# Patient Record
Sex: Male | Born: 1950 | Race: White | Hispanic: No | Marital: Married | Smoking: Current every day smoker
Health system: Southern US, Community
[De-identification: ages and names within clinical notes are randomized; demographics above are authoritative.]

## PROBLEM LIST (undated history)

## (undated) DIAGNOSIS — N410 Acute prostatitis: Principal | ICD-10-CM

## (undated) DIAGNOSIS — J189 Pneumonia, unspecified organism: Secondary | ICD-10-CM

## (undated) DIAGNOSIS — T4145XA Adverse effect of unspecified anesthetic, initial encounter: Secondary | ICD-10-CM

## (undated) DIAGNOSIS — T7840XA Allergy, unspecified, initial encounter: Secondary | ICD-10-CM

## (undated) DIAGNOSIS — Z800 Family history of malignant neoplasm of digestive organs: Secondary | ICD-10-CM

## (undated) DIAGNOSIS — C439 Malignant melanoma of skin, unspecified: Secondary | ICD-10-CM

## (undated) DIAGNOSIS — Z86010 Personal history of colonic polyps: Secondary | ICD-10-CM

## (undated) DIAGNOSIS — H9190 Unspecified hearing loss, unspecified ear: Secondary | ICD-10-CM

## (undated) DIAGNOSIS — N200 Calculus of kidney: Secondary | ICD-10-CM

## (undated) DIAGNOSIS — F17200 Nicotine dependence, unspecified, uncomplicated: Secondary | ICD-10-CM

## (undated) DIAGNOSIS — E785 Hyperlipidemia, unspecified: Secondary | ICD-10-CM

## (undated) DIAGNOSIS — T8859XA Other complications of anesthesia, initial encounter: Secondary | ICD-10-CM

## (undated) DIAGNOSIS — N182 Chronic kidney disease, stage 2 (mild): Secondary | ICD-10-CM

## (undated) DIAGNOSIS — Z122 Encounter for screening for malignant neoplasm of respiratory organs: Secondary | ICD-10-CM

## (undated) DIAGNOSIS — I2510 Atherosclerotic heart disease of native coronary artery without angina pectoris: Secondary | ICD-10-CM

## (undated) DIAGNOSIS — Z87442 Personal history of urinary calculi: Secondary | ICD-10-CM

## (undated) DIAGNOSIS — J449 Chronic obstructive pulmonary disease, unspecified: Secondary | ICD-10-CM

## (undated) DIAGNOSIS — IMO0001 Reserved for inherently not codable concepts without codable children: Secondary | ICD-10-CM

## (undated) DIAGNOSIS — K5792 Diverticulitis of intestine, part unspecified, without perforation or abscess without bleeding: Secondary | ICD-10-CM

## (undated) DIAGNOSIS — C4491 Basal cell carcinoma of skin, unspecified: Secondary | ICD-10-CM

## (undated) DIAGNOSIS — G971 Other reaction to spinal and lumbar puncture: Secondary | ICD-10-CM

## (undated) DIAGNOSIS — M1990 Unspecified osteoarthritis, unspecified site: Secondary | ICD-10-CM

## (undated) HISTORY — DX: Acute prostatitis: N41.0

## (undated) HISTORY — DX: Reserved for inherently not codable concepts without codable children: IMO0001

## (undated) HISTORY — DX: Diverticulitis of intestine, part unspecified, without perforation or abscess without bleeding: K57.92

## (undated) HISTORY — DX: Chronic kidney disease, stage 2 (mild): N18.2

## (undated) HISTORY — DX: Allergy, unspecified, initial encounter: T78.40XA

## (undated) HISTORY — DX: Atherosclerotic heart disease of native coronary artery without angina pectoris: I25.10

## (undated) HISTORY — DX: Family history of malignant neoplasm of digestive organs: Z80.0

## (undated) HISTORY — DX: Calculus of kidney: N20.0

## (undated) HISTORY — PX: COLONOSCOPY: SHX174

## (undated) HISTORY — DX: Nicotine dependence, unspecified, uncomplicated: F17.200

## (undated) HISTORY — DX: Chronic obstructive pulmonary disease, unspecified: J44.9

## (undated) HISTORY — DX: Encounter for screening for malignant neoplasm of respiratory organs: Z12.2

## (undated) HISTORY — DX: Personal history of colonic polyps: Z86.010

## (undated) HISTORY — PX: CERVICAL FUSION: SHX112

## (undated) HISTORY — DX: Malignant melanoma of skin, unspecified: C43.9

## (undated) HISTORY — PX: LITHOTRIPSY: SUR834

## (undated) HISTORY — DX: Unspecified hearing loss, unspecified ear: H91.90

## (undated) HISTORY — DX: Hyperlipidemia, unspecified: E78.5

## (undated) HISTORY — DX: Basal cell carcinoma of skin, unspecified: C44.91

## (undated) HISTORY — PX: JOINT REPLACEMENT: SHX530

---

## 1982-08-04 HISTORY — PX: INGUINAL HERNIA REPAIR: SUR1180

## 1985-08-04 HISTORY — PX: CHOLECYSTECTOMY: SHX55

## 1987-08-05 HISTORY — PX: NECK SURGERY: SHX720

## 1995-08-05 HISTORY — PX: KNEE SURGERY: SHX244

## 2003-08-05 HISTORY — PX: OTHER SURGICAL HISTORY: SHX169

## 2010-12-24 DIAGNOSIS — Z860101 Personal history of adenomatous and serrated colon polyps: Secondary | ICD-10-CM

## 2010-12-24 DIAGNOSIS — Z86010 Personal history of colonic polyps: Secondary | ICD-10-CM

## 2010-12-24 HISTORY — DX: Personal history of colonic polyps: Z86.010

## 2010-12-24 HISTORY — DX: Personal history of adenomatous and serrated colon polyps: Z86.0101

## 2013-08-04 HISTORY — PX: KNEE SURGERY: SHX244

## 2015-01-03 DIAGNOSIS — N410 Acute prostatitis: Secondary | ICD-10-CM

## 2015-01-03 HISTORY — DX: Acute prostatitis: N41.0

## 2015-01-31 ENCOUNTER — Ambulatory Visit: Payer: Self-pay | Admitting: Family Medicine

## 2015-01-31 ENCOUNTER — Ambulatory Visit (INDEPENDENT_AMBULATORY_CARE_PROVIDER_SITE_OTHER): Payer: Medicare Other | Admitting: Family Medicine

## 2015-01-31 ENCOUNTER — Encounter: Payer: Self-pay | Admitting: Family Medicine

## 2015-01-31 VITALS — BP 126/85 | HR 82 | Temp 98.0°F | Resp 16 | Ht 73.0 in | Wt 262.0 lb

## 2015-01-31 DIAGNOSIS — N419 Inflammatory disease of prostate, unspecified: Secondary | ICD-10-CM

## 2015-01-31 DIAGNOSIS — S3994XA Unspecified injury of external genitals, initial encounter: Secondary | ICD-10-CM

## 2015-01-31 DIAGNOSIS — R300 Dysuria: Secondary | ICD-10-CM

## 2015-01-31 DIAGNOSIS — N320 Bladder-neck obstruction: Secondary | ICD-10-CM | POA: Diagnosis not present

## 2015-01-31 DIAGNOSIS — S39848A Other specified injuries of external genitals, initial encounter: Secondary | ICD-10-CM | POA: Diagnosis not present

## 2015-01-31 LAB — POCT URINALYSIS DIPSTICK
Bilirubin, UA: NEGATIVE
GLUCOSE UA: NEGATIVE
Ketones, UA: NEGATIVE
Nitrite, UA: NEGATIVE
Protein, UA: NEGATIVE
Spec Grav, UA: 1.025
Urobilinogen, UA: 0.2
pH, UA: 6

## 2015-01-31 LAB — CBC WITH DIFFERENTIAL/PLATELET
Basophils Absolute: 0 10*3/uL (ref 0.0–0.1)
Basophils Relative: 0.3 % (ref 0.0–3.0)
EOS ABS: 0.3 10*3/uL (ref 0.0–0.7)
EOS PCT: 3.4 % (ref 0.0–5.0)
HEMATOCRIT: 44.3 % (ref 39.0–52.0)
Hemoglobin: 14.7 g/dL (ref 13.0–17.0)
LYMPHS ABS: 2.4 10*3/uL (ref 0.7–4.0)
LYMPHS PCT: 24.6 % (ref 12.0–46.0)
MCHC: 33.2 g/dL (ref 30.0–36.0)
MCV: 89.6 fl (ref 78.0–100.0)
MONOS PCT: 7.8 % (ref 3.0–12.0)
Monocytes Absolute: 0.7 10*3/uL (ref 0.1–1.0)
NEUTROS PCT: 63.9 % (ref 43.0–77.0)
Neutro Abs: 6.2 10*3/uL (ref 1.4–7.7)
PLATELETS: 273 10*3/uL (ref 150.0–400.0)
RBC: 4.94 Mil/uL (ref 4.22–5.81)
RDW: 13.6 % (ref 11.5–15.5)
WBC: 9.6 10*3/uL (ref 4.0–10.5)

## 2015-01-31 LAB — COMPREHENSIVE METABOLIC PANEL
ALBUMIN: 4.2 g/dL (ref 3.5–5.2)
ALT: 11 U/L (ref 0–53)
AST: 11 U/L (ref 0–37)
Alkaline Phosphatase: 56 U/L (ref 39–117)
BUN: 15 mg/dL (ref 6–23)
CALCIUM: 9.7 mg/dL (ref 8.4–10.5)
CHLORIDE: 104 meq/L (ref 96–112)
CO2: 31 mEq/L (ref 19–32)
Creatinine, Ser: 1.2 mg/dL (ref 0.40–1.50)
GFR: 64.51 mL/min (ref >60.00)
Glucose, Bld: 105 mg/dL — ABNORMAL HIGH (ref 70–99)
POTASSIUM: 4.6 meq/L (ref 3.5–5.1)
Sodium: 141 mEq/L (ref 135–145)
TOTAL PROTEIN: 6.8 g/dL (ref 6.0–8.3)
Total Bilirubin: 0.4 mg/dL (ref 0.2–1.2)

## 2015-01-31 MED ORDER — SULFAMETHOXAZOLE-TRIMETHOPRIM 800-160 MG PO TABS: 1.0000 | ORAL_TABLET | Freq: Two times a day (BID) | ORAL | Status: DC
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
## 2015-01-31 NOTE — Progress Notes (Signed)
Pre visit review using our clinic review tool, if applicable. No additional management support is needed unless otherwise documented below in the visit note. 

## 2015-02-01 LAB — URINE CULTURE
Colony Count: NO GROWTH
ORGANISM ID, BACTERIA: NO GROWTH

## 2015-02-13 DIAGNOSIS — Z125 Encounter for screening for malignant neoplasm of prostate: Secondary | ICD-10-CM | POA: Diagnosis not present

## 2015-02-13 DIAGNOSIS — N400 Enlarged prostate without lower urinary tract symptoms: Secondary | ICD-10-CM | POA: Diagnosis not present

## 2015-02-13 DIAGNOSIS — Z87442 Personal history of urinary calculi: Secondary | ICD-10-CM | POA: Diagnosis not present

## 2015-02-13 DIAGNOSIS — R102 Pelvic and perineal pain: Secondary | ICD-10-CM | POA: Diagnosis not present

## 2015-02-14 ENCOUNTER — Encounter: Payer: Self-pay | Admitting: Family Medicine

## 2015-02-14 ENCOUNTER — Ambulatory Visit (INDEPENDENT_AMBULATORY_CARE_PROVIDER_SITE_OTHER): Payer: Medicare Other | Admitting: Family Medicine

## 2015-02-14 VITALS — BP 112/78 | HR 91 | Temp 97.5°F | Resp 16 | Ht 73.0 in | Wt 259.0 lb

## 2015-02-14 DIAGNOSIS — N410 Acute prostatitis: Secondary | ICD-10-CM | POA: Diagnosis not present

## 2015-02-14 NOTE — Progress Notes (Signed)
OFFICE NOTE  02/14/2015  CC:  Chief Complaint  Patient presents with  . Follow-up    2 week f/u. Pt is fastng.    HPI: Patient is a 64 y.o. Caucasian male who is here for 2 week f/u for issues with prostatitis with bladder outlet obstruction sx's s/p penile trauma. I started him on abx  (bactrim x 2 wks) and referred him to urology last visit.  Urine clx came back showing no growth. He went to see Dr. Karsten Ro, exam showed everything to be fine. Pt feels much better--says he feels back to normal.  Sounds like PSA was done.  Pt says he began to feel better about 3d after getting on abx.  Says DRE/prostate didn't hurt when Dr. Karsten Ro examined him.  Pertinent PMH:  Past medical, surgical, social, and family history reviewed and no changes are noted since last office visit.  MEDS:  Outpatient Prescriptions Prior to Visit  Medication Sig Dispense Refill  . sulfamethoxazole-trimethoprim (BACTRIM DS,SEPTRA DS) 800-160 MG per tablet Take 1 tablet by mouth 2 (two) times daily. 28 tablet 0   No facility-administered medications prior to visit.    PE: Blood pressure 112/78, pulse 91, temperature 97.5 F (36.4 C), temperature source Oral, resp. rate 16, height 6\' 1"  (1.854 m), weight 259 lb (117.482 kg), SpO2 94 %. Gen: Alert, well appearing.  Patient is oriented to person, place, time, and situation. No further exam today   IMPRESSION AND PLAN:  Acute prostatitis, resolving appropriately on bactrim. Finish abx.  Urology consult was helpful, pt reassured.  An After Visit Summary was printed and given to the patient.   FOLLOW UP: pt needs welcome to medicare visit at his convenience before turning 65.

## 2015-02-14 NOTE — Progress Notes (Signed)
Pre visit review using our clinic review tool, if applicable. No additional management support is needed unless otherwise documented below in the visit note. 

## 2015-02-27 ENCOUNTER — Encounter: Payer: Self-pay | Admitting: Family Medicine

## 2015-02-28 ENCOUNTER — Ambulatory Visit: Payer: Self-pay | Admitting: Family Medicine

## 2015-05-03 ENCOUNTER — Ambulatory Visit (INDEPENDENT_AMBULATORY_CARE_PROVIDER_SITE_OTHER): Payer: Medicare Other | Admitting: Family Medicine

## 2015-05-03 ENCOUNTER — Encounter: Payer: Self-pay | Admitting: Family Medicine

## 2015-05-03 VITALS — BP 131/90 | HR 90 | Temp 97.9°F | Resp 16 | Ht 73.0 in | Wt 265.0 lb

## 2015-05-03 DIAGNOSIS — Z85820 Personal history of malignant melanoma of skin: Secondary | ICD-10-CM

## 2015-05-03 DIAGNOSIS — D1720 Benign lipomatous neoplasm of skin and subcutaneous tissue of unspecified limb: Secondary | ICD-10-CM | POA: Diagnosis not present

## 2015-05-03 DIAGNOSIS — Z0000 Encounter for general adult medical examination without abnormal findings: Secondary | ICD-10-CM

## 2015-05-03 DIAGNOSIS — F17200 Nicotine dependence, unspecified, uncomplicated: Secondary | ICD-10-CM

## 2015-05-03 DIAGNOSIS — Z136 Encounter for screening for cardiovascular disorders: Secondary | ICD-10-CM

## 2015-05-03 DIAGNOSIS — F17210 Nicotine dependence, cigarettes, uncomplicated: Secondary | ICD-10-CM

## 2015-05-03 DIAGNOSIS — Z131 Encounter for screening for diabetes mellitus: Secondary | ICD-10-CM

## 2015-05-03 NOTE — Progress Notes (Signed)
The patient is here for "Welcome to Medicare" examination and management of other chronic and acute problems.   The risk factors are reflected in the social history.  The roster of all physicians providing medical care to patient - is listed in the Snapshot section of the chart.  Activities of daily living:  The patient is 100% inedpendent in all ADLs: dressing, toileting, feeding as well as independent mobility  Home safety : The patient has smoke detectors in the home. They wear seatbelts.  Firearms are present in the home, kept in a safe fashion. There is no violence in the home.   There is no risks for hepatitis, STDs or HIV. There is no   history of blood transfusion. They have no travel history to infectious disease endemic areas of the world.  The patient has not seen their dentist in the last six month but this is planned. They have seen their eye doctor in the last year. He will be establishing with a local optometrist soon. He has hearing difficulty and has not had audiologic testing in the last year.  Last audiology testing was 2 yrs ago.  Wears hearing aids.  They do not  have excessive sun exposure.  Discussed the need for sun protection: hats, long sleeves and use of sunscreen if there is significant sun exposure.   Diet: the importance of a healthy diet is discussed. They do have a fairly healthy diet but admits he could do better.  The patient does not have a regular exercise program.  The benefits of regular aerobic exercise were discussed.  Depression screen: there are no signs or vegative symptoms of depression- irritability, change in appetite, anhedonia, sadness/tearfullness.  Cognitive assessment: the patient manages all their financial and personal affairs and is actively engaged.  They could relate day,date,year and events; recalled 3/3 objects at 3 minutes; performed clock-face test normally.  The following portions of the patient's history were reviewed and updated as  appropriate: allergies, current medications, past family history, past medical history,  past surgical history, past social history  and problem list.  Vision, hearing, body mass index were assessed and reviewed.  BMI is 35.  During the course of the visit the patient was educated and counseled about appropriate screening and preventive services including : fall prevention , diabetes screening, nutrition counseling, colorectal cancer screening, and recommended immunizations.  A written screening schedule was given to patient: 1) Pt declined ALL vaccines today, citing past hx of an illness following a vaccine so he will never get another vaccine again. 2) Spent 5 min today for tob cessation counseling: pt to pick a quit date and go "cold [place]". Declined trial of chantix--hx of bad dreams on this med.  Declined trial of wellbutrin but said he would call back if "cold [place]" didn't work.  He says he doesn't feel nicotine withdrawal sx's so nicotine replacement would not be helpful for him. 3) Lung cancer screening: pt with 50 pack-yr history, smoking currently. Discussed pros/cons of low dose CT chest screening for lung cancer and he wants to proceed with this screening test: ordered for [facility] radiology. 4) Cardiovascular screening blood work: [place] today. 5) Diabetes screening: CMET today. 6) Pt will be getting glaucoma screening via his upcoming eye exam that he'll be arranging. 7) Pt has had prostate cancer screening this year and is also UTD on colon cancer screening.  Additional info today: pt asked me to look at a focal area of swelling on right  ankle that he has noted gradually enlarging over the last year.  Feels slight pressure in the area but no pain.  No injury to the ankle/foot.  I could palpate a focal soft tissue mass about 3-4 cm in diameter located on right ankle just medial to the lateral malleolus, consistent with a lipoma.  Reassured pt today, recommended watchful waiting  approach for this benign finding.  Patient also requested referral to dermatologist for general skin survey due to his personal history of melanoma, so I referred him to Dr. Denna Haggard today.

## 2015-05-03 NOTE — Progress Notes (Signed)
Pre visit review using our clinic review tool, if applicable. No additional management support is needed unless otherwise documented below in the visit note. 

## 2015-05-07 ENCOUNTER — Ambulatory Visit (HOSPITAL_COMMUNITY)
Admission: RE | Admit: 2015-05-07 | Discharge: 2015-05-07 | Disposition: A | Discharge status: Home / Self Care | Payer: Medicare Other | Source: Ambulatory Visit | Attending: Family Medicine | Admitting: Family Medicine

## 2015-05-07 DIAGNOSIS — J449 Chronic obstructive pulmonary disease, unspecified: Secondary | ICD-10-CM

## 2015-05-07 DIAGNOSIS — F17200 Nicotine dependence, unspecified, uncomplicated: Secondary | ICD-10-CM

## 2015-05-07 DIAGNOSIS — J439 Emphysema, unspecified: Secondary | ICD-10-CM | POA: Insufficient documentation

## 2015-05-07 DIAGNOSIS — Z122 Encounter for screening for malignant neoplasm of respiratory organs: Secondary | ICD-10-CM | POA: Insufficient documentation

## 2015-05-07 DIAGNOSIS — I700 Atherosclerosis of aorta: Secondary | ICD-10-CM | POA: Diagnosis not present

## 2015-05-07 DIAGNOSIS — Z87891 Personal history of nicotine dependence: Secondary | ICD-10-CM | POA: Diagnosis not present

## 2015-05-07 DIAGNOSIS — F17210 Nicotine dependence, cigarettes, uncomplicated: Secondary | ICD-10-CM | POA: Insufficient documentation

## 2015-05-07 HISTORY — DX: Chronic obstructive pulmonary disease, unspecified: J44.9

## 2015-05-08 ENCOUNTER — Encounter: Payer: Self-pay | Admitting: Family Medicine

## 2015-06-06 ENCOUNTER — Other Ambulatory Visit: Payer: Medicare Other

## 2015-06-26 ENCOUNTER — Ambulatory Visit (INDEPENDENT_AMBULATORY_CARE_PROVIDER_SITE_OTHER): Payer: Medicare Other | Admitting: Family Medicine

## 2015-06-26 ENCOUNTER — Encounter: Payer: Self-pay | Admitting: Family Medicine

## 2015-06-26 ENCOUNTER — Ambulatory Visit (HOSPITAL_BASED_OUTPATIENT_CLINIC_OR_DEPARTMENT_OTHER)
Admission: RE | Admit: 2015-06-26 | Discharge: 2015-06-26 | Disposition: A | Discharge status: Home / Self Care | Payer: Medicare Other | Source: Ambulatory Visit | Attending: Family Medicine | Admitting: Family Medicine

## 2015-06-26 VITALS — BP 142/91 | HR 73 | Temp 98.1°F | Resp 16 | Ht 73.0 in | Wt 271.0 lb

## 2015-06-26 DIAGNOSIS — R918 Other nonspecific abnormal finding of lung field: Secondary | ICD-10-CM | POA: Insufficient documentation

## 2015-06-26 DIAGNOSIS — R05 Cough: Secondary | ICD-10-CM

## 2015-06-26 DIAGNOSIS — J449 Chronic obstructive pulmonary disease, unspecified: Secondary | ICD-10-CM

## 2015-06-26 DIAGNOSIS — J441 Chronic obstructive pulmonary disease with (acute) exacerbation: Secondary | ICD-10-CM

## 2015-06-26 DIAGNOSIS — R059 Cough, unspecified: Secondary | ICD-10-CM

## 2015-06-26 DIAGNOSIS — R509 Fever, unspecified: Secondary | ICD-10-CM | POA: Insufficient documentation

## 2015-06-26 DIAGNOSIS — J189 Pneumonia, unspecified organism: Secondary | ICD-10-CM

## 2015-06-26 HISTORY — DX: Pneumonia, unspecified organism: J18.9

## 2015-06-26 MED ORDER — AZITHROMYCIN 250 MG PO TABS: ORAL_TABLET | ORAL | Status: DC

## 2015-06-26 MED ORDER — PREDNISONE 20 MG PO TABS: ORAL_TABLET | ORAL | Status: DC

## 2015-06-26 MED ORDER — HYDROCODONE-HOMATROPINE 5-1.5 MG/5ML PO SYRP: ORAL_SOLUTION | ORAL | Status: DC

## 2015-06-26 MED ORDER — IPRATROPIUM BROMIDE 0.02 % IN SOLN
0.5000 mg | Freq: Once | RESPIRATORY_TRACT | Status: AC
  Administered 2015-06-26: 0.5 mg via RESPIRATORY_TRACT

## 2015-06-26 MED ORDER — ALBUTEROL SULFATE (2.5 MG/3ML) 0.083% IN NEBU
2.5000 mg | INHALATION_SOLUTION | Freq: Once | RESPIRATORY_TRACT | Status: AC
  Administered 2015-06-26: 2.5 mg via RESPIRATORY_TRACT

## 2015-06-26 MED ORDER — ALBUTEROL SULFATE HFA 108 (90 BASE) MCG/ACT IN AERS: 2.0000 | INHALATION_SPRAY | Freq: Four times a day (QID) | RESPIRATORY_TRACT | Status: DC | PRN

## 2015-06-26 NOTE — Progress Notes (Signed)
OFFICE VISIT  06/26/2015   CC:  Chief Complaint  Patient presents with  . Cough    x 2 months   HPI:    Patient is a 64 y.o. Caucasian male who presents for cough. Two months or so lots of cough going on about 2 mo, productive of some greenish,thickish mucous. Feels some SOB when lying supine, feels like choking to death.  Question of wheezing/chest tighness feeling. Has cut back on smoking, esp in light of the current sx's.  No hemoptysis.  Hurting in R scapular region: can't rest/get comfortable at night. Alka seltzer cold plus: no help.  Past Medical History  Diagnosis Date  . Diverticulitis   . Family history of colon cancer in mother   . Kidney stones   . Acute prostatitis 01/2015  . History of adenomatous polyp of colon 12/24/10  . Melanoma ([facility])     R posterolateral neck and R nasal ala  . Hearing impairment     hearing aids  . Tobacco dependence     CT chest for lung ca screning 04/2015 showed benign findings; 1 yr repeat recommended  . COPD (chronic obstructive pulmonary disease) ([facility]) 05/07/15    Long time smoker + COPD changes noted on lung ca screening CT    Past Surgical History  Procedure Laterality Date  . Cholecystectomy  1987  . Knee surgery Left 2015    cartilage repair  . Knee surgery Right 1997    "          "  . Neck surgery  1989  . Thumb surgery Bilateral 2005    and 2006  . Inguinal hernia repair  1984  . Colonoscopy  12/24/10; 2014    Multiple TCSs: pt on 5 yr recall, next due 2019.    No outpatient prescriptions prior to visit.   No facility-administered medications prior to visit.    Allergies  Allergen Reactions  . Codeine Hives  . Valium [Diazepam] Anaphylaxis   ROS As per HPI  PE: Blood pressure 142/91, pulse 73, temperature 98.1 F (36.7 C), temperature source Oral, resp. rate 16, height 6\' 1"  (1.854 m), weight 271 lb (122.925 kg), SpO2 96 %. Gen: Alert, well appearing.  Patient is oriented to person, place, time, and  situation. ENT: Ears: EACs clear, normal epithelium.  TMs with good light reflex and landmarks bilaterally.  Eyes: no injection, icteris, swelling, or exudate.  EOMI, PERRLA. Nose: no drainage or turbinate edema/swelling.  No injection or focal lesion.  Mouth: lips without lesion/swelling.  Oral mucosa pink and moist.  Dentition intact and without obvious caries or gingival swelling.  Oropharynx without erythema, exudate, or swelling.  Neck - No masses or thyromegaly or limitation in range of motion CV: RRR, no m/r/g.   LUNGS: soft exp wheezing, mildly prolonged exp phase, nonlabored resps.  No crackles or asymmetric BS.  LABS:  none  IMPRESSION AND PLAN:  Acute bronchitis vs bronchopneumonia; pt has underling COPD. Albut/atrov neb in office today: subj and obj improvement noted. Prednisone 40mg  qd x 5d, then 20mg  qd x 5d, then 10mg  qdx 6d. Z-pack. Hycodan syrup: 1-2 tsp tid prn, #22ml. Recommended mucinex DM or robitussin DM for daytime cough use. Check CXR-ordered.  An After Visit Summary was printed and given to the patient.   FOLLOW UP: Return for 10-14d f/u bronchitis.

## 2015-06-26 NOTE — Progress Notes (Signed)
Pre visit review using our clinic review tool, if applicable. No additional management support is needed unless otherwise documented below in the visit note. 

## 2015-06-27 ENCOUNTER — Telehealth: Payer: Self-pay | Admitting: Family Medicine

## 2015-06-27 ENCOUNTER — Encounter (HOSPITAL_BASED_OUTPATIENT_CLINIC_OR_DEPARTMENT_OTHER): Payer: Self-pay

## 2015-06-27 NOTE — Telephone Encounter (Signed)
Pt would like to know x-ray results from yesterday. Please call at [phone].

## 2015-06-27 NOTE — Telephone Encounter (Signed)
Pt advised and voiced understanding.   

## 2015-07-10 ENCOUNTER — Ambulatory Visit (INDEPENDENT_AMBULATORY_CARE_PROVIDER_SITE_OTHER): Payer: Medicare Other | Admitting: Family Medicine

## 2015-07-10 ENCOUNTER — Encounter: Payer: Self-pay | Admitting: Family Medicine

## 2015-07-10 VITALS — BP 122/80 | HR 77 | Temp 97.5°F | Resp 16 | Ht 73.0 in | Wt 261.0 lb

## 2015-07-10 DIAGNOSIS — M791 Myalgia, unspecified site: Secondary | ICD-10-CM

## 2015-07-10 DIAGNOSIS — J180 Bronchopneumonia, unspecified organism: Secondary | ICD-10-CM | POA: Diagnosis not present

## 2015-07-10 DIAGNOSIS — J441 Chronic obstructive pulmonary disease with (acute) exacerbation: Secondary | ICD-10-CM | POA: Diagnosis not present

## 2015-07-10 MED ORDER — METHYLPREDNISOLONE ACETATE 40 MG/ML IJ SUSP
40.0000 mg | Freq: Once | INTRAMUSCULAR | Status: AC
  Administered 2015-07-10: 40 mg via INTRAMUSCULAR

## 2015-07-10 MED ORDER — METHYLPREDNISOLONE ACETATE 40 MG/ML IJ SUSP: 40.0000 mg | Freq: Once | INTRAMUSCULAR | Status: DC

## 2015-07-10 NOTE — Addendum Note (Signed)
Addended by: Lanae Crumbly on: 07/10/2015 12:07 PM   Modules accepted: Orders

## 2015-07-10 NOTE — Progress Notes (Signed)
Pre visit review using our clinic review tool, if applicable. No additional management support is needed unless otherwise documented below in the visit note. 

## 2015-07-10 NOTE — Progress Notes (Signed)
OFFICE VISIT  07/10/2015   CC:  Chief Complaint  Patient presents with  . Follow-up    Bronchitis     HPI:    Patient is a 64 y.o. Caucasian male who presents for 2 week f/u acute exacerbation of COPD with bibasilar LL infiltrates vs atelectasis noted on CXR.  He took Z pack and is on the tail end of a prednisone taper, has albuterol and hycodan as well.  Feels 80% improved!  However, describes ongoing severe pain in R infrascapular region, worse with movement of torso and with deep breaths.  No skin rash.  Says it feels focally swollen in the area at times.  Past Medical History  Diagnosis Date  . Diverticulitis   . Family history of colon cancer in mother   . Kidney stones   . Acute prostatitis 01/2015  . History of adenomatous polyp of colon 12/24/10  . Melanoma ([facility])     R posterolateral neck and R nasal ala  . Hearing impairment     hearing aids  . Tobacco dependence     CT chest for lung ca screning 04/2015 showed benign findings; 1 yr repeat recommended  . COPD (chronic obstructive pulmonary disease) ([facility]) 05/07/15    Long time smoker + COPD changes noted on lung ca screening CT  . CAP (community acquired pneumonia) 06/26/2015    Past Surgical History  Procedure Laterality Date  . Cholecystectomy  1987  . Knee surgery Left 2015    cartilage repair  . Knee surgery Right 1997    "          "  . Neck surgery  1989  . Thumb surgery Bilateral 2005    and 2006  . Inguinal hernia repair  1984  . Colonoscopy  12/24/10; 2014    Multiple TCSs: pt on 5 yr recall, next due 2019.    Outpatient Prescriptions Prior to Visit  Medication Sig Dispense Refill  . albuterol (PROAIR HFA) 108 (90 BASE) MCG/ACT inhaler Inhale 2 puffs into the lungs every 6 (six) hours as needed for wheezing or shortness of breath. 1 Inhaler 1  . HYDROcodone-homatropine (HYCODAN) 5-1.5 MG/5ML syrup 1-2 tsp po tid prn cough 240 mL 0  . predniSONE (DELTASONE) 20 MG tablet 2 tabs po qd x 5d, then 1 tab  po qd x 5d, then 1/2 tab po qd x 6d 18 tablet 0  . azithromycin (ZITHROMAX) 250 MG tablet 2 tabs po qd x 1d, then 1 tab po qd x 4d 6 tablet 0   No facility-administered medications prior to visit.    Allergies  Allergen Reactions  . Codeine Hives  . Valium [Diazepam] Anaphylaxis    ROS As per HPI  PE: Blood pressure 122/80, pulse 77, temperature 97.5 F (36.4 C), temperature source Oral, resp. rate 16, height 6\' 1"  (1.854 m), weight 261 lb (118.389 kg), SpO2 95 %. Gen: Alert, well appearing.  Patient is oriented to person, place, time, and situation. CV: RRR, distant S1 and S2, no m/r/g LUNGS: mildly diminished BS in all areas, exp phase not prolonged, no wheezing or crackles.  No post exhalation coughing, no asymmetric breath sounds, nonlabored resps. BACK: R infrascapular region with 3 focal soft tissue tender points  LABS:  none  IMPRESSION AND PLAN:  1) CoPD exac, bronchopneumonia: much improved.  Finish systemic steroids. Encouraged smoking cessation. Albut q6h prn. F/u to discuss chronic COPD w/u mgmt in 2 mo.  2) Myofascial trigger points, R infrascapular region x  3. Injected all 3 of these today. A trigger point injection was performed at the sites (x 3) of maximal tenderness using 1/2 ml of 1% plain Lidocaine and 1/2 ml of 40mg /ml depo medrol at each site. This was well tolerated, no immediate complications.  An After Visit Summary was printed and given to the patient.  FOLLOW UP: Return in about 2 months (around 09/10/2015) for CoPD f/u (30 min).

## 2015-07-18 ENCOUNTER — Ambulatory Visit (INDEPENDENT_AMBULATORY_CARE_PROVIDER_SITE_OTHER): Payer: Medicare Other | Admitting: Family Medicine

## 2015-07-18 ENCOUNTER — Encounter: Payer: Self-pay | Admitting: Family Medicine

## 2015-07-18 ENCOUNTER — Other Ambulatory Visit: Payer: Self-pay | Admitting: Family Medicine

## 2015-07-18 ENCOUNTER — Ambulatory Visit (HOSPITAL_COMMUNITY)
Admission: RE | Admit: 2015-07-18 | Discharge: 2015-07-18 | Disposition: A | Discharge status: Home / Self Care | Payer: Medicare Other | Source: Ambulatory Visit | Attending: Family Medicine | Admitting: Family Medicine

## 2015-07-18 VITALS — BP 136/92 | HR 78 | Temp 97.7°F | Resp 16 | Ht 73.0 in | Wt 267.5 lb

## 2015-07-18 DIAGNOSIS — M549 Dorsalgia, unspecified: Secondary | ICD-10-CM

## 2015-07-18 DIAGNOSIS — J9811 Atelectasis: Secondary | ICD-10-CM | POA: Insufficient documentation

## 2015-07-18 DIAGNOSIS — R3129 Other microscopic hematuria: Secondary | ICD-10-CM

## 2015-07-18 DIAGNOSIS — N182 Chronic kidney disease, stage 2 (mild): Secondary | ICD-10-CM | POA: Diagnosis not present

## 2015-07-18 DIAGNOSIS — R0781 Pleurodynia: Secondary | ICD-10-CM | POA: Diagnosis not present

## 2015-07-18 DIAGNOSIS — R079 Chest pain, unspecified: Secondary | ICD-10-CM | POA: Insufficient documentation

## 2015-07-18 DIAGNOSIS — M546 Pain in thoracic spine: Secondary | ICD-10-CM | POA: Diagnosis not present

## 2015-07-18 LAB — CBC WITH DIFFERENTIAL/PLATELET
BASOS PCT: 0.4 % (ref 0.0–3.0)
Basophils Absolute: 0 10*3/uL (ref 0.0–0.1)
EOS ABS: 0.2 10*3/uL (ref 0.0–0.7)
Eosinophils Relative: 2.3 % (ref 0.0–5.0)
HEMATOCRIT: 47 % (ref 39.0–52.0)
Hemoglobin: 15.4 g/dL (ref 13.0–17.0)
Lymphocytes Relative: 24.1 % (ref 12.0–46.0)
Lymphs Abs: 2 10*3/uL (ref 0.7–4.0)
MCHC: 32.9 g/dL (ref 30.0–36.0)
MCV: 90.2 fl (ref 78.0–100.0)
MONO ABS: 0.6 10*3/uL (ref 0.1–1.0)
Monocytes Relative: 7.1 % (ref 3.0–12.0)
NEUTROS ABS: 5.4 10*3/uL (ref 1.4–7.7)
Neutrophils Relative %: 66.1 % (ref 43.0–77.0)
PLATELETS: 273 10*3/uL (ref 150.0–400.0)
RBC: 5.21 Mil/uL (ref 4.22–5.81)
RDW: 14.1 % (ref 11.5–15.5)
WBC: 8.2 10*3/uL (ref 4.0–10.5)

## 2015-07-18 LAB — POCT URINALYSIS DIPSTICK
BILIRUBIN UA: NEGATIVE
GLUCOSE UA: NEGATIVE
Ketones, UA: NEGATIVE
Leukocytes, UA: NEGATIVE
Nitrite, UA: NEGATIVE
Protein, UA: NEGATIVE
SPEC GRAV UA: 1.025
Urobilinogen, UA: 0.2
pH, UA: 7

## 2015-07-18 LAB — URINALYSIS, ROUTINE W REFLEX MICROSCOPIC
BILIRUBIN URINE: NEGATIVE
Ketones, ur: NEGATIVE
LEUKOCYTES UA: NEGATIVE
Nitrite: NEGATIVE
SPECIFIC GRAVITY, URINE: 1.015 (ref 1.000–1.030)
TOTAL PROTEIN, URINE-UPE24: NEGATIVE
Urine Glucose: NEGATIVE
Urobilinogen, UA: 0.2 (ref 0.0–1.0)
pH: 6.5 (ref 5.0–8.0)

## 2015-07-18 LAB — BASIC METABOLIC PANEL
BUN: 16 mg/dL (ref 6–23)
CALCIUM: 9.6 mg/dL (ref 8.4–10.5)
CO2: 30 mEq/L (ref 19–32)
Chloride: 105 mEq/L (ref 96–112)
Creatinine, Ser: 1.27 mg/dL (ref 0.40–1.50)
GFR: 60.34 mL/min (ref >60.00)
Glucose, Bld: 69 mg/dL — ABNORMAL LOW (ref 70–99)
POTASSIUM: 4.6 meq/L (ref 3.5–5.1)
Sodium: 142 mEq/L (ref 135–145)

## 2015-07-18 LAB — SEDIMENTATION RATE: SED RATE: 7 mm/h (ref 0–22)

## 2015-07-18 MED ORDER — OXYCODONE-ACETAMINOPHEN 5-325 MG PO TABS: 1.0000 | ORAL_TABLET | Freq: Four times a day (QID) | ORAL | Status: DC | PRN

## 2015-07-18 NOTE — Progress Notes (Signed)
Pre visit review using our clinic review tool, if applicable. No additional management support is needed unless otherwise documented below in the visit note. 

## 2015-07-18 NOTE — Progress Notes (Addendum)
OFFICE VISIT  07/18/2015   CC:  Chief Complaint  Patient presents with  . Back Pain   HPI:    Patient is a 64 y.o. Caucasian male who presents for ongoing pain in R mid back region. On 07/10/15 I injected 3 trigger points in R infrascapular region.  He has been hurting several weeks in this region, just seems to keep getting worse.  Started at the time he was having a lot of coughing with acute exac of COPD/pneumonia.  No fever.  Coughing much improved, breathing is fine.  No CP.   Hurts a lot to move/lie on that side.  Sometimes when he walks he feels a bit of radiation of the pain around R flank into R groin.  No gross hematuria noted.    Past Medical History  Diagnosis Date  . Diverticulitis   . Family history of colon cancer in mother   . Kidney stones   . Acute prostatitis 01/2015  . History of adenomatous polyp of colon 12/24/10  . Melanoma ([facility])     R posterolateral neck and R nasal ala  . Hearing impairment     hearing aids  . Tobacco dependence     CT chest for lung ca screning 04/2015 showed benign findings; 1 yr repeat recommended  . COPD (chronic obstructive pulmonary disease) ([facility]) 05/07/15    Long time smoker + COPD changes noted on lung ca screening CT  . CAP (community acquired pneumonia) 06/26/2015    Past Surgical History  Procedure Laterality Date  . Cholecystectomy  1987  . Knee surgery Left 2015    cartilage repair  . Knee surgery Right 1997    "          "  . Neck surgery  1989  . Thumb surgery Bilateral 2005    and 2006  . Inguinal hernia repair  1984  . Colonoscopy  12/24/10; 2014    Multiple TCSs: pt on 5 yr recall, next due 2019.    Outpatient Prescriptions Prior to Visit  Medication Sig Dispense Refill  . albuterol (PROAIR HFA) 108 (90 BASE) MCG/ACT inhaler Inhale 2 puffs into the lungs every 6 (six) hours as needed for wheezing or shortness of breath. 1 Inhaler 1  . predniSONE (DELTASONE) 20 MG tablet 2 tabs po qd x 5d, then 1 tab po qd x 5d,  then 1/2 tab po qd x 6d 18 tablet 0  . HYDROcodone-homatropine (HYCODAN) 5-1.5 MG/5ML syrup 1-2 tsp po tid prn cough (Patient not taking: Reported on 07/18/2015) 240 mL 0   No facility-administered medications prior to visit.    Allergies  Allergen Reactions  . Codeine Hives  . Valium [Diazepam] Anaphylaxis    ROS As per HPI  PE: Blood pressure 136/92, pulse 78, temperature 97.7 F (36.5 C), temperature source Oral, resp. rate 16, height 6\' 1"  (1.854 m), weight 267 lb 8 oz (121.337 kg), SpO2 96 %. Gen: Alert, well appearing.  Patient is oriented to person, place, time, and situation. CV: RRR, no m/r/g.   LUNGS: CTA bilat, nonlabored resps, good aeration in all lung fields. R mid back with TTP in inferomedial border of scapula region.  No rash.  LABS:   CC UA today showed trace intact blood, otherwise normal.    Chemistry      Component Value Date/Time   NA 141 01/31/2015 1401   K 4.6 01/31/2015 1401   CL 104 01/31/2015 1401   CO2 31 01/31/2015 1401   BUN  15 01/31/2015 1401   CREATININE 1.20 01/31/2015 1401      Component Value Date/Time   CALCIUM 9.7 01/31/2015 1401   ALKPHOS 56 01/31/2015 1401   AST 11 01/31/2015 1401   ALT 11 01/31/2015 1401   BILITOT 0.4 01/31/2015 1401      IMPRESSION AND PLAN:  1) Persistent right mid back pain; no improvement with tender point injections 8d/a. Will check x-ray for rib fracture. Urine today here with trace intact blood: will send this for UA with reflex microscopy and urine culture for completeness.  If rib fx ruled out then will check noncontrast CT abd/pelv to r/o obstructing kidney stone. Check CBC, BMET, ESR today. Percocet 5/325, 1-2 q6h prn, #30. Therapeutic expectations and side effect profile of medication discussed today.  Patient's questions answered.  An After Visit Summary was printed and given to the patient.  FOLLOW UP: Return for to be determined based on results of w/u.  ADDENDUM: 07/18/15: Pt's repeat  UA with reflex micro again showed trace intact blood but microscopy showed 0-2 RBCs/HPF. CBC normal, BMET showed normal lytes and no change in baseline CRI (GFR low 60s).  His rib film was NEG for fx.  I have ordered a CT abd/pelv w/out contrast to eval for kidney stone on R that may be the cause of his pain.--PM

## 2015-07-19 LAB — URINE CULTURE
Colony Count: NO GROWTH
ORGANISM ID, BACTERIA: NO GROWTH

## 2015-07-20 ENCOUNTER — Other Ambulatory Visit: Payer: Self-pay | Admitting: Family Medicine

## 2015-07-20 ENCOUNTER — Ambulatory Visit (HOSPITAL_COMMUNITY)
Admission: RE | Admit: 2015-07-20 | Discharge: 2015-07-20 | Disposition: A | Discharge status: Home / Self Care | Payer: Medicare Other | Source: Ambulatory Visit | Attending: Family Medicine | Admitting: Family Medicine

## 2015-07-20 DIAGNOSIS — R3129 Other microscopic hematuria: Secondary | ICD-10-CM

## 2015-07-20 DIAGNOSIS — R109 Unspecified abdominal pain: Secondary | ICD-10-CM | POA: Diagnosis not present

## 2015-07-20 DIAGNOSIS — N200 Calculus of kidney: Secondary | ICD-10-CM | POA: Insufficient documentation

## 2015-07-20 DIAGNOSIS — M549 Dorsalgia, unspecified: Secondary | ICD-10-CM

## 2015-07-25 ENCOUNTER — Ambulatory Visit: Payer: Medicare Other | Admitting: Family Medicine

## 2015-07-31 ENCOUNTER — Other Ambulatory Visit: Payer: Self-pay | Admitting: *Deleted

## 2015-07-31 MED ORDER — OXYCODONE-ACETAMINOPHEN 5-325 MG PO TABS: 1.0000 | ORAL_TABLET | Freq: Four times a day (QID) | ORAL | Status: DC | PRN

## 2015-07-31 NOTE — Telephone Encounter (Signed)
He was supposed to have seen Dr. Barbaraann Barthel, sports med MD---I think this is what he means by "PT". He needs to call Dr. Ericka Pontiff office and schedule appt and if he has any trouble making appt he should call our office. I'll print percocet rx as per pt's request.-thx

## 2015-07-31 NOTE — Telephone Encounter (Signed)
Pt LMOM on 07/31/15 at 12:36pm requesting refill but did not say which medication he wanted refilled. I spoke to pt and he stated that he would like the pain medication refilled. He stated that he cancelled the apt with PT because he was feeling better but has since re-injured his back and now is going to call and reschedule apt with PT. He stated that he is only taking 6 a day 2 in the morning, 2 around 3pm and 2 at bedtime.   RF request for oxycodone/apap LOV: 07/18/15 Next ov: 09/10/15 Last written: 07/18/15 #30 w/ 0RF  Please advise. Thanks.

## 2015-08-01 NOTE — Telephone Encounter (Signed)
Pt advised and voiced understanding.  He stated that he is going to call Dr. Valeria Batman office to reschedule. Rx put up front for p/u.

## 2015-08-05 DIAGNOSIS — C4491 Basal cell carcinoma of skin, unspecified: Secondary | ICD-10-CM

## 2015-08-05 HISTORY — DX: Basal cell carcinoma of skin, unspecified: C44.91

## 2015-08-09 DIAGNOSIS — N200 Calculus of kidney: Secondary | ICD-10-CM | POA: Diagnosis not present

## 2015-08-09 DIAGNOSIS — N400 Enlarged prostate without lower urinary tract symptoms: Secondary | ICD-10-CM | POA: Diagnosis not present

## 2015-08-09 DIAGNOSIS — Z0000 Encounter for general adult medical examination without abnormal findings: Secondary | ICD-10-CM | POA: Diagnosis not present

## 2015-08-10 ENCOUNTER — Ambulatory Visit (INDEPENDENT_AMBULATORY_CARE_PROVIDER_SITE_OTHER): Payer: Medicare Other | Admitting: Family Medicine

## 2015-08-10 ENCOUNTER — Encounter: Payer: Self-pay | Admitting: Family Medicine

## 2015-08-10 VITALS — BP 137/101 | HR 81 | Ht 73.0 in | Wt 260.0 lb

## 2015-08-10 DIAGNOSIS — M549 Dorsalgia, unspecified: Secondary | ICD-10-CM | POA: Diagnosis not present

## 2015-08-10 NOTE — Patient Instructions (Signed)
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 These are the medicines you can take for this: Tylenol for baseline pain relief (1-2 extra strength tabs 3x/day) Aleve 2 tabs twice a day with food OR ibuprofen 600mg  three times a day as needed for pain and inflammation. Percocet you were prescribed if very severe (don't take tylenol with this). Consider a muscle relaxant. Stay as active as possible. Do home exercises every day - pick 3-5 of ones on the handout to do and follow the directions. Physical therapy has been shown to be helpful as well - call us if you want to do this. If not improving, will consider further imaging (MRI). Follow up with me in 5-6 weeks for reevaluation.

## 2015-08-15 DIAGNOSIS — M549 Dorsalgia, unspecified: Secondary | ICD-10-CM | POA: Insufficient documentation

## 2015-08-15 NOTE — Assessment & Plan Note (Signed)
independently reviewed radiographs - has fairly significant thoracic degenerative disc disease.  Pain consistent with latissimus dorsi strain and thoracic spine arthritis.  Shown home exercises and stretches to do daily.  Tylenol, nsaids with percocet as needed.  He will call us if he wants to do physical therapy.  F/u in 5-6 weeks for reevaluation.

## 2015-08-15 NOTE — Progress Notes (Signed)
PCP: Tammi Sou, MD  Subjective:   HPI: Patient is a 65 y.o. male here for back pain.  Patient reports he was picking up an entertainment center back in October 2016 which he believes started pain in right side of mid back.  However, he also had a respiratory illness with a lot of coughing around this time. Pain level 4/10, up to 10/10 and sharp. Comes and goes but difficulty getting comfortable. Worse with deep breath, trunk rotation. Tried percocet, trigger point injections. Had radiographs of ribs and a renal CT without obvious cause for pain in this area. Worse getting up from lying down as well. No radiation of pain. No numbness or tingling. No bowel/bladder dysfunction.  Past Medical History  Diagnosis Date  . Diverticulitis   . Family history of colon cancer in mother   . Kidney stones     bilat nonobstrucing renal calculi 2016, + left benign renal cysts  . Acute prostatitis 01/2015  . History of adenomatous polyp of colon 12/24/10  . Melanoma ([place])     R posterolateral neck and R nasal ala  . Hearing impairment     hearing aids  . Tobacco dependence     CT chest for lung ca screning 04/2015 showed benign findings; 1 yr repeat recommended  . COPD (chronic obstructive pulmonary disease) ([place]) 05/07/15    Long time smoker + COPD changes noted on lung ca screening CT  . CAP (community acquired pneumonia) 06/26/2015  . Chronic renal insufficiency, stage II (mild)     stage II/II: CrCl 60s.    Current Outpatient Prescriptions on File Prior to Visit  Medication Sig Dispense Refill  . albuterol (PROAIR HFA) 108 (90 BASE) MCG/ACT inhaler Inhale 2 puffs into the lungs every 6 (six) hours as needed for wheezing or shortness of breath. 1 Inhaler 1  . oxyCODONE-acetaminophen (PERCOCET/ROXICET) 5-325 MG tablet Take 1-2 tablets by mouth every 6 (six) hours as needed for severe pain. 60 tablet 0  . predniSONE (DELTASONE) 20 MG tablet 2 tabs po qd x 5d, then 1 tab po qd x 5d, then  1/2 tab po qd x 6d 18 tablet 0   No current facility-administered medications on file prior to visit.    Past Surgical History  Procedure Laterality Date  . Cholecystectomy  1987  . Knee surgery Left 2015    cartilage repair  . Knee surgery Right 1997    "          "  . Neck surgery  1989  . Thumb surgery Bilateral 2005    and 2006  . Inguinal hernia repair  1984  . Colonoscopy  12/24/10; 2014    Multiple TCSs: pt on 5 yr recall, next due 2019.    Allergies  Allergen Reactions  . Codeine Hives  . Valium [Diazepam] Anaphylaxis    Social History   Social History  . Marital Status: Married    Spouse Name: N/A  . Number of Children: N/A  . Years of Education: N/A   Occupational History  . Not on file.   Social History Main Topics  . Smoking status: Current Every Day Smoker -- 0.25 packs/day for 35 years    Types: Cigarettes  . Smokeless tobacco: Never Used  . Alcohol Use: No  . Drug Use: No  . Sexual Activity: Not on file   Other Topics Concern  . Not on file   Social History Narrative   Married, 3 daughters.   Educ: HS +  2 yr technical school.   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   Relocated to [facility] 2016.   Tob: 50 pack-yr hx; cutting back as of 01/2015.   No alc or drugs.    Family History  Problem Relation Age of Onset  . Colon cancer Mother   . Lung cancer Father   . Brain cancer Brother     BP 137/101 mmHg  Pulse 81  Ht 6\' 1"  (1.854 m)  Wt 260 lb (117.935 kg)  BMI 34.31 kg/m2  Review of Systems: See HPI above.    Objective:  Physical Exam:  Gen: NAD  Back: No gross deformity, swelling, bruising. TTP right thoracic paraspinal region just inferior and medial to scapula.  No midline tenderness. FROM with mild pain on bilateral trunk rotation only.  No pain flexion/extension. NVI distally.    Assessment & Plan:  1. Mid back pain - independently reviewed radiographs - has fairly significant thoracic degenerative disc  disease.  Pain consistent with latissimus dorsi strain and thoracic spine arthritis.  Shown home exercises and stretches to do daily.  Tylenol, nsaids with percocet as needed.  He will call us if he wants to do physical therapy.  F/u in 5-6 weeks for reevaluation.

## 2015-09-10 ENCOUNTER — Encounter: Payer: Self-pay | Admitting: Family Medicine

## 2015-09-10 ENCOUNTER — Ambulatory Visit (INDEPENDENT_AMBULATORY_CARE_PROVIDER_SITE_OTHER): Payer: Medicare Other | Admitting: Family Medicine

## 2015-09-10 VITALS — BP 137/87 | HR 89 | Temp 97.9°F | Resp 16 | Ht 73.0 in | Wt 266.5 lb

## 2015-09-10 DIAGNOSIS — J438 Other emphysema: Secondary | ICD-10-CM | POA: Diagnosis not present

## 2015-09-10 DIAGNOSIS — J069 Acute upper respiratory infection, unspecified: Secondary | ICD-10-CM

## 2015-09-10 MED ORDER — TIOTROPIUM BROMIDE MONOHYDRATE 18 MCG IN CAPS: 18.0000 ug | ORAL_CAPSULE | Freq: Every day | RESPIRATORY_TRACT | Status: DC

## 2015-09-10 MED ORDER — PREDNISONE 20 MG PO TABS: ORAL_TABLET | ORAL | Status: DC

## 2015-09-10 NOTE — Progress Notes (Signed)
OFFICE VISIT  09/10/2015   CC:  Chief Complaint  Patient presents with  . Follow-up    COPD. Pt is fasting.    HPI:    Patient is a 65 y.o. Caucasian male who presents for 33mo f/u COPD--to discuss chronic management. Has cut down to 1 pack cigs per week. Breathing feels good.  Uses rescue albut avg of 2-3 times per week and it helps well.  He has mild chest tightness/SOB/wheeze when doing anything strenuous but doesn't always reach for his albuterol.  However, has nasal/sinus congestion/pressure and sneezing, +sinus HAs---going on a week now and not improving.  Taking zyrtec, mucinex, flonase, and a home remedy mixture he drinks.  Says back feels fine now!  Eval by specialist did show thoracic spine DJD.  Past Medical History  Diagnosis Date  . Diverticulitis   . Family history of colon cancer in mother   . Kidney stones     bilat nonobstrucing renal calculi 2016, + left benign renal cysts  . Acute prostatitis 01/2015  . History of adenomatous polyp of colon 12/24/10  . Melanoma ([place])     R posterolateral neck and R nasal ala  . Hearing impairment     hearing aids  . Tobacco dependence     CT chest for lung ca screning 04/2015 showed benign findings; 1 yr repeat recommended  . COPD (chronic obstructive pulmonary disease) ([place]) 05/07/15    Long time smoker + COPD changes noted on lung ca screening CT  . CAP (community acquired pneumonia) 06/26/2015  . Chronic renal insufficiency, stage II (mild)     stage II/II: CrCl 60s.    Past Surgical History  Procedure Laterality Date  . Cholecystectomy  1987  . Knee surgery Left 2015    cartilage repair  . Knee surgery Right 1997    "          "  . Neck surgery  1989  . Thumb surgery Bilateral 2005    and 2006  . Inguinal hernia repair  1984  . Colonoscopy  12/24/10; 2014    Multiple TCSs: pt on 5 yr recall, next due 2019.    Outpatient Prescriptions Prior to Visit  Medication Sig Dispense Refill  . albuterol (PROAIR HFA) 108  (90 BASE) MCG/ACT inhaler Inhale 2 puffs into the lungs every 6 (six) hours as needed for wheezing or shortness of breath. 1 Inhaler 1  . oxyCODONE-acetaminophen (PERCOCET/ROXICET) 5-325 MG tablet Take 1-2 tablets by mouth every 6 (six) hours as needed for severe pain. (Patient not taking: Reported on 09/10/2015) 60 tablet 0  . predniSONE (DELTASONE) 20 MG tablet 2 tabs po qd x 5d, then 1 tab po qd x 5d, then 1/2 tab po qd x 6d (Patient not taking: Reported on 09/10/2015) 18 tablet 0   No facility-administered medications prior to visit.    Allergies  Allergen Reactions  . Codeine Hives  . Valium [Diazepam] Anaphylaxis    ROS As per HPI  PE: Blood pressure 137/87, pulse 89, temperature 97.9 F (36.6 C), temperature source Oral, resp. rate 16, height 6\' 1"  (1.854 m), weight 266 lb 8 oz (120.884 kg), SpO2 95 %. VS: noted--normal. Gen: alert, NAD, NONTOXIC APPEARING. HEENT: eyes without injection, drainage, or swelling.  Ears: EACs clear, TMs with normal light reflex and landmarks.  Nose: Clear rhinorrhea, with some dried, crusty exudate adherent to mildly injected mucosa.  No purulent d/c.  No paranasal sinus TTP.  No facial swelling.  Throat and mouth  without focal lesion.  No pharyngial swelling, erythema, or exudate.   Neck: supple, no LAD.   LUNGS: CTA bilat, nonlabored resps.   CV: RRR, no m/r/g.   LABS:  None today    Chemistry      Component Value Date/Time   NA 142 07/18/2015 1138   K 4.6 07/18/2015 1138   CL 105 07/18/2015 1138   CO2 30 07/18/2015 1138   BUN 16 07/18/2015 1138   CREATININE 1.27 07/18/2015 1138      Component Value Date/Time   CALCIUM 9.6 07/18/2015 1138   ALKPHOS 56 01/31/2015 1401   AST 11 01/31/2015 1401   ALT 11 01/31/2015 1401   BILITOT 0.4 01/31/2015 1401      IMPRESSION AND PLAN:  1) Viral URI vs allergic rhino-sinusitis: hold off on antibiotic at this time but we'll see if prednisone 40mg  qd x 5d helps.  Continue flonase + saline nasal  spray.  2) COPD: start spiriva qd.  Continue albuterol HFA 1-2 p q4h prn. Encouraged complete smoking cessation.  An After Visit Summary was printed and given to the patient.  FOLLOW UP: Return in about 2 months (around 11/08/2015) for f/u COPD.

## 2015-09-10 NOTE — Progress Notes (Signed)
Pre visit review using our clinic review tool, if applicable. No additional management support is needed unless otherwise documented below in the visit note. 

## 2015-11-08 ENCOUNTER — Encounter: Payer: Self-pay | Admitting: Family Medicine

## 2015-11-08 ENCOUNTER — Ambulatory Visit (INDEPENDENT_AMBULATORY_CARE_PROVIDER_SITE_OTHER): Payer: Medicare Other | Admitting: Family Medicine

## 2015-11-08 VITALS — BP 149/95 | HR 78 | Temp 98.0°F | Resp 16 | Ht 73.0 in | Wt 267.8 lb

## 2015-11-08 DIAGNOSIS — J449 Chronic obstructive pulmonary disease, unspecified: Secondary | ICD-10-CM | POA: Insufficient documentation

## 2015-11-08 DIAGNOSIS — J438 Other emphysema: Secondary | ICD-10-CM | POA: Diagnosis not present

## 2015-11-08 DIAGNOSIS — L989 Disorder of the skin and subcutaneous tissue, unspecified: Secondary | ICD-10-CM | POA: Diagnosis not present

## 2015-11-08 NOTE — Progress Notes (Signed)
Pre visit review using our clinic review tool, if applicable. No additional management support is needed unless otherwise documented below in the visit note. 

## 2015-11-08 NOTE — Progress Notes (Signed)
OFFICE VISIT  11/08/2015   CC:  Chief Complaint  Patient presents with  . Follow-up    COPD   HPI:    Patient is a 65 y.o. Caucasian male who presents for 2 mo f/u COPD. Last visit I gave a steroid burst for mild exacerbation of COPD he was having with a URI, plus I started him on spiriva handihaler.  He is cutting back on cigs still: down to 2-3 cigs per week now. Taking spiriva daily and finds his breathing is better and coughing is a lot less.  No need since last visit for any albuterol.    Noted spot on R side of scalp about 4-5 mo ago.  No pain unless he pokes at it or hits it brushing hair.  Nothing has ever oozed out of it that he knows of.   Past Medical History  Diagnosis Date  . Diverticulitis   . Family history of colon cancer in mother   . Kidney stones     bilat nonobstrucing renal calculi 2016, + left benign renal cysts  . Acute prostatitis 01/2015  . History of adenomatous polyp of colon 12/24/10  . Melanoma ([place])     R posterolateral neck and R nasal ala  . Hearing impairment     hearing aids  . Tobacco dependence     CT chest for lung ca screning 04/2015 showed benign findings; 1 yr repeat recommended  . COPD (chronic obstructive pulmonary disease) ([place]) 05/07/15    Long time smoker + COPD changes noted on lung ca screening CT  . CAP (community acquired pneumonia) 06/26/2015  . Chronic renal insufficiency, stage II (mild)     stage II/II: CrCl 60s.    Past Surgical History  Procedure Laterality Date  . Cholecystectomy  1987  . Knee surgery Left 2015    cartilage repair  . Knee surgery Right 1997    "          "  . Neck surgery  1989  . Thumb surgery Bilateral 2005    and 2006  . Inguinal hernia repair  1984  . Colonoscopy  12/24/10; 2014    Multiple TCSs: pt on 5 yr recall, next due 2019.    Outpatient Prescriptions Prior to Visit  Medication Sig Dispense Refill  . albuterol (PROAIR HFA) 108 (90 BASE) MCG/ACT inhaler Inhale 2 puffs into the lungs  every 6 (six) hours as needed for wheezing or shortness of breath. 1 Inhaler 1  . tiotropium (SPIRIVA HANDIHALER) 18 MCG inhalation capsule Place 1 capsule (18 mcg total) into inhaler and inhale daily. 30 capsule 12  . predniSONE (DELTASONE) 20 MG tablet 2 tabs po qd x 5d (Patient not taking: Reported on 11/08/2015) 10 tablet 0   No facility-administered medications prior to visit.    Allergies  Allergen Reactions  . Codeine Hives  . Valium [Diazepam] Anaphylaxis    ROS As per HPI  PE: Blood pressure 149/95, pulse 78, temperature 98 F (36.7 C), temperature source Oral, resp. rate 16, height 6\' 1"  (1.854 m), weight 267 lb 12 oz (121.451 kg), SpO2 96 %. Gen: Alert, well appearing.  Patient is oriented to person, place, time, and situation. [id number]: no injection, icteris, swelling, or exudate.  EOMI, PERRLA. Mouth: lips without lesion/swelling.  Oral mucosa pink and moist. Oropharynx without erythema, exudate, or swelling.  CV: RRR, no m/r/g.   LUNGS: CTA bilat, nonlabored resps, good aeration in all lung fields. Scalp: R vertex region with about 1-2  cm slightly raised plaque with a crusty texture, essentially flesh colored.   No other similar lesions on scalp.  LABS:  None today  IMPRESSION AND PLAN:  1) COPD: MUCH improved since getting on spiriva handihaler.  Needs to completely quit smoking--he is close.   The current medical regimen is effective;  continue present plan and medications.  2) Scalp lesion: I'm not sure what this is.  Refer to dermatology.  An After Visit Summary was printed and given to the patient.  FOLLOW UP: Return in about 6 months (around 05/09/2016) for routine chronic illness f/u.  Signed:  Crissie Sickles, MD           11/08/2015

## 2016-01-02 DIAGNOSIS — D485 Neoplasm of uncertain behavior of skin: Secondary | ICD-10-CM | POA: Diagnosis not present

## 2016-01-02 DIAGNOSIS — L570 Actinic keratosis: Secondary | ICD-10-CM | POA: Diagnosis not present

## 2016-01-02 DIAGNOSIS — Z85828 Personal history of other malignant neoplasm of skin: Secondary | ICD-10-CM | POA: Diagnosis not present

## 2016-01-02 DIAGNOSIS — D0359 Melanoma in situ of other part of trunk: Secondary | ICD-10-CM | POA: Diagnosis not present

## 2016-01-02 DIAGNOSIS — C44519 Basal cell carcinoma of skin of other part of trunk: Secondary | ICD-10-CM | POA: Diagnosis not present

## 2016-01-02 DIAGNOSIS — D225 Melanocytic nevi of trunk: Secondary | ICD-10-CM | POA: Diagnosis not present

## 2016-01-07 DIAGNOSIS — D485 Neoplasm of uncertain behavior of skin: Secondary | ICD-10-CM | POA: Diagnosis not present

## 2016-01-07 DIAGNOSIS — C44319 Basal cell carcinoma of skin of other parts of face: Secondary | ICD-10-CM | POA: Diagnosis not present

## 2016-01-07 DIAGNOSIS — C44719 Basal cell carcinoma of skin of left lower limb, including hip: Secondary | ICD-10-CM | POA: Diagnosis not present

## 2016-01-07 DIAGNOSIS — D225 Melanocytic nevi of trunk: Secondary | ICD-10-CM | POA: Diagnosis not present

## 2016-01-07 DIAGNOSIS — L570 Actinic keratosis: Secondary | ICD-10-CM | POA: Diagnosis not present

## 2016-01-19 ENCOUNTER — Encounter: Payer: Self-pay | Admitting: Family Medicine

## 2016-01-24 DIAGNOSIS — L905 Scar conditions and fibrosis of skin: Secondary | ICD-10-CM | POA: Diagnosis not present

## 2016-01-24 DIAGNOSIS — C4359 Malignant melanoma of other part of trunk: Secondary | ICD-10-CM | POA: Diagnosis not present

## 2016-02-07 DIAGNOSIS — L988 Other specified disorders of the skin and subcutaneous tissue: Secondary | ICD-10-CM | POA: Diagnosis not present

## 2016-02-07 DIAGNOSIS — C44519 Basal cell carcinoma of skin of other part of trunk: Secondary | ICD-10-CM | POA: Diagnosis not present

## 2016-02-21 DIAGNOSIS — C44619 Basal cell carcinoma of skin of left upper limb, including shoulder: Secondary | ICD-10-CM | POA: Diagnosis not present

## 2016-02-21 DIAGNOSIS — C44719 Basal cell carcinoma of skin of left lower limb, including hip: Secondary | ICD-10-CM | POA: Diagnosis not present

## 2016-02-28 DIAGNOSIS — C44319 Basal cell carcinoma of skin of other parts of face: Secondary | ICD-10-CM | POA: Diagnosis not present

## 2016-03-11 DIAGNOSIS — M25562 Pain in left knee: Secondary | ICD-10-CM | POA: Diagnosis not present

## 2016-03-13 DIAGNOSIS — Z4802 Encounter for removal of sutures: Secondary | ICD-10-CM | POA: Diagnosis not present

## 2016-04-01 DIAGNOSIS — M25562 Pain in left knee: Secondary | ICD-10-CM | POA: Diagnosis not present

## 2016-04-02 ENCOUNTER — Encounter: Payer: Self-pay | Admitting: Family Medicine

## 2016-04-02 ENCOUNTER — Ambulatory Visit (INDEPENDENT_AMBULATORY_CARE_PROVIDER_SITE_OTHER): Payer: Medicare Other | Admitting: Family Medicine

## 2016-04-02 VITALS — BP 120/86 | HR 93 | Temp 97.9°F | Resp 16 | Ht 73.5 in | Wt 267.0 lb

## 2016-04-02 DIAGNOSIS — Z01818 Encounter for other preprocedural examination: Secondary | ICD-10-CM | POA: Diagnosis not present

## 2016-04-02 DIAGNOSIS — N182 Chronic kidney disease, stage 2 (mild): Secondary | ICD-10-CM

## 2016-04-02 NOTE — Progress Notes (Signed)
Office Note 04/02/2016  CC:  Chief Complaint  Patient presents with  . Follow-up    clearance left knee   HPI:  Craig Mccann is a 65 y.o. White male who is here for medical clearance for upcoming left TKA with Dr. Percell Miller on 04/23/16. He states he is not taking his spiriva or albuterol because he feels like he doesn't need them.   He has cut back to less than 1 cig per day average! He can walk up an incline in hot weather and feels no SOB or CP.  Runs after grandchildren w/out problem. He push mows his yard and trims bushes and digs and feels no SOB or CP. He has no PMH of CAD, valvular dz, dysrhythmia, or CHF.  He has mild COPD which he is not limited by at the time being--on NO inhalers.    Past Medical History:  Diagnosis Date  . Acute prostatitis 01/2015  . Basal cell carcinoma 2017   Multiple: back, left shin, left arm  . CAP (community acquired pneumonia) 06/26/2015  . Chronic renal insufficiency, stage II (mild)    stage II/II: CrCl 60s.  [name] COPD (chronic obstructive pulmonary disease) ([place]) 05/07/15   Long time smoker + COPD changes noted on lung ca screening CT  . Diverticulitis   . Family history of colon cancer in mother   . Hearing impairment    hearing aids  . History of adenomatous polyp of colon 12/24/10  . Kidney stones    bilat nonobstrucing renal calculi 2016, + left benign renal cysts  . Melanoma ([place])    R posterolateral neck and R nasal ala.  Most recent was left mid back 01/2016 (Dr. Tarri Glenn).  . Tobacco dependence    CT chest for lung ca screning 04/2015 showed benign findings; 1 yr repeat recommended    Past Surgical History:  Procedure Laterality Date  . CHOLECYSTECTOMY  1987  . COLONOSCOPY  12/24/10; 2014   Multiple TCSs: pt on 5 yr recall, next due 2019.  . INGUINAL HERNIA REPAIR  1984  . KNEE SURGERY Left 2015   cartilage repair  . KNEE SURGERY Right 1997   "          "  . [place]  . Thumb surgery Bilateral 2005   and 2006     Family History  Problem Relation Age of Onset  . Colon cancer Mother   . Lung cancer Father   . Brain cancer Brother     Social History   Social History  . Marital status: Married    Spouse name: N/A  . Number of children: N/A  . Years of education: N/A   Occupational History  . Not on file.   Social History Main Topics  . Smoking status: Current Every Day Smoker    Packs/day: 0.25    Years: 35.00    Types: Cigarettes  . Smokeless tobacco: Never Used  . Alcohol use No  . Drug use: No  . Sexual activity: Not on file   Other Topics Concern  . Not on file   Social History Narrative   Married, 3 daughters.   Educ: HS + 2 yr technical school.   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   Relocated to [facility] 2016.   Tob: 50 pack-yr hx; cutting back as of 01/2015.   No alc or drugs.    MEDS: none currently  Allergies  Allergen Reactions  . Codeine Hives  . Valium [Diazepam]  Anaphylaxis    ROS Review of Systems  Constitutional: Negative for appetite change, chills, fatigue and fever.  HENT: Negative for congestion, dental problem, ear pain and sore throat.   Eyes: Negative for discharge, redness and visual disturbance.  Respiratory: Negative for cough, chest tightness, shortness of breath and wheezing.   Cardiovascular: Negative for chest pain, palpitations and leg swelling.  Gastrointestinal: Negative for abdominal pain, blood in stool, diarrhea, nausea and vomiting.  Genitourinary: Negative for difficulty urinating, dysuria, flank pain, frequency, hematuria and urgency.  Musculoskeletal: Positive for arthralgias (left knee). Negative for back pain, joint swelling, myalgias and neck stiffness.  Skin: Negative for pallor and rash.  Neurological: Negative for dizziness, speech difficulty, weakness and headaches.  Hematological: Negative for adenopathy. Does not bruise/bleed easily.  Psychiatric/Behavioral: Negative for confusion and sleep disturbance. The  patient is not nervous/anxious.     PE; Blood pressure 120/86, pulse 93, temperature 97.9 F (36.6 C), temperature source Oral, resp. rate 16, height 6' 1.5" (1.867 m), weight 267 lb (121.1 kg), SpO2 95 %. Gen: Alert, well appearing.  Patient is oriented to person, place, time, and situation. AFFECT: pleasant, lucid thought and speech. ENT: Ears: EACs clear, normal epithelium.  TMs with good light reflex and landmarks bilaterally.  Eyes: no injection, icteris, swelling, or exudate.  EOMI, PERRLA. Nose: no drainage or turbinate edema/swelling.  No injection or focal lesion.  Mouth: lips without lesion/swelling.  Oral mucosa pink and moist.  Dentition intact and without obvious caries or gingival swelling.  Oropharynx without erythema, exudate, or swelling.  Neck: supple/nontender.  No LAD, mass, or TM.  Carotid pulses 2+ bilaterally, without bruits. CV: RRR, no m/r/g.   LUNGS: CTA bilat, nonlabored resps, good aeration in all lung fields. ABD: soft, NT, ND, BS normal.  No hepatospenomegaly or mass.  No bruits. EXT: no clubbing, cyanosis, or edema.  Musculoskeletal: no joint swelling, erythema, warmth, or tenderness.  ROM of all joints intact. Skin - no sores or suspicious lesions or rashes or color changes   Pertinent labs:   Lab Results  Component Value Date   WBC 8.2 07/18/2015   HGB 15.4 07/18/2015   HCT 47.0 07/18/2015   MCV 90.2 07/18/2015   PLT 273.0 07/18/2015   Lab Results  Component Value Date   CREATININE 1.27 07/18/2015   BUN 16 07/18/2015   NA 142 07/18/2015   K 4.6 07/18/2015   CL 105 07/18/2015   CO2 30 07/18/2015   Lab Results  Component Value Date   ALT 11 01/31/2015   AST 11 01/31/2015   ALKPHOS 56 01/31/2015   BILITOT 0.4 01/31/2015   12 LEAD EKG today: NSR, rate 83, no signs of ischemia or hypertrophy.  No ectopy. No prior EKG available for comparison.  ASSESSMENT AND PLAN:   Preoperative clearance for upcoming left knee replacement. He is doing  great, no problems that require any cardiovascular or pulmonary testing or referral. He was encouraged to COMPLETELY quit smoking--he is nearly at this point now. Will check CBC and CMET today. EKG today: reassuring. Will send pre-op clearance form to his orthopedist.  An After Visit Summary was printed and given to the patient.  FOLLOW UP:  Return for follow up as needed.  Signed:  Crissie Sickles, MD           04/02/2016

## 2016-04-03 LAB — CBC WITH DIFFERENTIAL/PLATELET
BASOS ABS: 0 10*3/uL (ref 0.0–0.1)
Basophils Relative: 0.3 % (ref 0.0–3.0)
EOS PCT: 1.3 % (ref 0.0–5.0)
Eosinophils Absolute: 0.1 10*3/uL (ref 0.0–0.7)
HCT: 43.7 % (ref 39.0–52.0)
Hemoglobin: 14.8 g/dL (ref 13.0–17.0)
LYMPHS ABS: 1.7 10*3/uL (ref 0.7–4.0)
Lymphocytes Relative: 18.2 % (ref 12.0–46.0)
MCHC: 33.7 g/dL (ref 30.0–36.0)
MCV: 89.5 fl (ref 78.0–100.0)
MONO ABS: 0.4 10*3/uL (ref 0.1–1.0)
MONOS PCT: 4.8 % (ref 3.0–12.0)
NEUTROS ABS: 7 10*3/uL (ref 1.4–7.7)
NEUTROS PCT: 75.4 % (ref 43.0–77.0)
PLATELETS: 238 10*3/uL (ref 150.0–400.0)
RBC: 4.89 Mil/uL (ref 4.22–5.81)
RDW: 13.8 % (ref 11.5–15.5)
WBC: 9.3 10*3/uL (ref 4.0–10.5)

## 2016-04-03 LAB — COMPREHENSIVE METABOLIC PANEL
ALT: 11 U/L (ref 0–53)
AST: 10 U/L (ref 0–37)
Albumin: 4.2 g/dL (ref 3.5–5.2)
Alkaline Phosphatase: 58 U/L (ref 39–117)
BUN: 14 mg/dL (ref 6–23)
CO2: 32 meq/L (ref 19–32)
Calcium: 9.2 mg/dL (ref 8.4–10.5)
Chloride: 104 mEq/L (ref 96–112)
Creatinine, Ser: 1.3 mg/dL (ref 0.40–1.50)
GFR: 58.6 mL/min — AB (ref >60.00)
GLUCOSE: 97 mg/dL (ref 70–99)
POTASSIUM: 4.3 meq/L (ref 3.5–5.1)
SODIUM: 141 meq/L (ref 135–145)
TOTAL PROTEIN: 6.6 g/dL (ref 6.0–8.3)
Total Bilirubin: 0.6 mg/dL (ref 0.2–1.2)

## 2016-04-04 ENCOUNTER — Other Ambulatory Visit: Payer: Self-pay | Admitting: Physician Assistant

## 2016-04-04 DIAGNOSIS — M25562 Pain in left knee: Secondary | ICD-10-CM | POA: Diagnosis not present

## 2016-04-04 NOTE — H&P (Signed)
TOTAL KNEE ADMISSION H&P  Patient is being admitted for left total knee arthroplasty.  Subjective:  Chief Complaint:left knee pain.  HPI: Craig Mccann, 65 y.o. male, has a history of pain and functional disability in the left knee due to arthritis and has failed non-surgical conservative treatments for greater than 12 weeks to includeNSAID's and/or analgesics, corticosteriod injections and activity modification.  Onset of symptoms was abrupt, starting 6 years ago with rapidlly worsening course since that time. The patient noted prior procedures on the knee to include  arthroscopy and menisectomy on the left knee(s).  Patient currently rates pain in the left knee(s) at 10 out of 10 with activity. Patient has night pain, worsening of pain with activity and weight bearing, pain that interferes with activities of daily living, pain with passive range of motion, crepitus and joint swelling.  Patient has evidence of subchondral sclerosis and joint space narrowing by imaging studies. There is no active infection.  Patient Active Problem List   Diagnosis Date Noted  . COPD (chronic obstructive pulmonary disease) ([place]) 11/08/2015  . Mid back pain 08/15/2015   Past Medical History:  Diagnosis Date  . Acute prostatitis 01/2015  . Basal cell carcinoma 2017   Multiple: back, left shin, left arm  . CAP (community acquired pneumonia) 06/26/2015  . Chronic renal insufficiency, stage II (mild)    stage II/II: CrCl 60s.  [name] COPD (chronic obstructive pulmonary disease) ([place]) 05/07/15   Long time smoker + COPD changes noted on lung ca screening CT  . Diverticulitis   . Family history of colon cancer in mother   . Hearing impairment    hearing aids  . History of adenomatous polyp of colon 12/24/10  . Kidney stones    bilat nonobstrucing renal calculi 2016, + left benign renal cysts  . Melanoma ([place])    R posterolateral neck and R nasal ala.  Most recent was left mid back 01/2016 (Dr. Tarri Glenn).  .  Tobacco dependence    CT chest for lung ca screning 04/2015 showed benign findings; 1 yr repeat recommended    Past Surgical History:  Procedure Laterality Date  . CHOLECYSTECTOMY  1987  . COLONOSCOPY  12/24/10; 2014   Multiple TCSs: pt on 5 yr recall, next due 2019.  . INGUINAL HERNIA REPAIR  1984  . KNEE SURGERY Left 2015   cartilage repair  . KNEE SURGERY Right 1997   "          "  . [place]  . Thumb surgery Bilateral 2005   and 2006     (Not in a hospital admission) Allergies  Allergen Reactions  . Codeine Hives  . Valium [Diazepam] Anaphylaxis    Social History  Substance Use Topics  . Smoking status: Current Every Day Smoker    Packs/day: 0.25    Years: 35.00    Types: Cigarettes  . Smokeless tobacco: Never Used  . Alcohol use No    Family History  Problem Relation Age of Onset  . Colon cancer Mother   . Lung cancer Father   . Brain cancer Brother      Review of Systems  Constitutional: Negative.   HENT: Negative.   Eyes: Negative.   Respiratory: Positive for cough and sputum production. Negative for hemoptysis, shortness of breath and wheezing.   Cardiovascular: Negative.   Gastrointestinal: Negative.   Genitourinary: Negative.   Musculoskeletal: Positive for joint pain.  Skin: Negative.   Neurological: Negative.   Endo/Heme/Allergies: Negative.  Psychiatric/Behavioral: Negative.     Objective:  Physical Exam  Constitutional: He is oriented to person, place, and time. He appears well-developed and well-nourished.  HENT:  Head: Normocephalic and atraumatic.  Eyes: EOM are normal. Pupils are equal, round, and reactive to light.  Neck: Normal range of motion. Neck supple.  Cardiovascular: Normal rate and regular rhythm.   Respiratory: Effort normal and breath sounds normal.  GI: Soft. Bowel sounds are normal.  Musculoskeletal:  Examination of his left knee reveals range of motion from 0-120 degrees.  Medial joint line tenderness.   Positive anterior drawer.  Stable to valgus and varus stress.  He is neurovascularly intact distally.    Neurological: He is alert and oriented to person, place, and time.  Skin: Skin is warm and dry.  Psychiatric: He has a normal mood and affect. His behavior is normal. Judgment and thought content normal.    Vital signs in last 24 hours: @VSRANGES @  Labs:   Estimated body mass index is 34.75 kg/m as calculated from the following:   Height as of 04/02/16: 6' 1.5" (1.867 m).   Weight as of 04/02/16: 121.1 kg (267 lb).   Imaging Review Plain radiographs demonstrate severe degenerative joint disease of the left knee(s). The overall alignment ismild varus. The bone quality appears to be fair for age and reported activity level.  Assessment/Plan:  End stage arthritis, left knee   The patient history, physical examination, clinical judgment of the provider and imaging studies are consistent with end stage degenerative joint disease of the left knee(s) and total knee arthroplasty is deemed medically necessary. The treatment options including medical management, injection therapy arthroscopy and arthroplasty were discussed at length. The risks and benefits of total knee arthroplasty were presented and reviewed. The risks due to aseptic loosening, infection, stiffness, patella tracking problems, thromboembolic complications and other imponderables were discussed. The patient acknowledged the explanation, agreed to proceed with the plan and consent was signed. Patient is being admitted for inpatient treatment for surgery, pain control, PT, OT, prophylactic antibiotics, VTE prophylaxis, progressive ambulation and ADL's and discharge planning. The patient is planning to be discharged home with home health services

## 2016-04-10 NOTE — Pre-Procedure Instructions (Signed)
Craig Mccann  04/10/2016     Your procedure is scheduled on : Wednesday April 23, 2016 at 11:45 AM.  Report to [facility] Admitting at 9:45 AM.  Call this number if you have problems the morning of surgery: [phone]     Remember:  Do not eat food or drink liquids after midnight.  Take these medicines the morning of surgery with A SIP OF WATER : NONE   Stop taking any vitamins, herbal medications/supplements, NSAIDs, Ibuprofen, Advil, Motrin, Aleve, etc on Wednesday September 13th   Do not wear jewelry.  Do not wear lotions, powders, or cologne, or deoderant.  Men may shave face and neck.  Do not bring valuables to the hospital.  [facility] is not responsible for any belongings or valuables.  Contacts, dentures or bridgework may not be worn into surgery.  Leave your suitcase in the car.  After surgery it may be brought to your room.  For patients admitted to the hospital, discharge time will be determined by your treatment team.  Patients discharged the day of surgery will not be allowed to drive home.   Name and phone number of your driver:     Special instructions:  Shower using CHG soap the night before and the morning of your surgery  Please read over the following fact sheets that you were given. Pain Booklet, Total Joint Packet and MRSA Information

## 2016-04-11 ENCOUNTER — Encounter (HOSPITAL_COMMUNITY)
Admission: RE | Admit: 2016-04-11 | Discharge: 2016-04-11 | Disposition: A | Discharge status: Home / Self Care | Payer: Medicare Other | Source: Ambulatory Visit | Attending: Orthopedic Surgery | Admitting: Orthopedic Surgery

## 2016-04-11 ENCOUNTER — Encounter (HOSPITAL_COMMUNITY): Payer: Self-pay

## 2016-04-11 DIAGNOSIS — M1712 Unilateral primary osteoarthritis, left knee: Secondary | ICD-10-CM | POA: Diagnosis not present

## 2016-04-11 DIAGNOSIS — Z888 Allergy status to other drugs, medicaments and biological substances status: Secondary | ICD-10-CM | POA: Insufficient documentation

## 2016-04-11 DIAGNOSIS — M549 Dorsalgia, unspecified: Secondary | ICD-10-CM | POA: Insufficient documentation

## 2016-04-11 DIAGNOSIS — Z0183 Encounter for blood typing: Secondary | ICD-10-CM | POA: Diagnosis not present

## 2016-04-11 DIAGNOSIS — Z01812 Encounter for preprocedural laboratory examination: Secondary | ICD-10-CM | POA: Diagnosis not present

## 2016-04-11 DIAGNOSIS — Z85828 Personal history of other malignant neoplasm of skin: Secondary | ICD-10-CM | POA: Diagnosis not present

## 2016-04-11 DIAGNOSIS — J449 Chronic obstructive pulmonary disease, unspecified: Secondary | ICD-10-CM | POA: Insufficient documentation

## 2016-04-11 DIAGNOSIS — N182 Chronic kidney disease, stage 2 (mild): Secondary | ICD-10-CM | POA: Diagnosis not present

## 2016-04-11 DIAGNOSIS — Z885 Allergy status to narcotic agent status: Secondary | ICD-10-CM | POA: Insufficient documentation

## 2016-04-11 DIAGNOSIS — F17210 Nicotine dependence, cigarettes, uncomplicated: Secondary | ICD-10-CM | POA: Insufficient documentation

## 2016-04-11 DIAGNOSIS — Z85820 Personal history of malignant melanoma of skin: Secondary | ICD-10-CM | POA: Insufficient documentation

## 2016-04-11 HISTORY — DX: Other complications of anesthesia, initial encounter: T88.59XA

## 2016-04-11 HISTORY — DX: Adverse effect of unspecified anesthetic, initial encounter: T41.45XA

## 2016-04-11 HISTORY — DX: Other reaction to spinal and lumbar puncture: G97.1

## 2016-04-11 LAB — CBC
HCT: 46.1 % (ref 39.0–52.0)
Hemoglobin: 15.2 g/dL (ref 13.0–17.0)
MCH: 30.4 pg (ref 26.0–34.0)
MCHC: 33 g/dL (ref 30.0–36.0)
MCV: 92.2 fL (ref 78.0–100.0)
PLATELETS: 243 10*3/uL (ref 150–400)
RBC: 5 MIL/uL (ref 4.22–5.81)
RDW: 13.6 % (ref 11.5–15.5)
WBC: 8.1 10*3/uL (ref 4.0–10.5)

## 2016-04-11 LAB — SURGICAL PCR SCREEN
MRSA, PCR: NEGATIVE
STAPHYLOCOCCUS AUREUS: POSITIVE — AB

## 2016-04-11 LAB — BASIC METABOLIC PANEL
ANION GAP: 7 (ref 5–15)
BUN: 19 mg/dL (ref 6–20)
CALCIUM: 10.1 mg/dL (ref 8.9–10.3)
CO2: 28 mmol/L (ref 22–32)
Chloride: 106 mmol/L (ref 101–111)
Creatinine, Ser: 1.29 mg/dL — ABNORMAL HIGH (ref 0.61–1.24)
GFR, EST NON AFRICAN AMERICAN: 56 mL/min — AB (ref >60)
Glucose, Bld: 81 mg/dL (ref 65–99)
POTASSIUM: 5.2 mmol/L — AB (ref 3.5–5.1)
SODIUM: 141 mmol/L (ref 135–145)

## 2016-04-11 LAB — TYPE AND SCREEN
ABO/RH(D): A POS
Antibody Screen: NEGATIVE

## 2016-04-11 LAB — ABO/RH: ABO/RH(D): A POS

## 2016-04-11 NOTE — Progress Notes (Signed)
Nurse called in prescription for Mupirocin into [facility] pharmacy, and then called and informed patient of positive PCR results. Patient verbalized understanding.

## 2016-04-11 NOTE — Progress Notes (Signed)
PCP is Shawnie Dapper  Patient denied having any acute cardiac or pulmonary issues, but did inform Nurse that he is getting over a cold that started about three weeks ago. Patient denied having a cough, fever, or any shortness of breath.  Patient denied having any kidney issues, but did inform Nurse that he had some kidney stones which he passed after having lithotripsy.

## 2016-04-22 MED ORDER — TRANEXAMIC ACID 1000 MG/10ML IV SOLN
1000.0000 mg | INTRAVENOUS | Status: AC
  Administered 2016-04-23: 1000 mg via INTRAVENOUS
  Filled 2016-04-22: qty 10

## 2016-04-22 MED ORDER — CEFAZOLIN SODIUM 10 G IJ SOLR
3.0000 g | INTRAMUSCULAR | Status: AC
  Administered 2016-04-23: 3 g via INTRAVENOUS
  Filled 2016-04-22: qty 3000

## 2016-04-22 NOTE — Anesthesia Preprocedure Evaluation (Addendum)
Anesthesia Evaluation  Patient identified by MRN, date of birth, ID band Patient awake    Reviewed: Allergy & Precautions, NPO status , Patient's Chart, lab work & pertinent test results  History of Anesthesia Complications (+) POST - OP SPINAL HEADACHE and history of anesthetic complications  Airway Mallampati: II  TM Distance: <3 FB Neck ROM: Full    Dental  (+) Teeth Intact, Dental Advisory Given   Pulmonary COPD, Current Smoker,    Pulmonary exam normal breath sounds clear to auscultation       Cardiovascular negative cardio ROS Normal cardiovascular exam Rhythm:Regular Rate:Normal     Neuro/Psych  Headaches, negative psych ROS   GI/Hepatic negative GI ROS, Neg liver ROS,   Endo/Other  Obesity   Renal/GU Renal InsufficiencyRenal disease     Musculoskeletal negative musculoskeletal ROS (+)   Abdominal   Peds  Hematology negative hematology ROS (+) Plt 243k   Anesthesia Other Findings Day of surgery medications reviewed with the patient.  Reproductive/Obstetrics                            Anesthesia Physical Anesthesia Plan  ASA: II  Anesthesia Plan: General and Regional   Post-op Pain Management:  Regional for Post-op pain   Induction: Intravenous  Airway Management Planned: Oral ETT  Additional Equipment:   Intra-op Plan:   Post-operative Plan: Extubation in OR  Informed Consent: I have reviewed the patients History and Physical, chart, labs and discussed the procedure including the risks, benefits and alternatives for the proposed anesthesia with the patient or authorized representative who has indicated his/her understanding and acceptance.   Dental advisory given  Plan Discussed with: CRNA  Anesthesia Plan Comments: (Risks/benefits of general anesthesia discussed with patient including risk of damage to teeth, lips, gum, and tongue, nausea/vomiting, allergic  reactions to medications, and the possibility of heart attack, stroke and death.  All patient questions answered.  Patient wishes to proceed.  Discussed risks and benefits of adductor canal block including failure, bleeding, infection, nerve damage, weakness. Discussed that the block may not prevent all of the pain in the knee. Questions answered. Patient consents to block. )        Anesthesia Quick Evaluation

## 2016-04-23 ENCOUNTER — Encounter (HOSPITAL_COMMUNITY)
Admission: RE | Disposition: A | Discharge status: Home Health | Payer: Self-pay | Source: Ambulatory Visit | Attending: Orthopedic Surgery

## 2016-04-23 ENCOUNTER — Inpatient Hospital Stay (HOSPITAL_COMMUNITY)
Admission: RE | Admit: 2016-04-23 | Discharge: 2016-04-24 | DRG: 470 | Disposition: A | Discharge status: Home Health | Payer: Medicare Other | Source: Ambulatory Visit | Attending: Orthopedic Surgery | Admitting: Orthopedic Surgery

## 2016-04-23 ENCOUNTER — Inpatient Hospital Stay (HOSPITAL_COMMUNITY): Payer: Medicare Other | Admitting: Anesthesiology

## 2016-04-23 ENCOUNTER — Inpatient Hospital Stay (HOSPITAL_COMMUNITY): Payer: Medicare Other

## 2016-04-23 ENCOUNTER — Inpatient Hospital Stay (HOSPITAL_COMMUNITY): Payer: Medicare Other | Admitting: Emergency Medicine

## 2016-04-23 ENCOUNTER — Encounter (HOSPITAL_COMMUNITY): Payer: Self-pay | Admitting: Urology

## 2016-04-23 DIAGNOSIS — H9190 Unspecified hearing loss, unspecified ear: Secondary | ICD-10-CM | POA: Diagnosis present

## 2016-04-23 DIAGNOSIS — J449 Chronic obstructive pulmonary disease, unspecified: Secondary | ICD-10-CM | POA: Diagnosis present

## 2016-04-23 DIAGNOSIS — Z85820 Personal history of malignant melanoma of skin: Secondary | ICD-10-CM | POA: Diagnosis not present

## 2016-04-23 DIAGNOSIS — G8918 Other acute postprocedural pain: Secondary | ICD-10-CM | POA: Diagnosis not present

## 2016-04-23 DIAGNOSIS — M1712 Unilateral primary osteoarthritis, left knee: Principal | ICD-10-CM | POA: Diagnosis present

## 2016-04-23 DIAGNOSIS — F17210 Nicotine dependence, cigarettes, uncomplicated: Secondary | ICD-10-CM | POA: Diagnosis present

## 2016-04-23 DIAGNOSIS — M25562 Pain in left knee: Secondary | ICD-10-CM | POA: Diagnosis present

## 2016-04-23 DIAGNOSIS — N182 Chronic kidney disease, stage 2 (mild): Secondary | ICD-10-CM | POA: Diagnosis present

## 2016-04-23 DIAGNOSIS — R262 Difficulty in walking, not elsewhere classified: Secondary | ICD-10-CM

## 2016-04-23 DIAGNOSIS — Z96652 Presence of left artificial knee joint: Secondary | ICD-10-CM

## 2016-04-23 DIAGNOSIS — M179 Osteoarthritis of knee, unspecified: Secondary | ICD-10-CM | POA: Diagnosis not present

## 2016-04-23 DIAGNOSIS — Z471 Aftercare following joint replacement surgery: Secondary | ICD-10-CM | POA: Diagnosis not present

## 2016-04-23 DIAGNOSIS — M25662 Stiffness of left knee, not elsewhere classified: Secondary | ICD-10-CM

## 2016-04-23 DIAGNOSIS — Z96659 Presence of unspecified artificial knee joint: Secondary | ICD-10-CM

## 2016-04-23 HISTORY — PX: TOTAL KNEE ARTHROPLASTY: SHX125

## 2016-04-23 SURGERY — ARTHROPLASTY, KNEE, TOTAL
Anesthesia: Regional | Site: Knee | Laterality: Left

## 2016-04-23 MED ORDER — MIDAZOLAM HCL 2 MG/2ML IJ SOLN
INTRAMUSCULAR | Status: AC
  Filled 2016-04-23: qty 2

## 2016-04-23 MED ORDER — BUPIVACAINE HCL (PF) 0.5 % IJ SOLN
INTRAMUSCULAR | Status: AC
  Filled 2016-04-23: qty 10

## 2016-04-23 MED ORDER — CHLORHEXIDINE GLUCONATE 4 % EX LIQD: 60.0000 mL | Freq: Once | CUTANEOUS | Status: DC

## 2016-04-23 MED ORDER — DEXAMETHASONE SODIUM PHOSPHATE 10 MG/ML IJ SOLN
INTRAMUSCULAR | Status: DC | PRN
  Administered 2016-04-23: 10 mg via INTRAVENOUS

## 2016-04-23 MED ORDER — PHENOL 1.4 % MT LIQD: 1.0000 | OROMUCOSAL | Status: DC | PRN

## 2016-04-23 MED ORDER — OXYCODONE-ACETAMINOPHEN 5-325 MG PO TABS: 1.0000 | ORAL_TABLET | ORAL | 0 refills | Status: DC | PRN

## 2016-04-23 MED ORDER — SODIUM CHLORIDE 0.9 % IJ SOLN
INTRAMUSCULAR | Status: DC | PRN
  Administered 2016-04-23: 40 mL

## 2016-04-23 MED ORDER — HYDROMORPHONE HCL 1 MG/ML IJ SOLN
INTRAMUSCULAR | Status: AC
  Administered 2016-04-23: 1 mg
  Filled 2016-04-23: qty 1

## 2016-04-23 MED ORDER — PROMETHAZINE HCL 25 MG/ML IJ SOLN
INTRAMUSCULAR | Status: AC
  Filled 2016-04-23: qty 1

## 2016-04-23 MED ORDER — METHOCARBAMOL 500 MG PO TABS
500.0000 mg | ORAL_TABLET | Freq: Four times a day (QID) | ORAL | Status: DC | PRN
  Administered 2016-04-23 – 2016-04-24 (×3): 500 mg via ORAL
  Filled 2016-04-23 (×4): qty 1

## 2016-04-23 MED ORDER — CEFAZOLIN SODIUM-DEXTROSE 2-4 GM/100ML-% IV SOLN
2.0000 g | Freq: Four times a day (QID) | INTRAVENOUS | Status: AC
  Administered 2016-04-23 (×2): 2 g via INTRAVENOUS
  Filled 2016-04-23 (×2): qty 100

## 2016-04-23 MED ORDER — PROPOFOL 10 MG/ML IV BOLUS
INTRAVENOUS | Status: AC
  Filled 2016-04-23: qty 20

## 2016-04-23 MED ORDER — ACETAMINOPHEN 325 MG PO TABS: 650.0000 mg | ORAL_TABLET | Freq: Four times a day (QID) | ORAL | Status: DC | PRN

## 2016-04-23 MED ORDER — ONDANSETRON HCL 4 MG PO TABS: 4.0000 mg | ORAL_TABLET | Freq: Four times a day (QID) | ORAL | 0 refills | Status: DC | PRN

## 2016-04-23 MED ORDER — ONDANSETRON HCL 4 MG/2ML IJ SOLN
INTRAMUSCULAR | Status: AC
  Filled 2016-04-23: qty 2

## 2016-04-23 MED ORDER — TIOTROPIUM BROMIDE MONOHYDRATE 18 MCG IN CAPS: 18.0000 ug | ORAL_CAPSULE | Freq: Every day | RESPIRATORY_TRACT | Status: DC

## 2016-04-23 MED ORDER — LIDOCAINE HCL (CARDIAC) 20 MG/ML IV SOLN
INTRAVENOUS | Status: DC | PRN
  Administered 2016-04-23: 50 mg via INTRAVENOUS

## 2016-04-23 MED ORDER — PROPOFOL 10 MG/ML IV BOLUS
INTRAVENOUS | Status: DC | PRN
  Administered 2016-04-23: 200 mg via INTRAVENOUS

## 2016-04-23 MED ORDER — ACETAMINOPHEN 650 MG RE SUPP: 650.0000 mg | Freq: Four times a day (QID) | RECTAL | Status: DC | PRN

## 2016-04-23 MED ORDER — DIPHENHYDRAMINE HCL 12.5 MG/5ML PO ELIX: 12.5000 mg | ORAL_SOLUTION | ORAL | Status: DC | PRN

## 2016-04-23 MED ORDER — FENTANYL CITRATE (PF) 100 MCG/2ML IJ SOLN
INTRAMUSCULAR | Status: AC
  Filled 2016-04-23: qty 2

## 2016-04-23 MED ORDER — LACTATED RINGERS IV SOLN
INTRAVENOUS | Status: DC
  Administered 2016-04-23: 09:00:00 via INTRAVENOUS

## 2016-04-23 MED ORDER — DEXAMETHASONE SODIUM PHOSPHATE 10 MG/ML IJ SOLN
INTRAMUSCULAR | Status: AC
  Filled 2016-04-23: qty 1

## 2016-04-23 MED ORDER — PHENYLEPHRINE HCL 10 MG/ML IJ SOLN
INTRAMUSCULAR | Status: DC | PRN
  Administered 2016-04-23 (×2): 80 ug via INTRAVENOUS

## 2016-04-23 MED ORDER — ONDANSETRON HCL 4 MG/2ML IJ SOLN: 4.0000 mg | Freq: Four times a day (QID) | INTRAMUSCULAR | Status: DC | PRN

## 2016-04-23 MED ORDER — ASPIRIN 325 MG PO TBEC: 325.0000 mg | DELAYED_RELEASE_TABLET | Freq: Every day | ORAL | 0 refills | Status: DC

## 2016-04-23 MED ORDER — ASPIRIN EC 325 MG PO TBEC
325.0000 mg | DELAYED_RELEASE_TABLET | Freq: Every day | ORAL | Status: DC
  Administered 2016-04-24: 325 mg via ORAL
  Filled 2016-04-23 (×2): qty 1

## 2016-04-23 MED ORDER — CELECOXIB 200 MG PO CAPS: 200.0000 mg | ORAL_CAPSULE | Freq: Two times a day (BID) | ORAL | Status: DC

## 2016-04-23 MED ORDER — POLYETHYLENE GLYCOL 3350 17 G PO PACK: 17.0000 g | PACK | Freq: Every day | ORAL | Status: DC | PRN

## 2016-04-23 MED ORDER — ONDANSETRON HCL 4 MG PO TABS: 4.0000 mg | ORAL_TABLET | Freq: Four times a day (QID) | ORAL | Status: DC | PRN

## 2016-04-23 MED ORDER — CELECOXIB 200 MG PO CAPS: 200.0000 mg | ORAL_CAPSULE | Freq: Two times a day (BID) | ORAL | 0 refills | Status: DC

## 2016-04-23 MED ORDER — BUPIVACAINE-EPINEPHRINE (PF) 0.5% -1:200000 IJ SOLN
INTRAMUSCULAR | Status: DC | PRN
  Administered 2016-04-23: 30 mL via PERINEURAL

## 2016-04-23 MED ORDER — FENTANYL CITRATE (PF) 100 MCG/2ML IJ SOLN
50.0000 ug | Freq: Once | INTRAMUSCULAR | Status: AC
  Administered 2016-04-23: 50 ug via INTRAVENOUS

## 2016-04-23 MED ORDER — LIDOCAINE 2% (20 MG/ML) 5 ML SYRINGE
INTRAMUSCULAR | Status: AC
  Filled 2016-04-23: qty 5

## 2016-04-23 MED ORDER — MAGNESIUM CITRATE PO SOLN: 1.0000 | Freq: Once | ORAL | Status: DC | PRN

## 2016-04-23 MED ORDER — BISACODYL 10 MG RE SUPP: 10.0000 mg | Freq: Every day | RECTAL | Status: DC | PRN

## 2016-04-23 MED ORDER — METOCLOPRAMIDE HCL 5 MG/ML IJ SOLN
5.0000 mg | Freq: Three times a day (TID) | INTRAMUSCULAR | Status: DC | PRN
Start: 2016-04-23 — End: 2016-04-24

## 2016-04-23 MED ORDER — BUPIVACAINE HCL 0.5 % IJ SOLN
INTRAMUSCULAR | Status: DC | PRN
  Administered 2016-04-23: 10 mL

## 2016-04-23 MED ORDER — SUGAMMADEX SODIUM 500 MG/5ML IV SOLN
INTRAVENOUS | Status: DC | PRN
  Administered 2016-04-23: 200 mg via INTRAVENOUS

## 2016-04-23 MED ORDER — ALBUTEROL SULFATE (2.5 MG/3ML) 0.083% IN NEBU: 3.0000 mL | INHALATION_SOLUTION | Freq: Four times a day (QID) | RESPIRATORY_TRACT | Status: DC | PRN

## 2016-04-23 MED ORDER — ROCURONIUM BROMIDE 10 MG/ML (PF) SYRINGE
PREFILLED_SYRINGE | INTRAVENOUS | Status: AC
  Filled 2016-04-23: qty 10

## 2016-04-23 MED ORDER — ONDANSETRON HCL 4 MG/2ML IJ SOLN
INTRAMUSCULAR | Status: DC | PRN
  Administered 2016-04-23: 4 mg via INTRAVENOUS

## 2016-04-23 MED ORDER — MENTHOL 3 MG MT LOZG: 1.0000 | LOZENGE | OROMUCOSAL | Status: DC | PRN

## 2016-04-23 MED ORDER — DEXTROSE 5 % IV SOLN
500.0000 mg | Freq: Four times a day (QID) | INTRAVENOUS | Status: DC | PRN
  Filled 2016-04-23: qty 5

## 2016-04-23 MED ORDER — DOCUSATE SODIUM 100 MG PO CAPS
100.0000 mg | ORAL_CAPSULE | Freq: Two times a day (BID) | ORAL | Status: DC
  Administered 2016-04-23 – 2016-04-24 (×2): 100 mg via ORAL
  Filled 2016-04-23 (×2): qty 1

## 2016-04-23 MED ORDER — ALUM & MAG HYDROXIDE-SIMETH 200-200-20 MG/5ML PO SUSP: 30.0000 mL | ORAL | Status: DC | PRN

## 2016-04-23 MED ORDER — METHOCARBAMOL 500 MG PO TABS
ORAL_TABLET | ORAL | Status: AC
  Filled 2016-04-23: qty 1

## 2016-04-23 MED ORDER — METHOCARBAMOL 500 MG PO TABS: 500.0000 mg | ORAL_TABLET | Freq: Four times a day (QID) | ORAL | 0 refills | Status: DC | PRN

## 2016-04-23 MED ORDER — FENTANYL CITRATE (PF) 100 MCG/2ML IJ SOLN
INTRAMUSCULAR | Status: AC
  Administered 2016-04-23: 50 ug via INTRAVENOUS
  Filled 2016-04-23: qty 2

## 2016-04-23 MED ORDER — SUCCINYLCHOLINE CHLORIDE 20 MG/ML IJ SOLN
INTRAMUSCULAR | Status: DC | PRN
  Administered 2016-04-23: 100 mg via INTRAVENOUS

## 2016-04-23 MED ORDER — POTASSIUM CHLORIDE IN NACL 20-0.9 MEQ/L-% IV SOLN
INTRAVENOUS | Status: DC
  Administered 2016-04-23: 17:00:00 via INTRAVENOUS
  Filled 2016-04-23: qty 1000

## 2016-04-23 MED ORDER — ROCURONIUM BROMIDE 100 MG/10ML IV SOLN
INTRAVENOUS | Status: DC | PRN
  Administered 2016-04-23: 10 mg via INTRAVENOUS
  Administered 2016-04-23: 50 mg via INTRAVENOUS

## 2016-04-23 MED ORDER — FENTANYL CITRATE (PF) 100 MCG/2ML IJ SOLN
INTRAMUSCULAR | Status: DC | PRN
  Administered 2016-04-23: 100 ug via INTRAVENOUS

## 2016-04-23 MED ORDER — OXYCODONE-ACETAMINOPHEN 5-325 MG PO TABS
1.0000 | ORAL_TABLET | ORAL | Status: DC | PRN
  Administered 2016-04-23 – 2016-04-24 (×5): 2 via ORAL
  Filled 2016-04-23 (×6): qty 2

## 2016-04-23 MED ORDER — DOCUSATE SODIUM 100 MG PO CAPS: 100.0000 mg | ORAL_CAPSULE | Freq: Two times a day (BID) | ORAL | 0 refills | Status: DC

## 2016-04-23 MED ORDER — DEXAMETHASONE SODIUM PHOSPHATE 10 MG/ML IJ SOLN
10.0000 mg | Freq: Once | INTRAMUSCULAR | Status: AC
  Administered 2016-04-24: 10 mg via INTRAVENOUS
  Filled 2016-04-23: qty 1

## 2016-04-23 MED ORDER — SODIUM CHLORIDE 0.9 % IR SOLN
Status: DC | PRN
  Administered 2016-04-23: 3000 mL

## 2016-04-23 MED ORDER — SUCCINYLCHOLINE CHLORIDE 200 MG/10ML IV SOSY
PREFILLED_SYRINGE | INTRAVENOUS | Status: AC
  Filled 2016-04-23: qty 10

## 2016-04-23 MED ORDER — METOCLOPRAMIDE HCL 5 MG PO TABS
5.0000 mg | ORAL_TABLET | Freq: Three times a day (TID) | ORAL | Status: DC | PRN
Start: 2016-04-23 — End: 2016-04-24

## 2016-04-23 MED ORDER — BUPIVACAINE LIPOSOME 1.3 % IJ SUSP
20.0000 mL | INTRAMUSCULAR | Status: AC
  Administered 2016-04-23: 20 mL
  Filled 2016-04-23: qty 20

## 2016-04-23 MED ORDER — FENTANYL CITRATE (PF) 100 MCG/2ML IJ SOLN
25.0000 ug | INTRAMUSCULAR | Status: DC | PRN
  Administered 2016-04-23 (×2): 50 ug via INTRAVENOUS

## 2016-04-23 MED ORDER — PROMETHAZINE HCL 25 MG/ML IJ SOLN
6.2500 mg | INTRAMUSCULAR | Status: DC | PRN
  Administered 2016-04-23: 12.5 mg via INTRAVENOUS

## 2016-04-23 MED ORDER — HYDROMORPHONE HCL 1 MG/ML IJ SOLN
0.5000 mg | INTRAMUSCULAR | Status: DC | PRN
  Administered 2016-04-23 – 2016-04-24 (×3): 1 mg via INTRAVENOUS
  Filled 2016-04-23 (×3): qty 1

## 2016-04-23 SURGICAL SUPPLY — 65 items
APL SKNCLS STERI-STRIP NONHPOA (GAUZE/BANDAGES/DRESSINGS) ×1
BANDAGE ACE 4X5 VEL STRL LF (GAUZE/BANDAGES/DRESSINGS) ×3 IMPLANT
BANDAGE ACE 6X5 VEL STRL LF (GAUZE/BANDAGES/DRESSINGS) ×3 IMPLANT
BANDAGE ESMARK 6X9 LF (GAUZE/BANDAGES/DRESSINGS) ×1 IMPLANT
BENZOIN TINCTURE PRP APPL 2/3 (GAUZE/BANDAGES/DRESSINGS) ×3 IMPLANT
BLADE SAG 18X100X1.27 (BLADE) ×6 IMPLANT
BNDG CMPR 9X6 STRL LF SNTH (GAUZE/BANDAGES/DRESSINGS) ×1
BNDG ESMARK 6X9 LF (GAUZE/BANDAGES/DRESSINGS) ×3
BOWL SMART MIX CTS (DISPOSABLE) ×3 IMPLANT
CAPT KNEE TOTAL 3 ×2 IMPLANT
CEMENT BONE SIMPLEX SPEEDSET (Cement) ×6 IMPLANT
CLOSURE WOUND 1/2 X4 (GAUZE/BANDAGES/DRESSINGS) ×1
COVER SURGICAL LIGHT HANDLE (MISCELLANEOUS) ×3 IMPLANT
CUFF TOURNIQUET SINGLE 34IN LL (TOURNIQUET CUFF) ×3 IMPLANT
DRAPE EXTREMITY T 121X128X90 (DRAPE) ×3 IMPLANT
DRAPE IMP U-DRAPE 54X76 (DRAPES) ×3 IMPLANT
DRAPE PROXIMA HALF (DRAPES) ×3 IMPLANT
DRAPE U-SHAPE 47X51 STRL (DRAPES) ×3 IMPLANT
DRSG AQUACEL AG ADV 3.5X10 (GAUZE/BANDAGES/DRESSINGS) ×3 IMPLANT
DURAPREP 26ML APPLICATOR (WOUND CARE) ×6 IMPLANT
ELECT CAUTERY BLADE 6.4 (BLADE) ×3 IMPLANT
ELECT REM PT RETURN 9FT ADLT (ELECTROSURGICAL) ×3
ELECTRODE REM PT RTRN 9FT ADLT (ELECTROSURGICAL) ×1 IMPLANT
EVACUATOR 1/8 PVC DRAIN (DRAIN) IMPLANT
FACESHIELD WRAPAROUND (MASK) ×6 IMPLANT
FACESHIELD WRAPAROUND OR TEAM (MASK) ×2 IMPLANT
GLOVE BIOGEL PI IND STRL 7.0 (GLOVE) ×1 IMPLANT
GLOVE BIOGEL PI INDICATOR 7.0 (GLOVE) ×4
GLOVE ORTHO TXT STRL SZ7.5 (GLOVE) ×3 IMPLANT
GLOVE SURG ORTHO 7.0 STRL STRW (GLOVE) ×3 IMPLANT
GOWN STRL REUS W/ TWL LRG LVL3 (GOWN DISPOSABLE) ×2 IMPLANT
GOWN STRL REUS W/ TWL XL LVL3 (GOWN DISPOSABLE) ×1 IMPLANT
GOWN STRL REUS W/TWL LRG LVL3 (GOWN DISPOSABLE) ×6
GOWN STRL REUS W/TWL XL LVL3 (GOWN DISPOSABLE) ×3
HANDPIECE INTERPULSE COAX TIP (DISPOSABLE) ×3
IMMOBILIZER KNEE 22 UNIV (SOFTGOODS) ×3 IMPLANT
IMMOBILIZER KNEE 24 THIGH 36 (MISCELLANEOUS) IMPLANT
IMMOBILIZER KNEE 24 UNIV (MISCELLANEOUS)
KIT BASIN OR (CUSTOM PROCEDURE TRAY) ×3 IMPLANT
KIT ROOM TURNOVER OR (KITS) ×3 IMPLANT
MANIFOLD NEPTUNE II (INSTRUMENTS) ×3 IMPLANT
NDL 18GX1X1/2 (RX/OR ONLY) (NEEDLE) ×1 IMPLANT
NDL HYPO 25GX1X1/2 BEV (NEEDLE) ×1 IMPLANT
NEEDLE 18GX1X1/2 (RX/OR ONLY) (NEEDLE) ×3 IMPLANT
NEEDLE HYPO 25GX1X1/2 BEV (NEEDLE) ×3 IMPLANT
NS IRRIG 1000ML POUR BTL (IV SOLUTION) ×3 IMPLANT
PACK TOTAL JOINT (CUSTOM PROCEDURE TRAY) ×3 IMPLANT
PACK UNIVERSAL I (CUSTOM PROCEDURE TRAY) ×3 IMPLANT
PAD ARMBOARD 7.5X6 YLW CONV (MISCELLANEOUS) ×6 IMPLANT
SET HNDPC FAN SPRY TIP SCT (DISPOSABLE) ×1 IMPLANT
STRIP CLOSURE SKIN 1/2X4 (GAUZE/BANDAGES/DRESSINGS) ×3 IMPLANT
SUCTION FRAZIER HANDLE 10FR (MISCELLANEOUS) ×2
SUCTION TUBE FRAZIER 10FR DISP (MISCELLANEOUS) ×1 IMPLANT
SUT MNCRL AB 4-0 PS2 18 (SUTURE) ×3 IMPLANT
SUT VIC AB 0 CT1 27 (SUTURE)
SUT VIC AB 0 CT1 27XBRD ANBCTR (SUTURE) IMPLANT
SUT VIC AB 1 CTX 36 (SUTURE) ×3
SUT VIC AB 1 CTX36XBRD ANBCTR (SUTURE) ×1 IMPLANT
SUT VIC AB 2-0 CT1 27 (SUTURE) ×6
SUT VIC AB 2-0 CT1 TAPERPNT 27 (SUTURE) ×2 IMPLANT
SYR 50ML LL SCALE MARK (SYRINGE) ×3 IMPLANT
SYR CONTROL 10ML LL (SYRINGE) ×3 IMPLANT
TOWEL OR 17X24 6PK STRL BLUE (TOWEL DISPOSABLE) ×3 IMPLANT
TOWEL OR 17X26 10 PK STRL BLUE (TOWEL DISPOSABLE) ×3 IMPLANT
WATER STERILE IRR 1000ML POUR (IV SOLUTION) ×3 IMPLANT

## 2016-04-23 NOTE — Progress Notes (Signed)
Orthopedic Tech Progress Note Patient Details:  Craig Mccann 1951/05/15 [id number]      [name] 04/23/2016, 2:00 PM cpo footsie roll trapeze bar patient helper; viewed order from doctor's order list

## 2016-04-23 NOTE — Interval H&P Note (Signed)
History and Physical Interval Note:  04/23/2016 8:31 AM  Craig Mccann  has presented today for surgery, with the diagnosis of DJD LEFT KNEE  The various methods of treatment have been discussed with the patient and family. After consideration of risks, benefits and other options for treatment, the patient has consented to  Procedure(s): TOTAL KNEE ARTHROPLASTY (Left) as a surgical intervention .  The patient's history has been reviewed, patient examined, no change in status, stable for surgery.  I have reviewed the patient's chart and labs.  Questions were answered to the patient's satisfaction.     Craig Mccann

## 2016-04-23 NOTE — Anesthesia Procedure Notes (Signed)
Anesthesia Regional Block:  Adductor canal block  Pre-Anesthetic Checklist: ,, timeout performed, Correct Patient, Correct Site, Correct Laterality, Correct Procedure, Correct Position, site marked, Risks and benefits discussed,  Surgical consent,  Pre-op evaluation,  At surgeon's request and post-op pain management  Laterality: Left  Prep: chloraprep       Needles:  Injection technique: Single-shot  Needle Type: Echogenic Needle     Needle Length: 9cm 9 cm Needle Gauge: 21 and 21 G    Additional Needles:  Procedures: ultrasound guided (picture in chart) Adductor canal block Narrative:  Injection made incrementally with aspirations every 5 mL.  Performed by: Personally  Anesthesiologist: TURK, STEPHEN EDWARD  Additional Notes: No pain on injection. No increased resistance to injection. Injection made in 5cc increments.  Good needle visualization.  Patient tolerated procedure well.      

## 2016-04-23 NOTE — Progress Notes (Signed)
Orthopedic Tech Progress Note Patient Details:  Craig Mccann Jun 17, 1951 [id number]  CPM Left Knee CPM Left Knee: On Left Knee Flexion (Degrees): 90 Left Knee Extension (Degrees): 0 Additional Comments:  (tolerating well)   [name] 04/23/2016, 5:33 PM

## 2016-04-23 NOTE — Discharge Summary (Signed)
Patient ID: Craig Mccann MRN: [id number] DOB/AGE: Feb 07, 1951 65 y.o.  Admit date: 04/23/2016 Discharge date: 04/23/2016  Admission Diagnoses:  Active Problems:   S/P total knee arthroplasty   Discharge Diagnoses:  Same  Past Medical History:  Diagnosis Date  . Acute prostatitis 01/2015  . Basal cell carcinoma 2017   Multiple: back, left shin, left arm  . CAP (community acquired pneumonia) 06/26/2015  . Chronic renal insufficiency, stage II (mild)    stage II/II: CrCl 60s.  . Complication of anesthesia   . COPD (chronic obstructive pulmonary disease) ([place]) 05/07/15   Long time smoker + COPD changes noted on lung ca screening CT  . Diverticulitis   . Family history of colon cancer in mother   . Hearing impairment    hearing aids  . History of adenomatous polyp of colon 12/24/10  . Kidney stones    bilat nonobstrucing renal calculi 2016, + left benign renal cysts  . Melanoma ([place])    R posterolateral neck and R nasal ala.  Most recent was left mid back 01/2016 (Dr. Tarri Glenn).  [name] Spinal headache   . Tobacco dependence    CT chest for lung ca screning 04/2015 showed benign findings; 1 yr repeat recommended    Surgeries: Procedure(s): TOTAL KNEE ARTHROPLASTY on 04/23/2016   Consultants:   Discharged Condition: Improved  Hospital Course: Craig Mccann is an 65 y.o. male who was admitted 04/23/2016 for operative treatment of<principal problem not specified>. Patient has severe unremitting pain that affects sleep, daily activities, and work/hobbies. After pre-op clearance the patient was taken to the operating room on 04/23/2016 and underwent  Procedure(s): TOTAL KNEE ARTHROPLASTY.  Patient mad excellent post operative progress in meeting PT goals for safe transfers and ambulation  Patient was given perioperative antibiotics: Anti-infectives    Start     Dose/Rate Route Frequency Ordered Stop   04/23/16 1630  ceFAZolin (ANCEF) IVPB 2g/100 mL premix     2 g 200 mL/hr over 30  Minutes Intravenous Every 6 hours 04/23/16 1609 04/24/16 0429   04/23/16 0930  ceFAZolin (ANCEF) 3 g in dextrose 5 % 50 mL IVPB     3 g 130 mL/hr over 30 Minutes Intravenous To ShortStay Surgical 04/22/16 1025 04/23/16 1039       Patient was given sequential compression devices, early ambulation, and chemoprophylaxis to prevent DVT.  Patient benefited maximally from hospital stay and there were no complications.    Recent vital signs: Patient Vitals for the past 24 hrs:  BP Temp Temp src Pulse Resp SpO2 Height Weight  04/23/16 2023 114/79 97.8 F (36.6 C) Oral 96 17 91 % - -  04/23/16 1813 127/80 - - 92 - - - -  04/23/16 1720 128/76 98.3 F (36.8 C) Oral 94 18 94 % - -  04/23/16 1600 - 97.2 F (36.2 C) - - - - - -  04/23/16 1545 - - - 84 19 98 % - -  04/23/16 1433 122/80 97 F (36.1 C) - - - - - -  04/23/16 1245 - - - 93 14 98 % - -  04/23/16 1232 - 98 F (36.7 C) - - - - - -  04/23/16 1230 116/76 - - 77 14 96 % - -  04/23/16 1000 - - - 71 20 94 % - -  04/23/16 0920 137/85 - - 75 (!) 22 95 % - -  04/23/16 0913 132/90 - - 78 (!) 21 96 % - -  04/23/16 0910 - - -  73 (!) 21 96 % - -  04/23/16 0908 (!) 141/98 - - 70 (!) 23 96 % - -  04/23/16 0905 - - - 76 15 97 % - -  04/23/16 [id number] (!) 144/98 98.1 F (36.7 C) Oral 74 18 95 % 6' 1.5" (1.867 m) 120.2 kg (265 lb 1 oz)     Recent laboratory studies: No results for input(s): WBC, HGB, HCT, PLT, NA, K, CL, CO2, BUN, CREATININE, GLUCOSE, INR, CALCIUM in the last 72 hours.  Invalid input(s): PT, 2   Discharge Medications:     Medication List    TAKE these medications   albuterol 108 (90 Base) MCG/ACT inhaler Commonly known as:  PROAIR HFA Inhale 2 puffs into the lungs every 6 (six) hours as needed for wheezing or shortness of breath.   aspirin 325 MG EC tablet Take 1 tablet (325 mg total) by mouth daily with breakfast. Start taking on:  04/24/2016   celecoxib 200 MG capsule Commonly known as:  CELEBREX Take 1 capsule (200  mg total) by mouth every 12 (twelve) hours. Start taking on:  04/24/2016   docusate sodium 100 MG capsule Commonly known as:  COLACE Take 1 capsule (100 mg total) by mouth 2 (two) times daily. Start taking on:  04/24/2016   methocarbamol 500 MG tablet Commonly known as:  ROBAXIN Take 1 tablet (500 mg total) by mouth every 6 (six) hours as needed for muscle spasms.   ondansetron 4 MG tablet Commonly known as:  ZOFRAN Take 1 tablet (4 mg total) by mouth every 6 (six) hours as needed for nausea.   oxyCODONE-acetaminophen 5-325 MG tablet Commonly known as:  PERCOCET/ROXICET Take 1-2 tablets by mouth every 4 (four) hours as needed for moderate pain.   tiotropium 18 MCG inhalation capsule Commonly known as:  SPIRIVA HANDIHALER Place 1 capsule (18 mcg total) into inhaler and inhale daily.       Diagnostic Studies: Dg Knee Left Port  Result Date: 04/23/2016 CLINICAL DATA:  Post knee replacement EXAM: PORTABLE LEFT KNEE - 1-2 VIEW COMPARISON:  None. FINDINGS: Two portable views of the left knee show the femoral and tibial components of the left total knee replacement to be in good position and alignment. No complicating features are seen. A small amount air is noted in the soft tissues and joint space postoperatively. 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 No complicating features. Electronically Signed   By: Ivar Drape M.D.   On: 04/23/2016 13:33    Disposition: Final discharge disposition not confirmed  Discharge Instructions    CPM    Complete by:  As directed    Continuous passive motion machine (CPM):      Use the CPM from 0 to 60 for 6-8 hours per day.      You may increase by 5-10 per day.  You may break it up into 2 or 3 sessions per day.      Use CPM for 3-4 weeks or until you are told to stop.   Call MD / Call 911    Complete by:  As directed    If you experience chest pain or shortness of breath, CALL 911 and be transported to the hospital  emergency room.  If you develope a fever above 101 F, pus (white drainage) or increased drainage or redness at the wound, or calf pain, call your surgeon's office.   Change dressing    Complete by:  As directed  Change the dressing daily with sterile 4 x 4 inch gauze dressing and apply TED hose.  You may clean the incision with alcohol prior to redressing.   Constipation Prevention    Complete by:  As directed    Drink plenty of fluids.  Prune juice and/or coffee may be helpful.  You may use a stool softener, such as Colace (over the counter) 100 mg twice a day.  Use MiraLax (over the counter) for constipation as needed but this may take several days to work.  Mag Citrate --OR-- Milk of Magnesia may also be used but follow directions on the label.   Diet - low sodium heart healthy    Complete by:  As directed    Discharge instructions    Complete by:  As directed    1 tab a day for the next 30 days to prevent blood clots 1 tab 2 times a day while on narcotics.  STOOL SOFTENERContinuous passive motion machine (CPM):      Use the CPM from 0 to 90 for 6 hours per day.       You may break it up into 2 or 3 sessions per day.      Use CPM for 2 weeks or until you are told to stop.Change the dressing daily with sterile 4 x 4 inch gauze dressing and apply TED hose.  You may clean the incision with alcohol prior to redressing.Place gray foam block, curve side up under heel at all times except when in CPM or when walking.  DO NOT modify, tear, cut, or change in any way the gray foam block.   Do not put a pillow under the knee. Place it under the heel.    Complete by:  As directed    Place yellow block under heel at all times except when up walking or in CPM.  You must sleep in it at night   Increase activity slowly as tolerated    Complete by:  As directed    Patient may shower    Complete by:  As directed    You may shower over the brown dressing.  Once the dressing is removed you may shower without  a dressing once there is no drainage.  Do not wash over the wound.  If drainage remains, cover wound with plastic wrap and then shower   TED hose    Complete by:  As directed    Use stockings (TED hose) for 2 weeks on both leg(s).  You may remove them at night for sleeping.      Follow-up Information    Ninetta Lights, MD. Schedule an appointment as soon as possible for a visit in 2 week(s).   Specialty:  Orthopedic Surgery Contact information: [address] [place] [address] [phone]            Signed: Benedetto Goad 04/23/2016, 10:29 PM

## 2016-04-23 NOTE — Discharge Instructions (Signed)

## 2016-04-23 NOTE — Transfer of Care (Signed)
Immediate Anesthesia Transfer of Care Note  Patient: Craig Mccann  Procedure(s) Performed: Procedure(s): TOTAL KNEE ARTHROPLASTY (Left)  Patient Location: PACU  Anesthesia Type:General  Level of Consciousness: alert  and sedated  Airway & Oxygen Therapy: Patient Spontanous Breathing and Patient connected to nasal cannula oxygen  Post-op Assessment: Post -op Vital signs reviewed and stable  Post vital signs: stable  Last Vitals:  Vitals:   04/23/16 0913 04/23/16 0920  BP: 132/90 137/85  Pulse: 78 75  Resp: (!) 21 (!) 22  Temp:      Last Pain:  Vitals:   04/23/16 0812  TempSrc: Oral         Complications: No apparent anesthesia complications

## 2016-04-23 NOTE — Anesthesia Procedure Notes (Signed)
Procedure Name: Intubation Date/Time: 04/23/2016 10:17 AM Performed by: Lavell Luster Pre-anesthesia Checklist: Patient identified, Emergency Drugs available, Suction available, Timeout performed and Patient being monitored Patient Re-evaluated:Patient Re-evaluated prior to inductionOxygen Delivery Method: Circle system utilized Preoxygenation: Pre-oxygenation with 100% oxygen Intubation Type: IV induction Ventilation: Mask ventilation without difficulty Laryngoscope Size: Mac and 3 Grade View: Grade I Tube type: Oral Tube size: 7.5 mm Number of attempts: 1 Airway Equipment and Method: Stylet Placement Confirmation: ETT inserted through vocal cords under direct vision,  positive ETCO2 and breath sounds checked- equal and bilateral Secured at: 22 cm Tube secured with: Tape Dental Injury: Teeth and Oropharynx as per pre-operative assessment

## 2016-04-23 NOTE — Evaluation (Signed)
Physical Therapy Evaluation Patient Details Name: Craig Mccann MRN: [id number] DOB: August 14, 1950 Today's Date: 04/23/2016   History of Present Illness  65 y.o. male admitted to [facility] on 04/23/16 for elective L TKA.  PMhx significant for bil knee surgeries, HOH, COPD, chronic renal insufficiency, bil thumb surgery, and neck surgery.  Clinical Impression  Pt is POD #0 and is limited by lightheadedness.  He was able to move OOB to chair with RW and overall, min assist.  He will likely progress well enough to d/c home with wife's assist and HHPT.   PT to follow acutely for deficits listed below.       Follow Up Recommendations Home health PT;Supervision for mobility/OOB    Equipment Recommendations  None recommended by PT    Recommendations for Other Services   NA    Precautions / Restrictions Precautions Precautions: Knee Precaution Booklet Issued: Yes (comment) Precaution Comments: knee handout given, precautions reviewed with wife Required Braces or Orthoses: Knee Immobilizer - Left Knee Immobilizer - Left: Other (comment) (no orders for, but used due to nerve block) Restrictions Weight Bearing Restrictions: Yes LLE Weight Bearing: Weight bearing as tolerated      Mobility  Bed Mobility Overal bed mobility: Needs Assistance Bed Mobility: Supine to Sit     Supine to sit: HOB elevated;Min assist     General bed mobility comments: Min assist to help progress left leg over EOB.  Pt using railing and HOB elevated >30 degrees  Transfers Overall transfer level: Needs assistance Equipment used: Rolling walker (2 wheeled) Transfers: Sit to/from Stand Sit to Stand: Min assist;From elevated surface         General transfer comment: Min assist from elevated bed, verbal cues for safe hand placement during transitions.   Ambulation/Gait Ambulation/Gait assistance: Min assist Ambulation Distance (Feet): 3 Feet Assistive device: Rolling walker (2 wheeled) Gait  Pattern/deviations: Step-to pattern (hop to, pt not putting weight through left foot)     General Gait Details: Pt hopped over to the recliner chair.  I reinforced that he could put weight through his foot, used KI this evening for first time up.  Verbal cues for safe RW use.          Balance Overall balance assessment: Needs assistance Sitting-balance support: Feet supported;Bilateral upper extremity supported Sitting balance-Leahy Scale: Fair     Standing balance support: Bilateral upper extremity supported Standing balance-Leahy Scale: Poor                               Pertinent Vitals/Pain Pain Assessment: 0-10 Pain Score: 5  Pain Location: left knee Pain Descriptors / Indicators: Aching;Burning Pain Intervention(s): Limited activity within patient's tolerance;Monitored during session;Repositioned    Home Living Family/patient expects to be discharged to:: Private residence Living Arrangements: Spouse/significant other Available Help at Discharge: Family;Available 24 hours/day (wife took 1.5 weeks off work) Type of Home: House Home Access: Stairs to enter Entrance Stairs-Rails: Right Entrance Stairs-Number of Steps: 2 Home Layout: One level Home Equipment: Environmental consultant - 2 wheels;Bedside commode;Shower seat - built in      Prior Function Level of Independence: Independent         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  He can feel his left foot, wiggles toes, ankle DF at least 3/5, knee 2-/5, hip 2/5  Cervical / Trunk Assessment: Other exceptions  Communication   Communication: HOH  Cognition Arousal/Alertness: Lethargic;Suspect due to medications Behavior During Therapy: [facility] for tasks assessed/performed Overall Cognitive Status: Within  Functional Limits for tasks assessed                      General Comments General comments (skin integrity, edema, etc.): Pt lightheaded EOB and when OOB in chair.  BP check and stable as well as HR.      Exercises Total Joint Exercises Ankle Circles/Pumps: AROM;Both;20 reps   Assessment/Plan    PT Assessment Patient needs continued PT services  PT Problem List Decreased strength;Decreased range of motion;Decreased activity tolerance;Decreased balance;Decreased mobility;Decreased knowledge of use of DME;Decreased knowledge of precautions;Pain          PT Treatment Interventions DME instruction;Gait training;Stair training;Functional mobility training;Therapeutic activities;Therapeutic exercise;Balance training;Patient/family education;Manual techniques;Modalities    PT Goals (Current goals can be found in the Care Plan section)  Acute Rehab PT Goals Patient Stated Goal: to go home tomorrow evening PT Goal Formulation: With patient/family Time For Goal Achievement: 04/30/16 Potential to Achieve Goals: Good    Frequency 7X/week           End of Session Equipment Utilized During Treatment: Gait belt;Left knee immobilizer Activity Tolerance: Patient limited by fatigue;Patient limited by pain (limited by lightheadedness) Patient left: in chair;with call bell/phone within reach           Time: [id number] PT Time Calculation (min) (ACUTE ONLY): 23 min   Charges:   PT Evaluation $PT Eval Moderate Complexity: 1 Procedure PT Treatments $Therapeutic Activity: 8-22 mins        Phynix Horton B. [place], [place], DPT [phone]   04/23/2016, 6:23 PM

## 2016-04-23 NOTE — H&P (View-Only) (Signed)
TOTAL KNEE ADMISSION H&P  Patient is being admitted for left total knee arthroplasty.  Subjective:  Chief Complaint:left knee pain.  HPI: Craig Mccann, 65 y.o. male, has a history of pain and functional disability in the left knee due to arthritis and has failed non-surgical conservative treatments for greater than 12 weeks to includeNSAID's and/or analgesics, corticosteriod injections and activity modification.  Onset of symptoms was abrupt, starting 6 years ago with rapidlly worsening course since that time. The patient noted prior procedures on the knee to include  arthroscopy and menisectomy on the left knee(s).  Patient currently rates pain in the left knee(s) at 10 out of 10 with activity. Patient has night pain, worsening of pain with activity and weight bearing, pain that interferes with activities of daily living, pain with passive range of motion, crepitus and joint swelling.  Patient has evidence of subchondral sclerosis and joint space narrowing by imaging studies. There is no active infection.  Patient Active Problem List   Diagnosis Date Noted  . COPD (chronic obstructive pulmonary disease) ([place]) 11/08/2015  . Mid back pain 08/15/2015   Past Medical History:  Diagnosis Date  . Acute prostatitis 01/2015  . Basal cell carcinoma 2017   Multiple: back, left shin, left arm  . CAP (community acquired pneumonia) 06/26/2015  . Chronic renal insufficiency, stage II (mild)    stage II/II: CrCl 60s.  [name] COPD (chronic obstructive pulmonary disease) ([place]) 05/07/15   Long time smoker + COPD changes noted on lung ca screening CT  . Diverticulitis   . Family history of colon cancer in mother   . Hearing impairment    hearing aids  . History of adenomatous polyp of colon 12/24/10  . Kidney stones    bilat nonobstrucing renal calculi 2016, + left benign renal cysts  . Melanoma ([place])    R posterolateral neck and R nasal ala.  Most recent was left mid back 01/2016 (Dr. Tarri Glenn).  .  Tobacco dependence    CT chest for lung ca screning 04/2015 showed benign findings; 1 yr repeat recommended    Past Surgical History:  Procedure Laterality Date  . CHOLECYSTECTOMY  1987  . COLONOSCOPY  12/24/10; 2014   Multiple TCSs: pt on 5 yr recall, next due 2019.  . INGUINAL HERNIA REPAIR  1984  . KNEE SURGERY Left 2015   cartilage repair  . KNEE SURGERY Right 1997   "          "  . [place]  . Thumb surgery Bilateral 2005   and 2006     (Not in a hospital admission) Allergies  Allergen Reactions  . Codeine Hives  . Valium [Diazepam] Anaphylaxis    Social History  Substance Use Topics  . Smoking status: Current Every Day Smoker    Packs/day: 0.25    Years: 35.00    Types: Cigarettes  . Smokeless tobacco: Never Used  . Alcohol use No    Family History  Problem Relation Age of Onset  . Colon cancer Mother   . Lung cancer Father   . Brain cancer Brother      Review of Systems  Constitutional: Negative.   HENT: Negative.   Eyes: Negative.   Respiratory: Positive for cough and sputum production. Negative for hemoptysis, shortness of breath and wheezing.   Cardiovascular: Negative.   Gastrointestinal: Negative.   Genitourinary: Negative.   Musculoskeletal: Positive for joint pain.  Skin: Negative.   Neurological: Negative.   Endo/Heme/Allergies: Negative.  Psychiatric/Behavioral: Negative.     Objective:  Physical Exam  Constitutional: He is oriented to person, place, and time. He appears well-developed and well-nourished.  HENT:  Head: Normocephalic and atraumatic.  Eyes: EOM are normal. Pupils are equal, round, and reactive to light.  Neck: Normal range of motion. Neck supple.  Cardiovascular: Normal rate and regular rhythm.   Respiratory: Effort normal and breath sounds normal.  GI: Soft. Bowel sounds are normal.  Musculoskeletal:  Examination of his left knee reveals range of motion from 0-120 degrees.  Medial joint line tenderness.   Positive anterior drawer.  Stable to valgus and varus stress.  He is neurovascularly intact distally.    Neurological: He is alert and oriented to person, place, and time.  Skin: Skin is warm and dry.  Psychiatric: He has a normal mood and affect. His behavior is normal. Judgment and thought content normal.    Vital signs in last 24 hours: @VSRANGES @  Labs:   Estimated body mass index is 34.75 kg/m as calculated from the following:   Height as of 04/02/16: 6' 1.5" (1.867 m).   Weight as of 04/02/16: 121.1 kg (267 lb).   Imaging Review Plain radiographs demonstrate severe degenerative joint disease of the left knee(s). The overall alignment ismild varus. The bone quality appears to be fair for age and reported activity level.  Assessment/Plan:  End stage arthritis, left knee   The patient history, physical examination, clinical judgment of the provider and imaging studies are consistent with end stage degenerative joint disease of the left knee(s) and total knee arthroplasty is deemed medically necessary. The treatment options including medical management, injection therapy arthroscopy and arthroplasty were discussed at length. The risks and benefits of total knee arthroplasty were presented and reviewed. The risks due to aseptic loosening, infection, stiffness, patella tracking problems, thromboembolic complications and other imponderables were discussed. The patient acknowledged the explanation, agreed to proceed with the plan and consent was signed. Patient is being admitted for inpatient treatment for surgery, pain control, PT, OT, prophylactic antibiotics, VTE prophylaxis, progressive ambulation and ADL's and discharge planning. The patient is planning to be discharged home with home health services

## 2016-04-24 ENCOUNTER — Encounter (HOSPITAL_COMMUNITY): Payer: Self-pay | Admitting: Orthopedic Surgery

## 2016-04-24 ENCOUNTER — Other Ambulatory Visit: Payer: Self-pay | Admitting: Physician Assistant

## 2016-04-24 DIAGNOSIS — M1712 Unilateral primary osteoarthritis, left knee: Secondary | ICD-10-CM

## 2016-04-24 LAB — CBC
HEMATOCRIT: 40.3 % (ref 39.0–52.0)
Hemoglobin: 12.8 g/dL — ABNORMAL LOW (ref 13.0–17.0)
MCH: 29.7 pg (ref 26.0–34.0)
MCHC: 31.8 g/dL (ref 30.0–36.0)
MCV: 93.5 fL (ref 78.0–100.0)
PLATELETS: 238 10*3/uL (ref 150–400)
RBC: 4.31 MIL/uL (ref 4.22–5.81)
RDW: 13.7 % (ref 11.5–15.5)
WBC: 16.7 10*3/uL — AB (ref 4.0–10.5)

## 2016-04-24 LAB — BASIC METABOLIC PANEL
ANION GAP: 8 (ref 5–15)
BUN: 17 mg/dL (ref 6–20)
CO2: 27 mmol/L (ref 22–32)
Calcium: 9.1 mg/dL (ref 8.9–10.3)
Chloride: 102 mmol/L (ref 101–111)
Creatinine, Ser: 1.36 mg/dL — ABNORMAL HIGH (ref 0.61–1.24)
GFR, EST NON AFRICAN AMERICAN: 53 mL/min — AB (ref >60)
GLUCOSE: 151 mg/dL — AB (ref 65–99)
POTASSIUM: 5.2 mmol/L — AB (ref 3.5–5.1)
Sodium: 137 mmol/L (ref 135–145)

## 2016-04-24 NOTE — Evaluation (Signed)
Occupational Therapy Evaluation and Discharge Patient Details Name: Craig Mccann MRN: [id number] DOB: April 26, 1951 Today's Date: 04/24/2016    History of Present Illness 65 y.o. male admitted to [facility] on 04/23/16 for elective L TKA.  PMhx significant for bil knee surgeries, HOH, COPD, chronic renal insufficiency, bil thumb surgery, and neck surgery.   Clinical Impression   PTA pt independent in ADL and IADL. Pt pleasant and willing to work with therapy. Pt with anticipated deficits post-op - please see deficit list below. Pt able to mobilize and perform ADL standing at sink, and has willing and able wife to help with LB dressing/bathing. Pt educated on use of 3 in 1 as shower chair, and Pt had no further questions or concerns for transitioning home. Pt at adequate level from OT perspective to d/c home.    Follow Up Recommendations  No OT follow up;Supervision/Assistance - 24 hour    Equipment Recommendations  3 in 1 bedside comode    Recommendations for Other Services       Precautions / Restrictions Precautions Precautions: Knee Precaution Booklet Issued: Yes (comment) Precaution Comments: knee handout previously given, precautions reviewed with Pt Required Braces or Orthoses: Knee Immobilizer - Left Knee Immobilizer - Left: Other (comment) (no order educated on use at night to avoid knee flexion when) Restrictions Weight Bearing Restrictions: Yes LLE Weight Bearing: Weight bearing as tolerated      Mobility Bed Mobility Overal bed mobility: Needs Assistance Bed Mobility: Supine to Sit     Supine to sit: HOB elevated;Supervision     General bed mobility comments: min guard for safety, but no assistance needed, Pt using railing and HOB raised  Transfers Overall transfer level: Needs assistance Equipment used: Rolling walker (2 wheeled) Transfers: Sit to/from Stand Sit to Stand: Modified independent (Device/Increase time)         General transfer comment: Pt  with safe hand placement for sit to stand with RW.    Balance Overall balance assessment: Needs assistance Sitting-balance support: No upper extremity supported;Feet supported Sitting balance-Leahy Scale: Fair     Standing balance support: Bilateral upper extremity supported;During functional activity Standing balance-Leahy Scale: Fair                              ADL Overall ADL's : Needs assistance/impaired Eating/Feeding: Set up;Sitting   Grooming: Wash/dry face;Oral care;Wash/dry hands;Set up;Standing Grooming Details (indicate cue type and reason): with RW at sink level         Upper Body Dressing : Set up;Sitting Upper Body Dressing Details (indicate cue type and reason): second gown backwards (like donning button up shirt or jacket) Lower Body Dressing: Minimal assistance;Sit to/from stand Lower Body Dressing Details (indicate cue type and reason): Able to reach mid-shin for dressing. Pt stated that wife will be there to assist to get clothing items over feet Toilet Transfer: Min guard;Ambulation;BSC Toilet Transfer Details (indicate cue type and reason): BSC over toilet for BUE support for power up Toileting- Clothing Manipulation and Hygiene: Supervision/safety;Sit to/from stand   Tub/ Shower Transfer: Walk-in shower;Min guard;Ambulation;Rolling walker Tub/Shower Transfer Details (indicate cue type and reason): Simulated with ambulation into the bathroom. Educated on safety with wet surfaces, and importance of having caregiver present the first time. Also educated on 3 in 1 as shower chair Functional mobility during ADLs: Min guard;Rolling walker General ADL Comments: motivated and willing to do things for himself, but wife able to provide support for  what he cannot do right now     Vision Vision Assessment?: No apparent visual deficits   Perception     Praxis      Pertinent Vitals/Pain Pain Assessment: 0-10 Pain Score: 4  Pain Location: L knee Pain  Descriptors / Indicators: Aching;Sore Pain Intervention(s): Monitored during session;Premedicated before session;Ice applied;Repositioned     Hand Dominance Right   Extremity/Trunk Assessment Upper Extremity Assessment Upper Extremity Assessment: Overall WFL for tasks assessed   Lower Extremity Assessment Lower Extremity Assessment: LLE deficits/detail LLE Deficits / Details: LLE with decreased strength and ROM as expected post-op   Cervical / Trunk Assessment Cervical / Trunk Assessment: Other exceptions Cervical / Trunk Exceptions: h/o cervical spine surgery   Communication Communication Communication: HOH   Cognition Arousal/Alertness: Awake/alert Behavior During Therapy: WFL for tasks assessed/performed Overall Cognitive Status: Within Functional Limits for tasks assessed                     General Comments       Exercises       Shoulder Instructions      Home Living Family/patient expects to be discharged to:: Private residence Living Arrangements: Spouse/significant other Available Help at Discharge: Family;Available 24 hours/day Type of Home: House Home Access: Stairs to enter [employer] of Steps: 2 Entrance Stairs-Rails: Right Home Layout: One level     Bathroom Shower/Tub: Occupational psychologist: Standard Bathroom Accessibility: Yes How Accessible: Accessible via walker Home Equipment: [place] - 2 wheels;Shower seat - built in;Other (comment) (toilet seat raise)   Additional Comments: daughter works at [employer]      Prior Functioning/Environment Level of Independence: Independent        Comments: pt is retired and helps with grandchildren        OT Problem List: Decreased strength;Decreased range of motion;Pain   OT Treatment/Interventions:      OT Goals(Current goals can be found in the care plan section) Acute Rehab OT Goals Patient Stated Goal: To go home today OT Goal Formulation: With patient Time For Goal  Achievement: 05/01/16 Potential to Achieve Goals: Good  OT Frequency:     Barriers to D/C:            Co-evaluation              End of Session Equipment Utilized During Treatment: Gait belt;Rolling walker CPM Left Knee CPM Left Knee: Off Nurse Communication: Mobility status  Activity Tolerance: Patient tolerated treatment well Patient left: in chair;with call bell/phone within reach   Time: 0923-1004 OT Time Calculation (min): 41 min Charges:  OT General Charges $OT Visit: 1 Procedure OT Evaluation $OT Eval Low Complexity: 1 Procedure OT Treatments $Self Care/Home Management : 23-37 mins G-Codes:    Merri Ray Warrick Llera 08-May-2016, 2:29 PM Hulda Humphrey OTR/L [phone]

## 2016-04-24 NOTE — Anesthesia Postprocedure Evaluation (Signed)
Anesthesia Post Note  Patient: Craig Mccann  Procedure(s) Performed: Procedure(s) (LRB): TOTAL KNEE ARTHROPLASTY (Left)  Patient location during evaluation: PACU Anesthesia Type: General Level of consciousness: awake and alert Pain management: pain level controlled Vital Signs Assessment: post-procedure vital signs reviewed and stable Respiratory status: spontaneous breathing, nonlabored ventilation and respiratory function stable Cardiovascular status: blood pressure returned to baseline and stable Postop Assessment: no signs of nausea or vomiting Anesthetic complications: no    Last Vitals:  Vitals:   04/24/16 0016 04/24/16 1348  BP: 113/63 109/60  Pulse: 76 75  Resp: 17 18  Temp: 36.7 C 36.7 C    Last Pain:  Vitals:   04/24/16 1348  TempSrc: Oral  PainSc:                  Sigifredo Pignato A

## 2016-04-24 NOTE — Care Management Note (Addendum)
Case Management Note  Patient Details  Name: Craig Mccann MRN: [id number] Date of Birth: 1951/01/15  Subjective/Objective:   65 yr old gentleman s/p left total knee arthroplasty.                  Action/Plan: Case manager spoke with patient and his wife concerning [place] and DME needs. Choice for Home Health agency was offered. Referral was called to [place], [facility] Liaison. Patient has rolling walker and 3in1. CPM has been delivered to his home. Will have family support at discharge.    Expected Discharge Date:   04/24/16               Expected Discharge Plan:  [place]  In-House Referral:     Discharge planning Services     Post Acute Care Choice:  Durable Medical Equipment, Home Health Choice offered to:  Patient, Spouse  DME Arranged:  [facility] DME Agency:  [employer]  HH Arranged:  PT [place]:  [facility] Status of Service:  Completed, signed off  If discussed at [place] of Stay Meetings, dates discussed:    Additional Comments: 04/24/16 3:15pmPatient had been preoperatively setup with [facility], Patient and wife did not remember this. Orders have been faxed to [place]  Ninfa Meeker, RN 04/24/2016, 12:38 PM

## 2016-04-24 NOTE — Progress Notes (Signed)
Orthopedic Tech Progress Note Patient Details:  Craig Mccann July 27, 1951 [id number]  Patient ID: [name], male   DOB: [birth year], 65 y.o.   MRN: [id number] Applied cpm 0-60  Karolee Stamps 04/24/2016, 5:49 AM

## 2016-04-24 NOTE — Progress Notes (Signed)
Reviewed discharge education with pt. And wife.  Questions were answered and pt was supplied with new prescriptions and instructions on how and when to take new medications.   Education on how to care for dressing were reviewed.   IV was removed. Pt belongings were gathered in bag by wife.  Pt denies any discomfort and is ready for discharge.    Paulla Fore, RN

## 2016-04-24 NOTE — Op Note (Signed)
NAME:  CLAUDIO, GOMBERG NO.:  [id number]  MEDICAL RECORD NO.:  [id number]  LOCATION:  5N02C                        FACILITY:  [facility]  PHYSICIAN:  Ninetta Lights, M.D. DATE OF BIRTH:  May 30, 1951  DATE OF PROCEDURE:  04/23/2016 DATE OF DISCHARGE:                              OPERATIVE REPORT   PREOPERATIVE DIAGNOSES: 1. Left knee end-stage arthritis, primary localized. 2. ACL deficiency.  POSTOPERATIVE DIAGNOSES: 1. Left knee end-stage arthritis, primary localized. 2. ACL deficiency.  PROCEDURE:  Left knee modified minimally invasive total knee replacement with Stryker Triathlon prosthesis.  A cemented pegged posterior stabilized #6 femoral component.  Cemented #6 tibial component, an 11 mm PS insert.  Cemented resurfacing 38 mm patellar component.  SURGEON:  Ninetta Lights, M.D.  ASSISTANT:  [employer]. Mercie Eon., present throughout the entire case and necessary for timely completion of procedure.  ANESTHESIA:  General.  BLOOD LOSS:  Minimal.  SPECIMENS:  None.  CULTURES:  None.  COMPLICATIONS:  None.  DRESSINGS:  Sterile compressive, knee immobilizer.  TOURNIQUET TIME:  1 hour.  DESCRIPTION OF PROCEDURE:  The patient was brought to the operating room, and after adequate anesthesia had been obtained, tourniquet applied.  Prepped and draped in the usual sterile fashion. Exsanguinated with elevation of [place].  Tourniquet inflated to 350 mmHg.  Straight incision above the patella and down to the tibial tubercle.  Medial arthrotomy, vastus splitting.  Intramedullary guide, distal femur flexible rod.  8 mm resection, 5 degrees of valgus.  Using epicondylar axis, femur was sized, cut, and fitted for a pegged #6 posterior stabilized component.  Extramedullary guide, proximal tibial resection.  Sized to #6 component.  Rotation set with trials.  Posterior 10 mm patella removed, drill sized and fitted for a 38 mm component. Once the tibia had been  reamed after setting rotation, the knee was thoroughly irrigated.  Cement prepared with all components firmly seated.  An 11 mm insert on the tibia.  At completion, I was very pleased with balancing, stability, flexion and extension, full motion, good patellar tracking.  Soft tissue was injected with Exparel after the knee was irrigated again.  Arthrotomy closed with #1 Vicryl and the subcutaneous with subcuticular closure.  Sterile compressive dressing applied.  Tourniquet deflated, removed.  Knee immobilizer applied. Anesthesia reversed.  Brought to the recovery room.  Tolerated the surgery well.  No complications.     Ninetta Lights, M.D.     DFM/MEDQ  D:  04/23/2016  T:  04/24/2016  Job:  [id number]

## 2016-04-24 NOTE — Care Management (Signed)
Care manager faxed all discharge information to [facility]. Patient had been preoperatively setup with them. [facility] Liaison was notified of change in [place].

## 2016-04-24 NOTE — Progress Notes (Signed)
Physical Therapy Treatment Patient Details Name: Craig Mccann MRN: [id number] DOB: [birth year] Today's Date: 04/24/2016    History of Present Illness 65 y.o. male admitted to [facility] on 04/23/16 for elective L TKA.  PMhx significant for bil knee surgeries, HOH, COPD, chronic renal insufficiency, bil thumb surgery, and neck surgery.    PT Comments    Pt performed increased mobility, reviewed HEP and performed stair training in prep for d/c.  Handout issued on stair training.  RN informed that patient is ready for d/c home.    Follow Up Recommendations  Home health PT;Supervision for mobility/OOB     Equipment Recommendations  None recommended by PT    Recommendations for Other Services       Precautions / Restrictions Precautions Precautions: Knee Precaution Booklet Issued: Yes (comment) Precaution Comments: knee handout given, precautions reviewed with wife Required Braces or Orthoses: Knee Immobilizer - Left Knee Immobilizer - Left: Other (comment) (no order educated on use at night to avoid knee flexion when sleeping on side.  ) Restrictions Weight Bearing Restrictions: Yes LLE Weight Bearing: Weight bearing as tolerated    Mobility  Bed Mobility                  Transfers   Equipment used: Rolling walker (2 wheeled) Transfers: Sit to/from Stand Sit to Stand: Modified independent (Device/Increase time)         General transfer comment: Pt standing in halls on arrival.  Performed stand to sit with good technique.    Ambulation/Gait Ambulation/Gait assistance: Supervision Ambulation Distance (Feet): 300 Feet Assistive device: Rolling walker (2 wheeled) Gait Pattern/deviations: Step-through pattern;Trunk flexed;Antalgic   Gait velocity interpretation: Below normal speed for age/gender General Gait Details: Cues for L knee extension and L heel strike.     Stairs Stairs: Yes Stairs assistance: Min assist Stair Management: No rails;With walker Number of  Stairs: 4 General stair comments: Cues for sequencing and RW placement, pt performed additional trial to teach back method to therapist.    Wheelchair Mobility    Modified Rankin (Stroke Patients Only)       Balance Overall balance assessment: Needs assistance   Sitting balance-Leahy Scale: Fair       Standing balance-Leahy Scale: Poor                      Cognition Arousal/Alertness: Awake/alert Behavior During Therapy: WFL for tasks assessed/performed Overall Cognitive Status: Within Functional Limits for tasks assessed                      Exercises Total Joint Exercises Ankle Circles/Pumps: AROM;Both;20 reps;Supine Quad Sets: AROM;Left;10 reps;Supine Towel Squeeze: AROM;Both;10 reps;Supine Short Arc Quad: AROM;Left;10 reps;Supine Heel Slides: AROM;Left;10 reps;Supine Hip ABduction/ADduction: AROM;Left;10 reps;Supine Straight Leg Raises: AROM;Left;10 reps;Supine Long Arc Quad: AROM;Left;10 reps;Seated Knee Flexion: AROM;AAROM;Left;20 reps;Seated Goniometric ROM: 88 degrees L knee flexion.      General Comments        Pertinent Vitals/Pain Pain Assessment: 0-10 Pain Score: 5  Pain Location: L knee Pain Descriptors / Indicators: Aching;Burning Pain Intervention(s): Limited activity within patient's tolerance    Home Living Family/patient expects to be discharged to:: Private residence Living Arrangements: Spouse/significant other Available Help at Discharge: Family;Available 24 hours/day Type of Home: House Home Access: Stairs to enter Entrance Stairs-Rails: Right Home Layout: One level Home Equipment: [place] - 2 wheels;Shower seat - built in;Other (comment) (toilet seat raise) Additional Comments: daughter works at [employer]  Level of Independence: Independent      Comments: pt is retired and helps with grandchildren   PT Goals (current goals can now be found in the care plan section) Acute Rehab PT Goals Patient Stated  Goal: To go home today Potential to Achieve Goals: Good Progress towards PT goals: Progressing toward goals    Frequency    7X/week      PT Plan Current plan remains appropriate    Co-evaluation             End of Session Equipment Utilized During Treatment: Gait belt Activity Tolerance: Patient tolerated treatment well Patient left: in chair;with call bell/phone within reach     Time: 1051-1119 PT Time Calculation (min) (ACUTE ONLY): 28 min  Charges:  $Gait Training: 8-22 mins $Therapeutic Exercise: 8-22 mins                    G Codes:      Cristela Blue 04-27-16, 11:26 AM Governor Rooks, PTA pager [phone]

## 2016-04-25 DIAGNOSIS — Z96652 Presence of left artificial knee joint: Secondary | ICD-10-CM | POA: Diagnosis not present

## 2016-04-25 DIAGNOSIS — R262 Difficulty in walking, not elsewhere classified: Secondary | ICD-10-CM | POA: Diagnosis not present

## 2016-04-25 DIAGNOSIS — Z471 Aftercare following joint replacement surgery: Secondary | ICD-10-CM | POA: Diagnosis not present

## 2016-04-28 DIAGNOSIS — Z96652 Presence of left artificial knee joint: Secondary | ICD-10-CM | POA: Diagnosis not present

## 2016-04-28 DIAGNOSIS — Z471 Aftercare following joint replacement surgery: Secondary | ICD-10-CM | POA: Diagnosis not present

## 2016-04-28 DIAGNOSIS — R262 Difficulty in walking, not elsewhere classified: Secondary | ICD-10-CM | POA: Diagnosis not present

## 2016-04-30 DIAGNOSIS — R262 Difficulty in walking, not elsewhere classified: Secondary | ICD-10-CM | POA: Diagnosis not present

## 2016-04-30 DIAGNOSIS — Z471 Aftercare following joint replacement surgery: Secondary | ICD-10-CM | POA: Diagnosis not present

## 2016-04-30 DIAGNOSIS — Z96652 Presence of left artificial knee joint: Secondary | ICD-10-CM | POA: Diagnosis not present

## 2016-05-02 DIAGNOSIS — M1712 Unilateral primary osteoarthritis, left knee: Secondary | ICD-10-CM | POA: Diagnosis not present

## 2016-05-06 DIAGNOSIS — M25662 Stiffness of left knee, not elsewhere classified: Secondary | ICD-10-CM | POA: Diagnosis not present

## 2016-05-06 DIAGNOSIS — M25562 Pain in left knee: Secondary | ICD-10-CM | POA: Diagnosis not present

## 2016-05-06 DIAGNOSIS — M1712 Unilateral primary osteoarthritis, left knee: Secondary | ICD-10-CM | POA: Diagnosis not present

## 2016-05-06 DIAGNOSIS — M25462 Effusion, left knee: Secondary | ICD-10-CM | POA: Diagnosis not present

## 2016-05-08 ENCOUNTER — Ambulatory Visit: Payer: Medicare Other | Admitting: Family Medicine

## 2016-05-08 DIAGNOSIS — M25462 Effusion, left knee: Secondary | ICD-10-CM | POA: Diagnosis not present

## 2016-05-08 DIAGNOSIS — M25562 Pain in left knee: Secondary | ICD-10-CM | POA: Diagnosis not present

## 2016-05-08 DIAGNOSIS — M25662 Stiffness of left knee, not elsewhere classified: Secondary | ICD-10-CM | POA: Diagnosis not present

## 2016-05-13 DIAGNOSIS — M25562 Pain in left knee: Secondary | ICD-10-CM | POA: Diagnosis not present

## 2016-05-13 DIAGNOSIS — M25462 Effusion, left knee: Secondary | ICD-10-CM | POA: Diagnosis not present

## 2016-05-13 DIAGNOSIS — M25662 Stiffness of left knee, not elsewhere classified: Secondary | ICD-10-CM | POA: Diagnosis not present

## 2016-05-15 DIAGNOSIS — M25662 Stiffness of left knee, not elsewhere classified: Secondary | ICD-10-CM | POA: Diagnosis not present

## 2016-05-15 DIAGNOSIS — M25462 Effusion, left knee: Secondary | ICD-10-CM | POA: Diagnosis not present

## 2016-05-15 DIAGNOSIS — M25562 Pain in left knee: Secondary | ICD-10-CM | POA: Diagnosis not present

## 2016-05-20 DIAGNOSIS — M25562 Pain in left knee: Secondary | ICD-10-CM | POA: Diagnosis not present

## 2016-05-20 DIAGNOSIS — M25662 Stiffness of left knee, not elsewhere classified: Secondary | ICD-10-CM | POA: Diagnosis not present

## 2016-05-20 DIAGNOSIS — M25462 Effusion, left knee: Secondary | ICD-10-CM | POA: Diagnosis not present

## 2016-05-27 DIAGNOSIS — M25662 Stiffness of left knee, not elsewhere classified: Secondary | ICD-10-CM | POA: Diagnosis not present

## 2016-05-27 DIAGNOSIS — M25562 Pain in left knee: Secondary | ICD-10-CM | POA: Diagnosis not present

## 2016-05-27 DIAGNOSIS — M25462 Effusion, left knee: Secondary | ICD-10-CM | POA: Diagnosis not present

## 2016-05-30 DIAGNOSIS — M25562 Pain in left knee: Secondary | ICD-10-CM | POA: Diagnosis not present

## 2016-05-30 DIAGNOSIS — M25662 Stiffness of left knee, not elsewhere classified: Secondary | ICD-10-CM | POA: Diagnosis not present

## 2016-05-30 DIAGNOSIS — M25462 Effusion, left knee: Secondary | ICD-10-CM | POA: Diagnosis not present

## 2016-06-03 DIAGNOSIS — M1712 Unilateral primary osteoarthritis, left knee: Secondary | ICD-10-CM | POA: Diagnosis not present

## 2016-06-04 DIAGNOSIS — M25462 Effusion, left knee: Secondary | ICD-10-CM | POA: Diagnosis not present

## 2016-06-04 DIAGNOSIS — M25662 Stiffness of left knee, not elsewhere classified: Secondary | ICD-10-CM | POA: Diagnosis not present

## 2016-06-04 DIAGNOSIS — M25562 Pain in left knee: Secondary | ICD-10-CM | POA: Diagnosis not present

## 2016-06-06 DIAGNOSIS — M25662 Stiffness of left knee, not elsewhere classified: Secondary | ICD-10-CM | POA: Diagnosis not present

## 2016-06-06 DIAGNOSIS — M25462 Effusion, left knee: Secondary | ICD-10-CM | POA: Diagnosis not present

## 2016-06-06 DIAGNOSIS — M25562 Pain in left knee: Secondary | ICD-10-CM | POA: Diagnosis not present

## 2016-06-10 DIAGNOSIS — M25662 Stiffness of left knee, not elsewhere classified: Secondary | ICD-10-CM | POA: Diagnosis not present

## 2016-06-10 DIAGNOSIS — M25562 Pain in left knee: Secondary | ICD-10-CM | POA: Diagnosis not present

## 2016-06-10 DIAGNOSIS — M25462 Effusion, left knee: Secondary | ICD-10-CM | POA: Diagnosis not present

## 2016-07-01 DIAGNOSIS — M1712 Unilateral primary osteoarthritis, left knee: Secondary | ICD-10-CM | POA: Diagnosis not present

## 2016-07-08 DIAGNOSIS — D485 Neoplasm of uncertain behavior of skin: Secondary | ICD-10-CM | POA: Diagnosis not present

## 2016-07-08 DIAGNOSIS — L570 Actinic keratosis: Secondary | ICD-10-CM | POA: Diagnosis not present

## 2016-07-08 DIAGNOSIS — Z85820 Personal history of malignant melanoma of skin: Secondary | ICD-10-CM | POA: Diagnosis not present

## 2016-07-08 DIAGNOSIS — D225 Melanocytic nevi of trunk: Secondary | ICD-10-CM | POA: Diagnosis not present

## 2016-07-08 DIAGNOSIS — D2261 Melanocytic nevi of right upper limb, including shoulder: Secondary | ICD-10-CM | POA: Diagnosis not present

## 2016-07-11 ENCOUNTER — Other Ambulatory Visit: Payer: Self-pay

## 2016-07-14 ENCOUNTER — Ambulatory Visit (INDEPENDENT_AMBULATORY_CARE_PROVIDER_SITE_OTHER): Payer: Medicare Other

## 2016-07-14 ENCOUNTER — Other Ambulatory Visit: Payer: Self-pay | Admitting: Family Medicine

## 2016-07-14 ENCOUNTER — Telehealth: Payer: Self-pay | Admitting: Family Medicine

## 2016-07-14 VITALS — BP 126/76 | HR 83 | Ht 73.5 in | Wt 267.4 lb

## 2016-07-14 DIAGNOSIS — Z0000 Encounter for general adult medical examination without abnormal findings: Secondary | ICD-10-CM | POA: Diagnosis not present

## 2016-07-14 DIAGNOSIS — E785 Hyperlipidemia, unspecified: Secondary | ICD-10-CM

## 2016-07-14 DIAGNOSIS — R0609 Other forms of dyspnea: Principal | ICD-10-CM

## 2016-07-14 DIAGNOSIS — Z136 Encounter for screening for cardiovascular disorders: Secondary | ICD-10-CM | POA: Diagnosis not present

## 2016-07-14 HISTORY — DX: Hyperlipidemia, unspecified: E78.5

## 2016-07-14 LAB — LDL CHOLESTEROL, DIRECT: Direct LDL: 132 mg/dL

## 2016-07-14 LAB — LIPID PANEL
CHOL/HDL RATIO: 5
CHOLESTEROL: 203 mg/dL — AB (ref 0–200)
HDL: 37.6 mg/dL — ABNORMAL LOW (ref >39.00)
NonHDL: 165.44
TRIGLYCERIDES: 230 mg/dL — AB (ref 0.0–149.0)
VLDL: 46 mg/dL — ABNORMAL HIGH (ref 0.0–40.0)

## 2016-07-14 NOTE — Progress Notes (Signed)
AWV reviewed and agree.  Signed:  Crissie Sickles, MD           07/14/2016

## 2016-07-14 NOTE — Telephone Encounter (Signed)
Pulmonary referral ordered as per pt request.

## 2016-07-14 NOTE — Progress Notes (Signed)
Pre visit review using our clinic review tool, if applicable. No additional management support is needed unless otherwise documented below in the visit note. 

## 2016-07-14 NOTE — Telephone Encounter (Signed)
Left detailed message on cell vm, okay per DPR.  

## 2016-07-14 NOTE — Progress Notes (Signed)
Subjective:   Craig Mccann is a 65 y.o. male who presents for an Initial Medicare Annual Wellness Visit.  The Patient was informed that the wellness visit is to identify future health risk and educate and initiate measures that can reduce risk for increased disease through the lifespan.   Describes health as fair, good or great? "fair"  Enjoys yard work and staying active. Knee pain and breathing difficulty currently limiting patient.   Review of Systems  No ROS.  Medicare Wellness Visit.  Cardiac Risk Factors include: advanced age (>58men, >45 women);male gender;obesity (BMI >30kg/m2);smoking/ tobacco exposure   Sleep patterns: Sleeps 5 hours.  Home Safety/Smoke Alarms:  Smoke detectors in place.  Living environment; residence and Firearm Safety:  Lives with wife in single story, some steps. 2 grandkids visit often. Firearms locked away.  Seat Belt Safety/Bike Helmet: Wears seat belt.    Counseling:   Eye Exam-Last exam >2 years. Goes for issues/new Rx only.  Dental-Last exam July 2017, every year   Male:   [facility]-Colonoscopy 12/23/2010, benign polyps. Recall 10 years.      PSA-Followed by Urology.     Objective:    Today's Vitals   07/14/16 1017  BP: 126/76  Pulse: 83  SpO2: 95%  Weight: 267 lb 6.4 oz (121.3 kg)  Height: 6' 1.5" (1.867 m)  PainSc: 2    Body mass index is 34.8 kg/m.  Current Medications (verified) Outpatient Encounter Prescriptions as of 07/14/2016  Medication Sig  . albuterol (PROAIR HFA) 108 (90 BASE) MCG/ACT inhaler Inhale 2 puffs into the lungs every 6 (six) hours as needed for wheezing or shortness of breath.  [name] aspirin EC 325 MG EC tablet Take 1 tablet (325 mg total) by mouth daily with breakfast.  . tiotropium (SPIRIVA HANDIHALER) 18 MCG inhalation capsule Place 1 capsule (18 mcg total) into inhaler and inhale daily.  . celecoxib (CELEBREX) 200 MG capsule Take 1 capsule (200 mg total) by mouth every 12 (twelve) hours. (Patient not taking:  Reported on 07/14/2016)  . docusate sodium (COLACE) 100 MG capsule Take 1 capsule (100 mg total) by mouth 2 (two) times daily. (Patient not taking: Reported on 07/14/2016)  . methocarbamol (ROBAXIN) 500 MG tablet Take 1 tablet (500 mg total) by mouth every 6 (six) hours as needed for muscle spasms. (Patient not taking: Reported on 07/14/2016)  . ondansetron (ZOFRAN) 4 MG tablet Take 1 tablet (4 mg total) by mouth every 6 (six) hours as needed for nausea. (Patient not taking: Reported on 07/14/2016)  . oxyCODONE-acetaminophen (PERCOCET/ROXICET) 5-325 MG tablet Take 1-2 tablets by mouth every 4 (four) hours as needed for moderate pain. (Patient not taking: Reported on 07/14/2016)   No facility-administered encounter medications on file as of 07/14/2016.     Allergies (verified) Codeine and Valium [diazepam]   History: Past Medical History:  Diagnosis Date  . Acute prostatitis 01/2015  . Basal cell carcinoma 2017   Multiple: back, left shin, left arm  . CAP (community acquired pneumonia) 06/26/2015  . Chronic renal insufficiency, stage II (mild)    stage II/II: CrCl 60s.  . Complication of anesthesia   . COPD (chronic obstructive pulmonary disease) ([place]) 05/07/15   Long time smoker + COPD changes noted on lung ca screening CT  . Diverticulitis   . Family history of colon cancer in mother   . Hearing impairment    hearing aids  . History of adenomatous polyp of colon 12/24/10  . Kidney stones    bilat  nonobstrucing renal calculi 2016, + left benign renal cysts  . Melanoma ([place])    R posterolateral neck and R nasal ala.  Most recent was left mid back 01/2016 (Dr. Tarri Glenn).  [name] Spinal headache   . Tobacco dependence    CT chest for lung ca screning 04/2015 showed benign findings; 1 yr repeat recommended   Past Surgical History:  Procedure Laterality Date  . CHOLECYSTECTOMY  1987  . COLONOSCOPY  12/24/10; 2014   Multiple TCSs: pt on 5 yr recall, next due 2019.  . INGUINAL HERNIA REPAIR   1984  . KNEE SURGERY Left 2015   cartilage repair  . KNEE SURGERY Right 1997   "          "  . LITHOTRIPSY    . NECK SURGERY  1989  . Thumb surgery Bilateral 2005   and 2006  . TOTAL KNEE ARTHROPLASTY Left 04/23/2016   Procedure: TOTAL KNEE ARTHROPLASTY;  Surgeon: Ninetta Lights, MD;  Location: [facility];  Service: Orthopedics;  Laterality: Left;   Family History  Problem Relation Age of Onset  . Colon cancer Mother   . Lung cancer Father   . Brain cancer Brother    Social History   Occupational History  . Not on file.   Social History Main Topics  . Smoking status: Current Every Day Smoker    Packs/day: 1.00    Years: 40.00    Types: Cigarettes  . Smokeless tobacco: Never Used  . Alcohol use No  . Drug use: No  . Sexual activity: Not on file   Tobacco Counseling Ready to quit: Not Answered Counseling given: Not Answered  Patient working on cessation. Currently 1/4 pack/day.   Activities of Daily Living In your present state of health, do you have any difficulty performing the following activities: 07/14/2016 04/11/2016  Hearing? N N  Vision? N N  Difficulty concentrating or making decisions? N N  Walking or climbing stairs? N Y  Dressing or bathing? N N  Doing errands, shopping? N N  Preparing Food and eating ? N -  Using the Toilet? N -  In the past six months, have you accidently leaked urine? N -  Do you have problems with loss of bowel control? N -  Managing your Medications? N -  Managing your Finances? N -  Housekeeping or managing your Housekeeping? N -  Some recent data might be hidden    Immunizations and Health Maintenance  There is no immunization history on file for this patient. There are no preventive care reminders to display for this patient.  Patient Care Team: Tammi Sou, MD as PCP - General (Family Medicine) Kathie Rhodes, MD as Consulting Physician (Urology) Dene Gentry, MD as Consulting Physician (Sports Medicine) Sandford Craze, MD as Consulting Physician (Dermatology) Ninetta Lights, MD as Consulting Physician (Orthopedic Surgery)  Indicate any recent [place] you may have received from other than [facility] providers in the past year (date may be approximate).    Assessment:   This is a routine wellness examination for Jacorion. Physical assessment deferred to PCP.    Hearing/Vision screen Hearing Screening Comments: Has hearing aids, does not wear very often.  Vision Screening Comments: Wears corrective lenses to 20/20.  Dietary issues and exercise activities discussed: Exercise limited by: respiratory conditions(s);orthopedic condition(s)   Diet (meal preparation, eat out, water intake, caffeinated beverages, dairy products, fruits and vegetables): Eats at home.  Drinks [place]. Dew, [place], and water.  Breakfast: Biscuit, shake, coffee (1 cup) Lunch: sandwich Dinner: chicken (baked/grilled), vegetables.   Encouraged to decrease amount of soda and increase water.  Discussed heart healthy diet.  Encouraged patient to remain as active as possible, increase activity as tolerated. (recent knee surgery and DOE).     Goals      Patient Stated   . <enter goal here> (pt-stated)          "breathe"      Depression Screen PHQ 2/9 Scores 07/14/2016 05/03/2015 02/14/2015  PHQ - 2 Score 0 0 0    Fall Risk Fall Risk  07/14/2016 05/03/2015 02/14/2015  Falls in the past year? Yes Yes Yes  Number falls in past yr: 2 or more 2 or more 2 or more  Injury with Fall? No No No  Risk Factor Category  - High Fall Risk -  Risk for fall due to : Impaired balance/gait - -  Follow up Falls prevention discussed Falls prevention discussed -    Cognitive Function:       Ad8 score reviewed for issues:  Issues making decisions: no  Less interest in hobbies / activities:no  Repeats questions, stories (family complaining):no  Trouble using ordinary gadgets (microwave, computer, phone):no  Forgets the  month or year: no  Mismanaging finances: no  Remembering appts:no  Daily problems with thinking and/or memory:no Ad8 score is=0     Screening Tests Health Maintenance  Topic Date Due  . INFLUENZA VACCINE  11/01/2016 (Originally 03/04/2016)  . PNA vac Low Risk Adult (1 of 2 - PCV13) 11/01/2016 (Originally 09/16/2014)  . ZOSTAVAX  07/14/2017 (Originally 09/16/2009)  . TETANUS/TDAP  07/14/2017 (Originally [birth year]1968)  . Hepatitis C Screening  07/14/2017 (Originally [birth year])  . COLONOSCOPY  12/22/2020        Plan:     Eat heart healthy diet (full of fruits, vegetables, whole grains, lean protein, water--limit salt, fat, and sugar intake) and increase physical activity as tolerated.  Continue doing brain stimulating activities (puzzles, reading, adult coloring books, staying active) to keep memory sharp.   Bring a copy of your advance directives to your next office visit.  **FYI-Patient continues to have dyspnea on exertion, requesting consult to pulmonologist. Smoking cessation in progress (down to 1/4 pack per day from 2 packs/day).   Appt with PCP in January 2018.    During the course of the visit Derrol was educated and counseled about the following appropriate screening and preventive services:   Vaccines to include Pneumoccal, Influenza, Hepatitis B, Td, Zostavax, HCV  Colorectal cancer screening  Cardiovascular disease screening  Diabetes screening  Glaucoma screening  Nutrition counseling  Prostate cancer screening  Smoking cessation counseling  Patient Instructions (the written plan) were given to the patient.   Gerilyn Nestle, RN   07/14/2016

## 2016-07-14 NOTE — Telephone Encounter (Signed)
-----   Message from Gerilyn Nestle, RN sent at 07/14/2016 11:25 AM EST ----- Regarding: Pulmonary Referral Patient complains of DOE, would like referral to pulmonologist if possible. Has appt with you in January if he needs to be seen prior to referral.

## 2016-07-14 NOTE — Patient Instructions (Addendum)
Eat heart healthy diet (full of fruits, vegetables, whole grains, lean protein, water--limit salt, fat, and sugar intake) and increase physical activity as tolerated.  Continue doing brain stimulating activities (puzzles, reading, adult coloring books, staying active) to keep memory sharp.   Bring a copy of your advance directives to your next office visit.   Fall Prevention in the Home Introduction Falls can cause injuries. They can happen to people of all ages. There are many things you can do to make your home safe and to help prevent falls. What can I do on the outside of my home?  Regularly fix the edges of walkways and driveways and fix any cracks.  Remove anything that might make you trip as you walk through a door, such as a raised step or threshold.  Trim any bushes or trees on the path to your home.  Use bright outdoor lighting.  Clear any walking paths of anything that might make someone trip, such as rocks or tools.  Regularly check to see if handrails are loose or broken. Make sure that both sides of any steps have handrails.  Any raised decks and porches should have guardrails on the edges.  Have any leaves, snow, or ice cleared regularly.  Use sand or salt on walking paths during winter.  Clean up any spills in your garage right away. This includes oil or grease spills. What can I do in the bathroom?  Use night lights.  Install grab bars by the toilet and in the tub and shower. Do not use towel bars as grab bars.  Use non-skid mats or decals in the tub or shower.  If you need to sit down in the shower, use a plastic, non-slip stool.  Keep the floor dry. Clean up any water that spills on the floor as soon as it happens.  Remove soap buildup in the tub or shower regularly.  Attach bath mats securely with double-sided non-slip rug tape.  Do not have throw rugs and other things on the floor that can make you trip. What can I do in the bedroom?  Use night  lights.  Make sure that you have a light by your bed that is easy to reach.  Do not use any sheets or blankets that are too big for your bed. They should not hang down onto the floor.  Have a firm chair that has side arms. You can use this for support while you get dressed.  Do not have throw rugs and other things on the floor that can make you trip. What can I do in the kitchen?  Clean up any spills right away.  Avoid walking on wet floors.  Keep items that you use a lot in easy-to-reach places.  If you need to reach something above you, use a strong step stool that has a grab bar.  Keep electrical cords out of the way.  Do not use floor polish or wax that makes floors slippery. If you must use wax, use non-skid floor wax.  Do not have throw rugs and other things on the floor that can make you trip. What can I do with my stairs?  Do not leave any items on the stairs.  Make sure that there are handrails on both sides of the stairs and use them. Fix handrails that are broken or loose. Make sure that handrails are as long as the stairways.  Check any carpeting to make sure that it is firmly attached to the stairs. Fix   any carpet that is loose or worn.  Avoid having throw rugs at the top or bottom of the stairs. If you do have throw rugs, attach them to the floor with carpet tape.  Make sure that you have a light switch at the top of the stairs and the bottom of the stairs. If you do not have them, ask someone to add them for you. What else can I do to help prevent falls?  Wear shoes that:  Do not have high heels.  Have rubber bottoms.  Are comfortable and fit you well.  Are closed at the toe. Do not wear sandals.  If you use a stepladder:  Make sure that it is fully opened. Do not climb a closed stepladder.  Make sure that both sides of the stepladder are locked into place.  Ask someone to hold it for you, if possible.  Clearly mark and make sure that you can  see:  Any grab bars or handrails.  First and last steps.  Where the edge of each step is.  Use tools that help you move around (mobility aids) if they are needed. These include:  Canes.  Walkers.  Scooters.  Crutches.  Turn on the lights when you go into a dark area. Replace any light bulbs as soon as they burn out.  Set up your furniture so you have a clear path. Avoid moving your furniture around.  If any of your floors are uneven, fix them.  If there are any pets around you, be aware of where they are.  Review your medicines with your doctor. Some medicines can make you feel dizzy. This can increase your chance of falling. Ask your doctor what other things that you can do to help prevent falls. This information is not intended to replace advice given to you by your health care provider. Make sure you discuss any questions you have with your health care provider. Document Released: 05/17/2009 Document Revised: 12/27/2015 Document Reviewed: 08/25/2014  2017 Elsevier  Health Maintenance, Male A healthy lifestyle and preventative care can promote health and wellness.  Maintain regular health, dental, and eye exams.  Eat a healthy diet. Foods like vegetables, fruits, whole grains, low-fat dairy products, and lean protein foods contain the nutrients you need and are low in calories. Decrease your intake of foods high in solid fats, added sugars, and salt. Get information about a proper diet from your health care provider, if necessary.  Regular physical exercise is one of the most important things you can do for your health. Most adults should get at least 150 minutes of moderate-intensity exercise (any activity that increases your heart rate and causes you to sweat) each week. In addition, most adults need muscle-strengthening exercises on 2 or more days a week.   Maintain a healthy weight. The body mass index (BMI) is a screening tool to identify possible weight problems. It  provides an estimate of body fat based on height and weight. Your health care provider can find your BMI and can help you achieve or maintain a healthy weight. For males 20 years and older:  A BMI below 18.5 is considered underweight.  A BMI of 18.5 to 24.9 is normal.  A BMI of 25 to 29.9 is considered overweight.  A BMI of 30 and above is considered obese.  Maintain normal blood lipids and cholesterol by exercising and minimizing your intake of saturated fat. Eat a balanced diet with plenty of fruits and vegetables. Blood tests for lipids   and cholesterol should begin at age 20 and be repeated every 5 years. If your lipid or cholesterol levels are high, you are over age 50, or you are at high risk for heart disease, you may need your cholesterol levels checked more frequently.Ongoing high lipid and cholesterol levels should be treated with medicines if diet and exercise are not working.  If you smoke, find out from your health care provider how to quit. If you do not use tobacco, do not start.  Lung cancer screening is recommended for adults aged 55-80 years who are at high risk for developing lung cancer because of a history of smoking. A yearly low-dose CT scan of the lungs is recommended for people who have at least a 30-pack-year history of smoking and are current smokers or have quit within the past 15 years. A pack year of smoking is smoking an average of 1 pack of cigarettes a day for 1 year (for example, a 30-pack-year history of smoking could mean smoking 1 pack a day for 30 years or 2 packs a day for 15 years). Yearly screening should continue until the smoker has stopped smoking for at least 15 years. Yearly screening should be stopped for people who develop a health problem that would prevent them from having lung cancer treatment.  If you choose to drink alcohol, do not have more than 2 drinks per day. One drink is considered to be 12 oz (360 mL) of beer, 5 oz (150 mL) of wine, or 1.5  oz (45 mL) of liquor.  Avoid the use of street drugs. Do not share needles with anyone. Ask for help if you need support or instructions about stopping the use of drugs.  High blood pressure causes heart disease and increases the risk of stroke. High blood pressure is more likely to develop in:  People who have blood pressure in the end of the normal range (100-139/85-89 mm Hg).  People who are overweight or obese.  People who are African American.  If you are 18-39 years of age, have your blood pressure checked every 3-5 years. If you are 40 years of age or older, have your blood pressure checked every year. You should have your blood pressure measured twice-once when you are at a hospital or clinic, and once when you are not at a hospital or clinic. Record the average of the two measurements. To check your blood pressure when you are not at a hospital or clinic, you can use:  An automated blood pressure machine at a pharmacy.  A home blood pressure monitor.  If you are 45-79 years old, ask your health care provider if you should take aspirin to prevent heart disease.  Diabetes screening involves taking a blood sample to check your fasting blood sugar level. This should be done once every 3 years after age 45 if you are at a normal weight and without risk factors for diabetes. Testing should be considered at a younger age or be carried out more frequently if you are overweight and have at least 1 risk factor for diabetes.  Colorectal cancer can be detected and often prevented. Most routine colorectal cancer screening begins at the age of 50 and continues through age 75. However, your health care provider may recommend screening at an earlier age if you have risk factors for colon cancer. On a yearly basis, your health care provider may provide home test kits to check for hidden blood in the stool. A small camera at the   end of a tube may be used to directly examine the colon (sigmoidoscopy or  colonoscopy) to detect the earliest forms of colorectal cancer. Talk to your health care provider about this at age 50 when routine screening begins. A direct exam of the colon should be repeated every 5-10 years through age 75, unless early forms of precancerous polyps or small growths are found.  People who are at an increased risk for hepatitis B should be screened for this virus. You are considered at high risk for hepatitis B if:  You were born in a country where hepatitis B occurs often. Talk with your health care provider about which countries are considered high risk.  Your parents were born in a high-risk country and you have not received a shot to protect against hepatitis B (hepatitis B vaccine).  You have HIV or AIDS.  You use needles to inject street drugs.  You live with, or have sex with, someone who has hepatitis B.  You are a man who has sex with other men (MSM).  You get hemodialysis treatment.  You take certain medicines for conditions like cancer, organ transplantation, and autoimmune conditions.  Hepatitis C blood testing is recommended for all people born from 1945 through 1965 and any individual with known risk factors for hepatitis C.  Healthy men should no longer receive prostate-specific antigen (PSA) blood tests as part of routine cancer screening. Talk to your health care provider about prostate cancer screening.  Testicular cancer screening is not recommended for adolescents or adult males who have no symptoms. Screening includes self-exam, a health care provider exam, and other screening tests. Consult with your health care provider about any symptoms you have or any concerns you have about testicular cancer.  Practice safe sex. Use condoms and avoid high-risk sexual practices to reduce the spread of sexually transmitted infections (STIs).  You should be screened for STIs, including gonorrhea and chlamydia if:  You are sexually active and are younger than  24 years.  You are older than 24 years, and your health care provider tells you that you are at risk for this type of infection.  Your sexual activity has changed since you were last screened, and you are at an increased risk for chlamydia or gonorrhea. Ask your health care provider if you are at risk.  If you are at risk of being infected with HIV, it is recommended that you take a prescription medicine daily to prevent HIV infection. This is called pre-exposure prophylaxis (PrEP). You are considered at risk if:  You are a man who has sex with other men (MSM).  You are a heterosexual man who is sexually active with multiple partners.  You take drugs by injection.  You are sexually active with a partner who has HIV.  Talk with your health care provider about whether you are at high risk of being infected with HIV. If you choose to begin PrEP, you should first be tested for HIV. You should then be tested every 3 months for as long as you are taking PrEP.  Use sunscreen. Apply sunscreen liberally and repeatedly throughout the day. You should seek shade when your shadow is shorter than you. Protect yourself by wearing long sleeves, pants, a wide-brimmed hat, and sunglasses year round whenever you are outdoors.  Tell your health care provider of new moles or changes in moles, especially if there is a change in shape or color. Also, tell your health care provider if a mole is larger   than the size of a pencil eraser.  A one-time screening for abdominal aortic aneurysm (AAA) and surgical repair of large AAAs by ultrasound is recommended for men aged 65-75 years who are current or former smokers.  Stay current with your vaccines (immunizations). This information is not intended to replace advice given to you by your health care provider. Make sure you discuss any questions you have with your health care provider. Document Released: 01/17/2008 Document Revised: 08/11/2014 Document Reviewed:  04/24/2015 Elsevier Interactive Patient Education  2017 [employer]  

## 2016-07-15 ENCOUNTER — Other Ambulatory Visit: Payer: Self-pay | Admitting: Family Medicine

## 2016-07-15 ENCOUNTER — Encounter: Payer: Self-pay | Admitting: Family Medicine

## 2016-07-15 MED ORDER — ATORVASTATIN CALCIUM 20 MG PO TABS: 20.0000 mg | ORAL_TABLET | Freq: Every day | ORAL | 2 refills | Status: DC

## 2016-07-17 ENCOUNTER — Other Ambulatory Visit: Payer: Medicare Other

## 2016-08-12 NOTE — Progress Notes (Signed)
Pre visit review using our clinic review tool, if applicable. No additional management support is needed unless otherwise documented below in the visit note. 

## 2016-08-13 ENCOUNTER — Other Ambulatory Visit: Payer: Self-pay | Admitting: Family Medicine

## 2016-08-13 ENCOUNTER — Encounter: Payer: Self-pay | Admitting: Family Medicine

## 2016-08-13 ENCOUNTER — Ambulatory Visit (INDEPENDENT_AMBULATORY_CARE_PROVIDER_SITE_OTHER): Payer: Medicare Other | Admitting: Family Medicine

## 2016-08-13 VITALS — BP 116/81 | HR 84 | Temp 97.8°F | Resp 16 | Ht 73.5 in | Wt 270.5 lb

## 2016-08-13 DIAGNOSIS — J438 Other emphysema: Secondary | ICD-10-CM | POA: Diagnosis not present

## 2016-08-13 DIAGNOSIS — N182 Chronic kidney disease, stage 2 (mild): Secondary | ICD-10-CM | POA: Diagnosis not present

## 2016-08-13 DIAGNOSIS — E7800 Pure hypercholesterolemia, unspecified: Secondary | ICD-10-CM

## 2016-08-13 DIAGNOSIS — F17200 Nicotine dependence, unspecified, uncomplicated: Secondary | ICD-10-CM | POA: Diagnosis not present

## 2016-08-13 DIAGNOSIS — Z87891 Personal history of nicotine dependence: Secondary | ICD-10-CM

## 2016-08-13 LAB — BASIC METABOLIC PANEL
BUN: 16 mg/dL (ref 6–23)
CHLORIDE: 105 meq/L (ref 96–112)
CO2: 32 meq/L (ref 19–32)
CREATININE: 1.26 mg/dL (ref 0.40–1.50)
Calcium: 10.2 mg/dL (ref 8.4–10.5)
GFR: 60.69 mL/min (ref >60.00)
GLUCOSE: 104 mg/dL — AB (ref 70–99)
POTASSIUM: 5.2 meq/L — AB (ref 3.5–5.1)
Sodium: 142 mEq/L (ref 135–145)

## 2016-08-13 LAB — LIPID PANEL
Cholesterol: 222 mg/dL — ABNORMAL HIGH (ref 0–200)
HDL: 41.3 mg/dL (ref >39.00)
LDL CALC: 144 mg/dL — AB (ref 0–99)
NonHDL: 180.51
Total CHOL/HDL Ratio: 5
Triglycerides: 183 mg/dL — ABNORMAL HIGH (ref 0.0–149.0)
VLDL: 36.6 mg/dL (ref 0.0–40.0)

## 2016-08-13 MED ORDER — ATORVASTATIN CALCIUM 40 MG PO TABS: 40.0000 mg | ORAL_TABLET | Freq: Every day | ORAL | 2 refills | Status: DC

## 2016-08-13 NOTE — Progress Notes (Signed)
OFFICE VISIT  08/13/2016   CC:  Chief Complaint  Patient presents with  . Follow-up    RCI, pt is fasting.    HPI:    Patient is a 66 y.o. Caucasian male who presents for f/u COPD, HLD, CRI stage II/III. Started statin 07/2016.  No side effects---has been on it x 1 mo. Breathing feeling fine, goes to the GYM and is limited by his knee, not by his breathing. Rarely requires albuterol. Tobacco: he is down to 1 pack cigs per MONTH.  Past Medical History:  Diagnosis Date  . Acute prostatitis 01/2015  . Basal cell carcinoma 2017   Multiple: back, left shin, left arm  . CAP (community acquired pneumonia) 06/26/2015  . Chronic renal insufficiency, stage II (mild)    stage II/II: CrCl 60s.  . Complication of anesthesia   . COPD (chronic obstructive pulmonary disease) ([place]) 05/07/15   Long time smoker + COPD changes noted on lung ca screening CT  . Diverticulitis   . Family history of colon cancer in mother   . Hearing impairment    hearing aids  . History of adenomatous polyp of colon 12/24/10  . Hyperlipidemia 07/14/2016   started atorv 20 07/16/16  . Kidney stones    bilat nonobstrucing renal calculi 2016, + left benign renal cysts  . Melanoma ([place])    R posterolateral neck and R nasal ala.  Most recent was left mid back 01/2016 (Dr. Tarri Glenn).  [name] Spinal headache   . Tobacco dependence    CT chest for lung ca screning 04/2015 showed benign findings; 1 yr repeat recommended    Past Surgical History:  Procedure Laterality Date  . CHOLECYSTECTOMY  1987  . COLONOSCOPY  12/24/10; 2014   Multiple TCSs: pt on 5 yr recall, next due 2019.  . INGUINAL HERNIA REPAIR  1984  . KNEE SURGERY Left 2015   cartilage repair  . KNEE SURGERY Right 1997   "          "  . LITHOTRIPSY    . NECK SURGERY  1989  . Thumb surgery Bilateral 2005   and 2006  . TOTAL KNEE ARTHROPLASTY Left 04/23/2016   Procedure: TOTAL KNEE ARTHROPLASTY;  Surgeon: Ninetta Lights, MD;  Location: [facility];  Service:  Orthopedics;  Laterality: Left;    Outpatient Medications Prior to Visit  Medication Sig Dispense Refill  . albuterol (PROAIR HFA) 108 (90 BASE) MCG/ACT inhaler Inhale 2 puffs into the lungs every 6 (six) hours as needed for wheezing or shortness of breath. 1 Inhaler 1  . aspirin EC 325 MG EC tablet Take 1 tablet (325 mg total) by mouth daily with breakfast. 30 tablet 0  . atorvastatin (LIPITOR) 20 MG tablet Take 1 tablet (20 mg total) by mouth daily. 30 tablet 2  . tiotropium (SPIRIVA HANDIHALER) 18 MCG inhalation capsule Place 1 capsule (18 mcg total) into inhaler and inhale daily. 30 capsule 12  . celecoxib (CELEBREX) 200 MG capsule Take 1 capsule (200 mg total) by mouth every 12 (twelve) hours. (Patient not taking: Reported on 08/13/2016) 60 capsule 0  . docusate sodium (COLACE) 100 MG capsule Take 1 capsule (100 mg total) by mouth 2 (two) times daily. (Patient not taking: Reported on 08/13/2016) 10 capsule 0  . methocarbamol (ROBAXIN) 500 MG tablet Take 1 tablet (500 mg total) by mouth every 6 (six) hours as needed for muscle spasms. (Patient not taking: Reported on 08/13/2016) 40 tablet 0  . ondansetron (ZOFRAN) 4 MG  tablet Take 1 tablet (4 mg total) by mouth every 6 (six) hours as needed for nausea. (Patient not taking: Reported on 08/13/2016) 20 tablet 0  . oxyCODONE-acetaminophen (PERCOCET/ROXICET) 5-325 MG tablet Take 1-2 tablets by mouth every 4 (four) hours as needed for moderate pain. (Patient not taking: Reported on 08/13/2016) 40 tablet 0   No facility-administered medications prior to visit.     Allergies  Allergen Reactions  . Codeine Hives  . Valium [Diazepam] Anaphylaxis    ROS As per HPI  PE: Blood pressure 116/81, pulse 84, temperature 97.8 F (36.6 C), temperature source Oral, resp. rate 16, height 6' 1.5" (1.867 m), weight 270 lb 8 oz (122.7 kg), SpO2 97 %. Gen: Alert, well appearing.  Patient is oriented to person, place, time, and situation. AFFECT: pleasant, lucid  thought and speech. CV: RRR, no m/r/g.   LUNGS: CTA bilat, nonlabored resps, good aeration in all lung fields.  LABS:   Lab Results  Component Value Date   WBC 16.7 (H) 04/24/2016   HGB 12.8 (L) 04/24/2016   HCT 40.3 04/24/2016   MCV 93.5 04/24/2016   PLT 238 04/24/2016   Lab Results  Component Value Date   CREATININE 1.36 (H) 04/24/2016   BUN 17 04/24/2016   NA 137 04/24/2016   K 5.2 (H) 04/24/2016   CL 102 04/24/2016   CO2 27 04/24/2016   Lab Results  Component Value Date   ALT 11 04/02/2016   AST 10 04/02/2016   ALKPHOS 58 04/02/2016   BILITOT 0.6 04/02/2016   Lab Results  Component Value Date   CHOL 203 (H) 07/14/2016   Lab Results  Component Value Date   HDL 37.60 (L) 07/14/2016   No results found for: [facility] Lab Results  Component Value Date   TRIG 230.0 (H) 07/14/2016   Lab Results  Component Value Date   CHOLHDL 5 07/14/2016    IMPRESSION AND PLAN:  1) COPD: The current medical regimen is effective;  continue present plan and medications.  2) Tob dependence: he has nearly quit.  Will order CT chest low dose for lung ca screening---it has been > 73yr since his initial screening and this f/u imaging was recommended by radiologist.  3) Hyperlipidemia: has been on statin x 62mo and is tolerating this fine. He wants to check FLP today since he's fasting so I did this.  4) CRI stage 2/3: BMET today.  An After Visit Summary was printed and given to the patient.  FOLLOW UP: Return in about 6 months (around 02/10/2017) for routine chronic illness f/u.  Signed:  Crissie Sickles, MD           08/13/2016

## 2016-08-14 ENCOUNTER — Other Ambulatory Visit: Payer: Self-pay | Admitting: *Deleted

## 2016-08-14 ENCOUNTER — Encounter: Payer: Self-pay | Admitting: *Deleted

## 2016-08-14 DIAGNOSIS — E785 Hyperlipidemia, unspecified: Secondary | ICD-10-CM

## 2016-08-21 ENCOUNTER — Ambulatory Visit (HOSPITAL_COMMUNITY): Admission: RE | Admit: 2016-08-21 | Payer: Medicare Other | Source: Ambulatory Visit

## 2016-08-27 ENCOUNTER — Ambulatory Visit (HOSPITAL_COMMUNITY)
Admission: RE | Admit: 2016-08-27 | Discharge: 2016-08-27 | Disposition: A | Discharge status: Home / Self Care | Payer: Medicare Other | Source: Ambulatory Visit | Attending: Family Medicine | Admitting: Family Medicine

## 2016-08-27 DIAGNOSIS — Z87891 Personal history of nicotine dependence: Secondary | ICD-10-CM | POA: Insufficient documentation

## 2016-08-27 DIAGNOSIS — I2510 Atherosclerotic heart disease of native coronary artery without angina pectoris: Secondary | ICD-10-CM | POA: Diagnosis not present

## 2016-08-27 DIAGNOSIS — I700 Atherosclerosis of aorta: Secondary | ICD-10-CM | POA: Diagnosis not present

## 2016-08-27 DIAGNOSIS — F17200 Nicotine dependence, unspecified, uncomplicated: Secondary | ICD-10-CM

## 2016-08-28 ENCOUNTER — Encounter: Payer: Self-pay | Admitting: Family Medicine

## 2016-12-26 DIAGNOSIS — S20212A Contusion of left front wall of thorax, initial encounter: Secondary | ICD-10-CM | POA: Diagnosis not present

## 2016-12-26 DIAGNOSIS — R0781 Pleurodynia: Secondary | ICD-10-CM | POA: Diagnosis not present

## 2016-12-26 DIAGNOSIS — R079 Chest pain, unspecified: Secondary | ICD-10-CM | POA: Diagnosis not present

## 2016-12-26 DIAGNOSIS — S299XXA Unspecified injury of thorax, initial encounter: Secondary | ICD-10-CM | POA: Diagnosis not present

## 2017-01-06 DIAGNOSIS — D225 Melanocytic nevi of trunk: Secondary | ICD-10-CM | POA: Diagnosis not present

## 2017-01-06 DIAGNOSIS — L570 Actinic keratosis: Secondary | ICD-10-CM | POA: Diagnosis not present

## 2017-01-06 DIAGNOSIS — D485 Neoplasm of uncertain behavior of skin: Secondary | ICD-10-CM | POA: Diagnosis not present

## 2017-01-06 DIAGNOSIS — Z85820 Personal history of malignant melanoma of skin: Secondary | ICD-10-CM | POA: Diagnosis not present

## 2017-04-17 ENCOUNTER — Ambulatory Visit: Payer: Medicare Other | Admitting: Family Medicine

## 2017-05-22 ENCOUNTER — Ambulatory Visit (INDEPENDENT_AMBULATORY_CARE_PROVIDER_SITE_OTHER): Payer: Medicare Other | Admitting: Family Medicine

## 2017-05-22 ENCOUNTER — Encounter: Payer: Self-pay | Admitting: Family Medicine

## 2017-05-22 VITALS — BP 130/88 | HR 76 | Temp 98.0°F | Resp 20 | Ht 74.0 in | Wt 250.0 lb

## 2017-05-22 DIAGNOSIS — R079 Chest pain, unspecified: Secondary | ICD-10-CM | POA: Diagnosis not present

## 2017-05-22 DIAGNOSIS — J438 Other emphysema: Secondary | ICD-10-CM | POA: Diagnosis not present

## 2017-05-22 DIAGNOSIS — I2510 Atherosclerotic heart disease of native coronary artery without angina pectoris: Secondary | ICD-10-CM | POA: Insufficient documentation

## 2017-05-22 DIAGNOSIS — E785 Hyperlipidemia, unspecified: Secondary | ICD-10-CM | POA: Diagnosis not present

## 2017-05-22 DIAGNOSIS — D126 Benign neoplasm of colon, unspecified: Secondary | ICD-10-CM

## 2017-05-22 DIAGNOSIS — Z800 Family history of malignant neoplasm of digestive organs: Secondary | ICD-10-CM | POA: Diagnosis not present

## 2017-05-22 DIAGNOSIS — Z720 Tobacco use: Secondary | ICD-10-CM

## 2017-05-22 DIAGNOSIS — I700 Atherosclerosis of aorta: Secondary | ICD-10-CM | POA: Insufficient documentation

## 2017-05-22 DIAGNOSIS — I25119 Atherosclerotic heart disease of native coronary artery with unspecified angina pectoris: Secondary | ICD-10-CM | POA: Insufficient documentation

## 2017-05-22 LAB — COMPLETE METABOLIC PANEL WITH GFR
AG Ratio: 2 (calc) (ref 1.0–2.5)
ALBUMIN MSPROF: 4.3 g/dL (ref 3.6–5.1)
ALKALINE PHOSPHATASE (APISO): 59 U/L (ref 40–115)
ALT: 9 U/L (ref 9–46)
AST: 11 U/L (ref 10–35)
BUN: 13 mg/dL (ref 7–25)
CO2: 30 mmol/L (ref 20–32)
Calcium: 9.7 mg/dL (ref 8.6–10.3)
Chloride: 104 mmol/L (ref 98–110)
Creat: 1.09 mg/dL (ref 0.70–1.25)
GFR, EST NON AFRICAN AMERICAN: 70 mL/min/{1.73_m2} (ref >=60)
GFR, Est African American: 81 mL/min/{1.73_m2} (ref >=60)
GLOBULIN: 2.2 g/dL (ref 1.9–3.7)
Glucose, Bld: 96 mg/dL (ref 65–139)
Potassium: 4.7 mmol/L (ref 3.5–5.3)
SODIUM: 140 mmol/L (ref 135–146)
Total Bilirubin: 0.5 mg/dL (ref 0.2–1.2)
Total Protein: 6.5 g/dL (ref 6.1–8.1)

## 2017-05-22 LAB — URINALYSIS, ROUTINE W REFLEX MICROSCOPIC
BILIRUBIN URINE: NEGATIVE
Bacteria, UA: NONE SEEN /HPF
Glucose, UA: NEGATIVE
Hgb urine dipstick: NEGATIVE
Hyaline Cast: NONE SEEN /LPF
Ketones, ur: NEGATIVE
NITRITE: NEGATIVE
PH: 6.5 (ref 5.0–8.0)
Protein, ur: NEGATIVE
RBC / HPF: NONE SEEN /HPF (ref 0–2)
SPECIFIC GRAVITY, URINE: 1.005 (ref 1.001–1.03)
Squamous Epithelial / LPF: NONE SEEN /HPF (ref <=5)
WBC, UA: NONE SEEN /HPF (ref 0–5)

## 2017-05-22 LAB — CBC
HEMATOCRIT: 45.5 % (ref 38.5–50.0)
HEMOGLOBIN: 15 g/dL (ref 13.2–17.1)
MCH: 29.5 pg (ref 27.0–33.0)
MCHC: 33 g/dL (ref 32.0–36.0)
MCV: 89.6 fL (ref 80.0–100.0)
MPV: 11.1 fL (ref 7.5–12.5)
Platelets: 245 10*3/uL (ref 140–400)
RBC: 5.08 10*6/uL (ref 4.20–5.80)
RDW: 12.2 % (ref 11.0–15.0)
WBC: 6.3 10*3/uL (ref 3.8–10.8)

## 2017-05-22 LAB — LIPID PANEL
CHOL/HDL RATIO: 4.7 (calc) (ref <5.0)
CHOLESTEROL: 193 mg/dL (ref <200)
HDL: 41 mg/dL (ref >40)
LDL Cholesterol (Calc): 133 mg/dL (calc) — ABNORMAL HIGH
NON-HDL CHOLESTEROL (CALC): 152 mg/dL — AB (ref <130)
Triglycerides: 87 mg/dL (ref <150)

## 2017-05-22 NOTE — Progress Notes (Signed)
Chief Complaint  Patient presents with  . COPD  New patient to establish.  Prior care was less convenient geographically.  Patient states he feels well. He admits to having shortness of breath and COPD.  He is a smoker.  He states he smokes about half pack a day.  He acknowledges that he needs to quit.  We did spend some time discussing smoking cessation.  The importance of smoking cessation for lung disease, cardiovascular disease, and cancer prevention.  We discussed the various methods of quitting smoking.  He states he is not ready to quit smoking at this time. He does admit to "crushing" chest pain.  He has to sit down and rest and take aspirin for it to go away.  This is been worsening for the last couple of months.  I explained to him that he has hyperlipidemia, and smoking.  On his last CAT scan of his chest remark was made of atherosclerosis of his coronary arteries.  I have concerned that he has angina.  An EKG done in the office today is abnormal.  He is referred to the cardiology urgently for consultation.  He is advised to avoid maximum exertion until this time.  Again, smoking cessation is advised. He previously was on Lipitor.  He quit taking this medication for unclear reasons. He does have inhalers to use as needed.  He states he only uses them for "chest cold symptoms". He states that he has history of multiple colon polyps.  There were 17 on his first colonoscopy.  His last colonoscopy was in 11-22-2010.  His mother died of colon cancer.  He is due for GI referral and colonoscopy.  Cardiology clearance is necessary first. Patient refuses immunizations.  Flu shot is recommended.   Patient Active Problem List   Diagnosis Date Noted  . Coronary atherosclerosis 05/22/2017  . Aortic atherosclerosis ([place]) 05/22/2017  . Tobacco abuse 05/22/2017  . 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 Family history of colon cancer in mother 05/22/2017  . Hyperlipidemia 07/14/2016  . S/P total knee arthroplasty  04/23/2016  . COPD (chronic obstructive pulmonary disease) ([place]) 11/08/2015    Outpatient Encounter Prescriptions as of 05/22/2017  Medication Sig  . albuterol (PROAIR HFA) 108 (90 BASE) MCG/ACT inhaler Inhale 2 puffs into the lungs every 6 (six) hours as needed for wheezing or shortness of breath.  [name] aspirin EC 325 MG EC tablet Take 1 tablet (325 mg total) by mouth daily with breakfast.  . tiotropium (SPIRIVA HANDIHALER) 18 MCG inhalation capsule Place 1 capsule (18 mcg total) into inhaler and inhale daily.   No facility-administered encounter medications on file as of 05/22/2017.     Past Medical History:  Diagnosis Date  . Acute prostatitis 01/2015  . Allergy   . Basal cell carcinoma 11-22-15   Multiple: back, left shin, left arm  . CAP (community acquired pneumonia) 06/26/2015  . Chronic renal insufficiency, stage II (mild)    stage II/II: CrCl 60s.  . Complication of anesthesia   . COPD (chronic obstructive pulmonary disease) ([place]) 05/07/15   Long time smoker + COPD changes noted on lung ca screening CT  . Diverticulitis   . Encounter for screening for lung cancer    Screening CT ok 08/27/16--repeat in 1 yr recommended.  . Family history of colon cancer in mother   . Hearing impairment    hearing aids  . History of adenomatous polyp of colon 12/24/10  . Hyperlipidemia 07/14/2016   started atorv 20 07/16/16  .  Kidney stones    bilat nonobstrucing renal calculi 2016, + left benign renal cysts  . Melanoma ([place])    R posterolateral neck and R nasal ala.  Most recent was left mid back 01/2016 (Dr. Tarri Glenn).  [name] Spinal headache   . Tobacco dependence    CT chest for lung ca screning 04/2015 showed benign findings; 1 yr repeat recommended    Past Surgical History:  Procedure Laterality Date  . CHOLECYSTECTOMY  1987  . COLONOSCOPY  12/24/10; 2014   Multiple TCSs: pt on 5 yr recall, next due 2019.  . INGUINAL HERNIA REPAIR  1984  . JOINT REPLACEMENT     left  . KNEE SURGERY Left  2015   cartilage repair  . KNEE SURGERY Right 1997   "          "  . LITHOTRIPSY    . NECK SURGERY  1989  . Thumb surgery Bilateral 2005   and 2006  . TOTAL KNEE ARTHROPLASTY Left 04/23/2016   Procedure: TOTAL KNEE ARTHROPLASTY;  Surgeon: Ninetta Lights, MD;  Location: [facility];  Service: Orthopedics;  Laterality: Left;    Social History   Social History  . Marital status: Married    Spouse name:  Neoma Laming  . Number of children: 2  . Years of education: 12   Occupational History  . Not on file.   Social History Main Topics  . Smoking status: Current Every Day Smoker    Packs/day: 0.50    Years: 40.00    Types: Cigarettes    Start date: 08/04/1968  . Smokeless tobacco: Never Used     Comment: half pack a day  . Alcohol use No  . Drug use: No  . Sexual activity: Yes   Other Topics Concern  . Not on file   Social History Narrative   Married, 3 daughters.   Educ: HS + 2 yr technical school.   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   Relocated to [facility] 2016.   Tob: 50 pack-yr hx; cutting back as of 01/2015.   No alc or drugs.    Family History  Problem Relation Age of Onset  . Colon cancer Mother   . Cancer Mother   . Lung cancer Father   . Cancer Father   . Brain cancer Brother   . Cancer Brother     Review of Systems  Constitutional: Negative for chills, fever and weight loss.  HENT: Negative for congestion and hearing loss.   Eyes: Negative for blurred vision and pain.  Respiratory: Positive for shortness of breath. Negative for cough.   Cardiovascular: Positive for chest pain. Negative for palpitations, claudication and leg swelling.  Gastrointestinal: Negative for abdominal pain, constipation, diarrhea and heartburn.  Genitourinary: Negative for dysuria and frequency.  Musculoskeletal: Negative for falls, joint pain and myalgias.  Neurological: Negative for dizziness, seizures and headaches.  Psychiatric/Behavioral: Negative for depression. The  patient is not nervous/anxious and does not have insomnia.     BP 130/88 (BP Location: Right Arm, Patient Position: Sitting, Cuff Size: Large)   Pulse 76   Temp 98 F (36.7 C) (Temporal)   Resp 20   Ht 6\' 2"  (1.88 m)   Wt 250 lb 0.6 oz (113.4 kg)   SpO2 100%   BMI 32.10 kg/m   Physical Exam  Constitutional: He is oriented to person, place, and time. He appears well-developed and well-nourished.  HENT:  Head: Normocephalic and atraumatic.  Right Ear: External ear normal.  Left Ear: External ear normal.  Mouth/Throat: Oropharynx is clear and moist.  Eyes: Pupils are equal, round, and reactive to light. Conjunctivae are normal.  Neck: Normal range of motion. Neck supple. No thyromegaly present.  Cardiovascular: Normal rate, regular rhythm and normal heart sounds.   Pulmonary/Chest: Effort normal and breath sounds normal. No respiratory distress.  Abdominal: Soft. Bowel sounds are normal.  Musculoskeletal: Normal range of motion. He exhibits no edema.  Total knee arthroplasty left  Lymphadenopathy:    He has no cervical adenopathy.  Neurological: He is alert and oriented to person, place, and time.  Gait normal  Skin: Skin is warm and dry.  Psychiatric: He has a normal mood and affect. His behavior is normal. Thought content normal.  Nursing note and vitals reviewed.  EKG- poss old infarct  1. 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 Aortic atherosclerosis ([facility])  3. Chest pain, unspecified type - EKG 12-Lead - Ambulatory referral to Cardiology  4. Other emphysema ([place])  5. Hyperlipidemia, unspecified hyperlipidemia type - Lipid panel  6. Tobacco abuse Counseled to quit   Patient Instructions  Need referral to cardiology Need to see Dr Tarri Glenn yearly Need updated lab  tests Recommend flu shot at prevnar Try to STOP SMOKING See me in 3 months   Raylene Everts, MD

## 2017-05-22 NOTE — Patient Instructions (Signed)
Need referral to cardiology Need to see Dr Tarri Glenn yearly Need updated lab tests Recommend flu shot at prevnar Try to STOP SMOKING See me in 3 months

## 2017-05-26 NOTE — Progress Notes (Signed)
Pls change PCP on pt's chart to Dr. Blanchie Serve at [facility].  Pt established care there 05/22/17.-thx

## 2017-06-10 ENCOUNTER — Encounter: Payer: Self-pay | Admitting: *Deleted

## 2017-06-10 ENCOUNTER — Ambulatory Visit (INDEPENDENT_AMBULATORY_CARE_PROVIDER_SITE_OTHER): Payer: Medicare Other | Admitting: Cardiology

## 2017-06-10 ENCOUNTER — Encounter: Payer: Self-pay | Admitting: Cardiology

## 2017-06-10 VITALS — BP 112/90 | HR 79 | Ht 74.0 in | Wt 252.8 lb

## 2017-06-10 DIAGNOSIS — I2510 Atherosclerotic heart disease of native coronary artery without angina pectoris: Secondary | ICD-10-CM

## 2017-06-10 DIAGNOSIS — R079 Chest pain, unspecified: Secondary | ICD-10-CM | POA: Diagnosis not present

## 2017-06-10 NOTE — Progress Notes (Signed)
Done and faxed

## 2017-06-10 NOTE — Patient Instructions (Signed)
Medication Instructions:  DECREASE ASPIRIN TO 81 MG DAILY   Labwork: NONE  Testing/Procedures: Your physician has requested that you have a dobutamine myoview. For furth information please visit [email or web address]. Please follow instruction sheet, as given.    Follow-Up: Your physician recommends that you schedule a follow-up appointment in: 1 MONTH    Any Other Special Instructions Will Be Listed Below (If Applicable).     If you need a refill on your cardiac medications before your next appointment, please call your pharmacy.

## 2017-06-10 NOTE — Progress Notes (Signed)
Clinical Summary [name] is a 66 y.o.male seen as new consult, referred by Dr Meda Coffee for chest pain.   1. Chest pain - CAD noted by prior CT chest - chest pain started several years ago. Pressure like pain midchest, can be 5-10/10 in severity. +SOB, just "feels weird all over" Can occur at rest or with activity - recalls episode in summer while out side working severe pain fell down to ground - pain lasts up to 5-10 minutes. Not positional. No relation to food - episodes every 2-3 weeks. Increase in frequency, longer in duration - heavies activity is yardwork. Increase DOE with activities. - he reports a remote cath several years in [place].  CAD risk factors: +tobacco, age  - cannot run on treadmill due knee replacement and pain   2. COPD - followed by pcp   Past Medical History:  Diagnosis Date  . Acute prostatitis 01/2015  . Allergy   . Basal cell carcinoma 2017   Multiple: back, left shin, left arm  . CAP (community acquired pneumonia) 06/26/2015  . Chronic renal insufficiency, stage II (mild)    stage II/II: CrCl 60s.  . Complication of anesthesia   . COPD (chronic obstructive pulmonary disease) ([place]) 05/07/15   Long time smoker + COPD changes noted on lung ca screening CT  . Diverticulitis   . Encounter for screening for lung cancer    Screening CT ok 08/27/16--repeat in 1 yr recommended.  . Family history of colon cancer in mother   . Hearing impairment    hearing aids  . History of adenomatous polyp of colon 12/24/10  . Hyperlipidemia 07/14/2016   started atorv 20 07/16/16  . Kidney stones    bilat nonobstrucing renal calculi 2016, + left benign renal cysts  . Melanoma ([place])    R posterolateral neck and R nasal ala.  Most recent was left mid back 01/2016 (Dr. Tarri Glenn).  [name] Spinal headache   . Tobacco dependence    CT chest for lung ca screning 04/2015 showed benign findings; 1 yr repeat recommended     Allergies  Allergen Reactions  . Codeine Hives  .  Valium [Diazepam] Anaphylaxis     Current Outpatient Medications  Medication Sig Dispense Refill  . albuterol (PROAIR HFA) 108 (90 BASE) MCG/ACT inhaler Inhale 2 puffs into the lungs every 6 (six) hours as needed for wheezing or shortness of breath. 1 Inhaler 1  . aspirin EC 325 MG EC tablet Take 1 tablet (325 mg total) by mouth daily with breakfast. 30 tablet 0  . tiotropium (SPIRIVA HANDIHALER) 18 MCG inhalation capsule Place 1 capsule (18 mcg total) into inhaler and inhale daily. 30 capsule 12   No current facility-administered medications for this visit.      Past Surgical History:  Procedure Laterality Date  . CHOLECYSTECTOMY  1987  . COLONOSCOPY  12/24/10; 2014   Multiple TCSs: pt on 5 yr recall, next due 2019.  . INGUINAL HERNIA REPAIR  1984  . JOINT REPLACEMENT     left  . KNEE SURGERY Left 2015   cartilage repair  . KNEE SURGERY Right 1997   "          "  . LITHOTRIPSY    . NECK SURGERY  1989  . Thumb surgery Bilateral 2005   and 2006     Allergies  Allergen Reactions  . Codeine Hives  . Valium [Diazepam] Anaphylaxis      Family History  Problem Relation Age  of Onset  . Colon cancer Mother   . Cancer Mother   . Lung cancer Father   . Cancer Father   . Brain cancer Brother   . Cancer Brother      Social History Craig Mccann reports that he has been smoking cigarettes.  He started smoking about 48 years ago. He has a 20.00 pack-year smoking history. he has never used smokeless tobacco. Craig Mccann reports that he does not drink alcohol.   Review of Systems CONSTITUTIONAL: No weight loss, fever, chills, weakness or fatigue.  HEENT: Eyes: No visual loss, blurred vision, double vision or yellow sclerae.No hearing loss, sneezing, congestion, runny nose or sore throat.  SKIN: No rash or itching.  CARDIOVASCULAR: per hpi RESPIRATORY: per hpi GASTROINTESTINAL: No anorexia, nausea, vomiting or diarrhea. No abdominal pain or blood.  GENITOURINARY: No  burning on urination, no polyuria NEUROLOGICAL: No headache, dizziness, syncope, paralysis, ataxia, numbness or tingling in the extremities. No change in bowel or bladder control.  MUSCULOSKELETAL: No muscle, back pain, joint pain or stiffness.  LYMPHATICS: No enlarged nodes. No history of splenectomy.  PSYCHIATRIC: No history of depression or anxiety.  ENDOCRINOLOGIC: No reports of sweating, cold or heat intolerance. No polyuria or polydipsia.  [name]   Physical Examination Vitals:   06/10/17 0915 06/10/17 0921  BP: (!) 124/96 112/90  Pulse: 84 79  SpO2: 92% 96%   Vitals:   06/10/17 0915  Weight: 252 lb 12.8 oz (114.7 kg)  Height: 6\' 2"  (1.88 m)    Gen: resting comfortably, no acute distress HEENT: no scleral icterus, pupils equal round and reactive, no palptable cervical adenopathy,  CV: RRR, no m/r/g, no jvd Resp: Clear to auscultation bilaterally GI: abdomen is soft, non-tender, non-distended, normal bowel sounds, no hepatosplenomegaly MSK: extremities are warm, no edema.  Skin: warm, no rash Neuro:  no focal deficits Psych: appropriate affect      Assessment and Plan  1. Chest pain/CAD - CAD risk factors with evidence of CAD by recent CT chest - we will plan for a dobutamine nuclear stress test to further evaluate (he has COPD and active wheezing, avoid regadenoson) - change ASA to 81mg  only. Unclear statin history, appears had been on previously, does not appear to be on currently. Review history, consider at next visit.    F/u 1 month   Arnoldo Lenis, M.D.

## 2017-06-12 ENCOUNTER — Encounter: Payer: Self-pay | Admitting: Cardiology

## 2017-06-15 DIAGNOSIS — D485 Neoplasm of uncertain behavior of skin: Secondary | ICD-10-CM | POA: Diagnosis not present

## 2017-06-15 DIAGNOSIS — L0100 Impetigo, unspecified: Secondary | ICD-10-CM | POA: Diagnosis not present

## 2017-06-15 DIAGNOSIS — L308 Other specified dermatitis: Secondary | ICD-10-CM | POA: Diagnosis not present

## 2017-06-18 DIAGNOSIS — L03312 Cellulitis of back [any part except buttock]: Secondary | ICD-10-CM | POA: Diagnosis not present

## 2017-06-19 ENCOUNTER — Ambulatory Visit (HOSPITAL_COMMUNITY)
Admission: RE | Admit: 2017-06-19 | Discharge: 2017-06-19 | Disposition: A | Discharge status: Home / Self Care | Payer: Medicare Other | Source: Ambulatory Visit | Attending: Cardiology | Admitting: Cardiology

## 2017-06-19 ENCOUNTER — Encounter (HOSPITAL_BASED_OUTPATIENT_CLINIC_OR_DEPARTMENT_OTHER)
Admission: RE | Admit: 2017-06-19 | Discharge: 2017-06-19 | Disposition: A | Discharge status: Home / Self Care | Payer: Medicare Other | Source: Ambulatory Visit | Attending: Cardiology | Admitting: Cardiology

## 2017-06-19 DIAGNOSIS — R079 Chest pain, unspecified: Secondary | ICD-10-CM | POA: Insufficient documentation

## 2017-06-19 DIAGNOSIS — R9439 Abnormal result of other cardiovascular function study: Secondary | ICD-10-CM | POA: Diagnosis not present

## 2017-06-19 LAB — NM MYOCAR MULTI W/SPECT W/WALL MOTION / EF
CHL CUP NUCLEAR SDS: 1
CSEPPHR: 169 {beats}/min
LVDIAVOL: 147 mL (ref 62–150)
LVSYSVOL: 79 mL
MPHR: 153 {beats}/min
Percent HR: 110 %
RATE: 0.44
Rest HR: 75 {beats}/min
SRS: 3
SSS: 4
TID: 0.97

## 2017-06-19 MED ORDER — ATROPINE SULFATE 1 MG/ML IJ SOLN
INTRAMUSCULAR | Status: AC
  Administered 2017-06-19: 0.5 mg via INTRAVENOUS
  Filled 2017-06-19: qty 1

## 2017-06-19 MED ORDER — SODIUM CHLORIDE 0.9% FLUSH
INTRAVENOUS | Status: AC
  Administered 2017-06-19: 10 mL via INTRAVENOUS
  Filled 2017-06-19: qty 10

## 2017-06-19 MED ORDER — TECHNETIUM TC 99M TETROFOSMIN IV KIT
10.0000 | PACK | Freq: Once | INTRAVENOUS | Status: AC | PRN
  Administered 2017-06-19: 10 via INTRAVENOUS

## 2017-06-19 MED ORDER — TECHNETIUM TC 99M TETROFOSMIN IV KIT
30.0000 | PACK | Freq: Once | INTRAVENOUS | Status: AC | PRN
  Administered 2017-06-19: 30 via INTRAVENOUS

## 2017-06-19 MED ORDER — DOBUTAMINE IN D5W 4-5 MG/ML-% IV SOLN
INTRAVENOUS | Status: AC
  Administered 2017-06-19: 1000000 ug via INTRAVENOUS
  Filled 2017-06-19: qty 250

## 2017-06-22 ENCOUNTER — Telehealth: Payer: Self-pay

## 2017-06-22 DIAGNOSIS — R079 Chest pain, unspecified: Secondary | ICD-10-CM

## 2017-06-22 NOTE — Telephone Encounter (Signed)
Called pt., no answer. Left message for pt to return call.  

## 2017-06-22 NOTE — Telephone Encounter (Signed)
Called pt, informed him of stress test results. He voiced understanding. Stated he will have echo done. He states he has had a few minutes of chest pain here and there but goes away when he rests. Will have front office staff schedule echo.

## 2017-06-22 NOTE — Telephone Encounter (Signed)
-----   Message from Arnoldo Lenis, MD sent at 06/22/2017 12:17 PM EST ----- Stress test suggests there may be a small blockage on the bottom of the heart. This is something we may be able to treat with medications alone. It also suggests that the pumping function of the heart may be a little weak. Please order an echo for chest pain to better evalaute this. We will discuss in detail and reevaluate his symptoms at our f/u. How have his symptoms been recently?  Carlyle Dolly MD

## 2017-06-23 DIAGNOSIS — Z4802 Encounter for removal of sutures: Secondary | ICD-10-CM | POA: Diagnosis not present

## 2017-06-24 ENCOUNTER — Ambulatory Visit (HOSPITAL_COMMUNITY)
Admission: RE | Admit: 2017-06-24 | Discharge: 2017-06-24 | Disposition: A | Discharge status: Home / Self Care | Payer: Medicare Other | Source: Ambulatory Visit | Attending: Cardiology | Admitting: Cardiology

## 2017-06-24 DIAGNOSIS — R079 Chest pain, unspecified: Secondary | ICD-10-CM

## 2017-06-24 DIAGNOSIS — I5030 Unspecified diastolic (congestive) heart failure: Secondary | ICD-10-CM | POA: Insufficient documentation

## 2017-06-24 LAB — ECHOCARDIOGRAM COMPLETE
CHL CUP RV SYS PRESS: 23 mmHg
CHL CUP TV REG PEAK VELOCITY: 224 cm/s
E decel time: 288 msec
E/e' ratio: 9.94
FS: 27 % — AB (ref 28–44)
IVS/LV PW RATIO, ED: 0.98
LA diam index: 1.37 cm/m2
LA vol: 47.1 mL
LASIZE: 34 mm
LAVOLA4C: 41.9 mL
LAVOLIN: 19 mL/m2
LEFT ATRIUM END SYS DIAM: 34 mm
LV E/e'average: 9.94
LV dias vol: 132 mL (ref 62–150)
LV e' LATERAL: 4.9 cm/s
LV sys vol index: 24 mL/m2
LVDIAVOLIN: 53 mL/m2
LVEEMED: 9.94
LVOT VTI: 14.4 cm
LVOT area: 3.46 cm2
LVOT diameter: 21 mm
LVOT peak grad rest: 3 mmHg
LVOTPV: 80.1 cm/s
LVOTSV: 50 mL
LVSYSVOL: 59 mL
Lateral S' vel: 13.1 cm/s
MV Dec: 288
MV pk A vel: 93.6 m/s
MVPKEVEL: 48.7 m/s
PW: 13.1 mm — AB (ref 0.6–1.1)
RV TAPSE: 24.9 mm
Simpson's disk: 55
Stroke v: 73 ml
TDI e' lateral: 4.9
TDI e' medial: 4.68
TR max vel: 224 cm/s

## 2017-06-24 NOTE — Progress Notes (Signed)
*  PRELIMINARY RESULTS* Echocardiogram 2D Echocardiogram has been performed.  Samuel Germany 06/24/2017, 1:32 PM

## 2017-07-10 ENCOUNTER — Telehealth: Payer: Self-pay | Admitting: Cardiology

## 2017-07-10 ENCOUNTER — Ambulatory Visit (INDEPENDENT_AMBULATORY_CARE_PROVIDER_SITE_OTHER): Payer: Medicare Other | Admitting: Cardiology

## 2017-07-10 ENCOUNTER — Encounter: Payer: Self-pay | Admitting: Cardiology

## 2017-07-10 VITALS — BP 110/90 | HR 84 | Ht 74.0 in | Wt 253.0 lb

## 2017-07-10 DIAGNOSIS — R9439 Abnormal result of other cardiovascular function study: Secondary | ICD-10-CM | POA: Diagnosis not present

## 2017-07-10 DIAGNOSIS — I25118 Atherosclerotic heart disease of native coronary artery with other forms of angina pectoris: Secondary | ICD-10-CM | POA: Diagnosis not present

## 2017-07-10 DIAGNOSIS — I2510 Atherosclerotic heart disease of native coronary artery without angina pectoris: Secondary | ICD-10-CM

## 2017-07-10 DIAGNOSIS — I209 Angina pectoris, unspecified: Secondary | ICD-10-CM

## 2017-07-10 MED ORDER — NITROGLYCERIN 0.4 MG SL SUBL: 0.4000 mg | SUBLINGUAL_TABLET | SUBLINGUAL | 3 refills | Status: DC | PRN

## 2017-07-10 MED ORDER — METOPROLOL SUCCINATE ER 25 MG PO TB24: 12.5000 mg | ORAL_TABLET | Freq: Every day | ORAL | 1 refills | Status: DC

## 2017-07-10 NOTE — Patient Instructions (Signed)
Your physician recommends that you schedule a follow-up appointment in: [place] has recommended you make the following change in your medication:   START TOPROL XL 12.5 MG (1/2 TABLET) DAILY  TAKE NITROGLYCERIN UNDER THE TONGUE AS NEEDED FOR CHEST PAIN - TAKE EVERY 5-10 MINS AFTER 2 DOSES AND NO RELIEF REPORT TO ED    Craig Mccann [id number] Dept: [phone] Loc: [phone]  Craig Mccann  07/10/2017  You are scheduled for a Cardiac Catheterization on Tuesday, December 11 with Dr. Daneen Schick.  1. Please arrive at the [facility] (Main Entrance A) at [facility]: [name], [name] [address] at 8:30 AM (two hours before your procedure to ensure your preparation). Free valet parking service is available.   Special note: Every effort is made to have your procedure done on time. Please understand that emergencies sometimes delay scheduled procedures.  2. Diet: Do not eat or drink anything after midnight prior to your procedure except sips of water to take medications.  3. Labs: Your labs will be performed at the hospital after you arrive for your procedure.  4. Medication instructions in preparation for your procedure:    Current Outpatient Medications (Cardiovascular):  .  metoprolol succinate (TOPROL XL) 25 MG 24 hr tablet, Take 0.5 tablets (12.5 mg total) by mouth daily. .  nitroGLYCERIN (NITROSTAT) 0.4 MG SL tablet, Place 1 tablet (0.4 mg total) under the tongue every 5 (five) minutes as needed for chest pain.  Current Outpatient Medications (Respiratory):  .  albuterol (PROAIR HFA) 108 (90 BASE) MCG/ACT inhaler, Inhale 2 puffs into the lungs every 6 (six) hours as needed for wheezing or shortness of breath. .  tiotropium (SPIRIVA HANDIHALER) 18 MCG inhalation capsule, Place 1 capsule (18 mcg total)  into inhaler and inhale daily.  Current Outpatient Medications (Analgesics):  .  aspirin EC 81 MG tablet, Take 81 mg daily by mouth.   *For reference purposes while preparing patient instructions.   Delete this med list prior to printing instructions for patient.*  On the morning of your procedure, take your Aspirin and any morning medicines NOT listed above.  You may use sips of water.  5. Plan for one night stay--bring personal belongings. 6. Bring a current list of your medications and current insurance cards. 7. You MUST have a responsible person to drive you home. 8. Someone MUST be with you the first 24 hours after you arrive home or your discharge will be delayed. 9. Please wear clothes that are easy to get on and off and wear slip-on shoes.  Thank you for allowing [place] to care for you!   --  Invasive Cardiovascular services

## 2017-07-10 NOTE — Telephone Encounter (Signed)
[facility] @ 10:30AM DR Tamala Julian

## 2017-07-10 NOTE — Progress Notes (Signed)
Clinical Summary Craig Mccann is a 66 y.o.male  seen as a focused follow up for chest pain and recent abnormal stress test.   1. Chest pain - CAD noted by prior CT chest - chest pain started several years ago. Pressure like pain midchest, can be 5-10/10 in severity. +SOB, just "feels weird all over" Can occur at rest or with activity - recalls episode in summer while out side working severe pain fell down to ground - pain lasts up to 5-10 minutes. Not positional. No relation to food - episodes every 2-3 weeks. Increase in frequency, longer in duration - heavies activity is yardwork. Increase DOE with activities. - he reports a remote cath several years in [place].  CAD risk factors: +tobacco, age   06/2017 nuclear stress: moderate mid inferoseptal and mid inferior defect with peri-infarct ischemia. LVEF 46% - 06/2017 echo LVEF 23-53%, grade I diastolic dysfunction  - reports episodes of chest pain since last vitis, can last up 20 minutes up to 1 hr. Occurred at rest. Sharp pain/pressure midchest, 6/10 in severity. Felt SOB, nauseous.     Past Medical History:  Diagnosis Date  . Acute prostatitis 01/2015  . Allergy   . Basal cell carcinoma 2017   Multiple: back, left shin, left arm  . CAP (community acquired pneumonia) 06/26/2015  . Chronic renal insufficiency, stage II (mild)    stage II/II: CrCl 60s.  . Complication of anesthesia   . COPD (chronic obstructive pulmonary disease) ([place]) 05/07/15   Long time smoker + COPD changes noted on lung ca screening CT  . Diverticulitis   . Encounter for screening for lung cancer    Screening CT ok 08/27/16--repeat in 1 yr recommended.  . Family history of colon cancer in mother   . Hearing impairment    hearing aids  . History of adenomatous polyp of colon 12/24/10  . Hyperlipidemia 07/14/2016   started atorv 20 07/16/16  . Kidney stones    bilat nonobstrucing renal calculi 2016, + left benign renal cysts  . Melanoma ([place])    R  posterolateral neck and R nasal ala.  Most recent was left mid back 01/2016 (Dr. Tarri Glenn).  [name] Spinal headache   . Tobacco dependence    CT chest for lung ca screning 04/2015 showed benign findings; 1 yr repeat recommended     Allergies  Allergen Reactions  . Codeine Hives  . Valium [Diazepam] Anaphylaxis     Current Outpatient Medications  Medication Sig Dispense Refill  . albuterol (PROAIR HFA) 108 (90 BASE) MCG/ACT inhaler Inhale 2 puffs into the lungs every 6 (six) hours as needed for wheezing or shortness of breath. 1 Inhaler 1  . aspirin EC 81 MG tablet Take 81 mg daily by mouth.    . tiotropium (SPIRIVA HANDIHALER) 18 MCG inhalation capsule Place 1 capsule (18 mcg total) into inhaler and inhale daily. 30 capsule 12   No current facility-administered medications for this visit.      Past Surgical History:  Procedure Laterality Date  . CHOLECYSTECTOMY  1987  . COLONOSCOPY  12/24/10; 2014   Multiple TCSs: pt on 5 yr recall, next due 2019.  . INGUINAL HERNIA REPAIR  1984  . JOINT REPLACEMENT     left  . KNEE SURGERY Left 2015   cartilage repair  . KNEE SURGERY Right 1997   "          "  . LITHOTRIPSY    . NECK SURGERY  1989  .  Thumb surgery Bilateral 2005   and 2006  . TOTAL KNEE ARTHROPLASTY Left 04/23/2016   Procedure: TOTAL KNEE ARTHROPLASTY;  Surgeon: Ninetta Lights, MD;  Location: [facility];  Service: Orthopedics;  Laterality: Left;     Allergies  Allergen Reactions  . Codeine Hives  . Valium [Diazepam] Anaphylaxis      Family History  Problem Relation Age of Onset  . Colon cancer Mother   . Cancer Mother   . Lung cancer Father   . Cancer Father   . Brain cancer Brother   . Cancer Brother      Social History Craig Mccann reports that he has been smoking cigarettes.  He started smoking about 48 years ago. He has a 20.00 pack-year smoking history. he has never used smokeless tobacco. Craig Mccann reports that he does not drink alcohol.   Review of  Systems CONSTITUTIONAL: No weight loss, fever, chills, weakness or fatigue.  HEENT: Eyes: No visual loss, blurred vision, double vision or yellow sclerae.No hearing loss, sneezing, congestion, runny nose or sore throat.  SKIN: No rash or itching.  CARDIOVASCULAR: per hpi RESPIRATORY: per hpi GASTROINTESTINAL: No anorexia, nausea, vomiting or diarrhea. No abdominal pain or blood.  GENITOURINARY: No burning on urination, no polyuria NEUROLOGICAL: No headache, dizziness, syncope, paralysis, ataxia, numbness or tingling in the extremities. No change in bowel or bladder control.  MUSCULOSKELETAL: No muscle, back pain, joint pain or stiffness.  LYMPHATICS: No enlarged nodes. No history of splenectomy.  PSYCHIATRIC: No history of depression or anxiety.  ENDOCRINOLOGIC: No reports of sweating, cold or heat intolerance. No polyuria or polydipsia.  [name]   Physical Examination Vitals:   07/10/17 0844  BP: 110/90  Pulse: 84  SpO2: 98%   Vitals:   07/10/17 0844  Weight: 253 lb (114.8 kg)  Height: 6\' 2"  (1.88 m)    Gen: resting comfortably, no acute distress HEENT: no scleral icterus, pupils equal round and reactive, no palptable cervical adenopathy,  CV: RRR, no m/r/g, no jvd Resp: Clear to auscultation bilaterally GI: abdomen is soft, non-tender, non-distended, normal bowel sounds, no hepatosplenomegaly MSK: extremities are warm, no edema.  Skin: warm, no rash Neuro:  no focal deficits Psych: appropriate affect   Diagnostic Studies  06/2017 nuclear stress  There was no ST segment deviation noted during stress.  Defect 1: There is a medium defect of moderate severity present in the mid inferoseptal and mid inferior location.  Findings consistent with prior myocardial infarction with peri-infarct ischemia. Variable soft tissue attenuation artifact is also contributing to defect.  This is a low to intermediate risk study.  Nuclear stress EF: 46%.  11.2018 echoStudy  Conclusions  - Left ventricle: The cavity size was normal. Wall thickness was   increased increased in a pattern of mild to moderate LVH.   Systolic function was mildly reduced. The estimated ejection   fraction was in the range of 45% to 50%. Doppler parameters are   consistent with abnormal left ventricular relaxation (grade 1   diastolic dysfunction). - Aortic valve: Valve area (VTI): 1.97 cm^2. Valve area (Vmax):   2.04 cm^2. Valve area (Vmean): 2 cm^2. - Technically adequate study.  Assessment and Plan   1. Chest pain/CAD - CAD risk factors with evidence of CAD by recent CT chest - recent abnormal stress test with ongoing symptoms.  - we will plan for a left heart cath to further evaluate - start Toprol XL 12.mg daily for antianginal and low normal to mildly decreased LVEF by  echo, given RX for SL NG prn.    I have reviewed the risks, indications, and alternatives to cardiac catheterization, possible angioplasty, and stenting with the patient today. Risks include but are not limited to bleeding, infection, vascular injury, stroke, myocardial infection, arrhythmia, kidney injury, radiation-related injury in the case of prolonged fluoroscopy use, emergency cardiac surgery, and death. The patient understands the risks of serious complication is 1-2 in 2876 with diagnostic cardiac cath and 1-2% or less with angioplasty/stenting.     Arnoldo Lenis, M.D.

## 2017-07-10 NOTE — H&P (View-Only) (Signed)
Clinical Summary Craig Mccann is a 66 y.o.male  seen as a focused follow up for chest pain and recent abnormal stress test.   1. Chest pain - CAD noted by prior CT chest - chest pain started several years ago. Pressure like pain midchest, can be 5-10/10 in severity. +SOB, just "feels weird all over" Can occur at rest or with activity - recalls episode in summer while out side working severe pain fell down to ground - pain lasts up to 5-10 minutes. Not positional. No relation to food - episodes every 2-3 weeks. Increase in frequency, longer in duration - heavies activity is yardwork. Increase DOE with activities. - he reports a remote cath several years in [place].  CAD risk factors: +tobacco, age   06/2017 nuclear stress: moderate mid inferoseptal and mid inferior defect with peri-infarct ischemia. LVEF 46% - 06/2017 echo LVEF 40-10%, grade I diastolic dysfunction  - reports episodes of chest pain since last vitis, can last up 20 minutes up to 1 hr. Occurred at rest. Sharp pain/pressure midchest, 6/10 in severity. Felt SOB, nauseous.     Past Medical History:  Diagnosis Date  . Acute prostatitis 01/2015  . Allergy   . Basal cell carcinoma 2017   Multiple: back, left shin, left arm  . CAP (community acquired pneumonia) 06/26/2015  . Chronic renal insufficiency, stage II (mild)    stage II/II: CrCl 60s.  . Complication of anesthesia   . COPD (chronic obstructive pulmonary disease) ([place]) 05/07/15   Long time smoker + COPD changes noted on lung ca screening CT  . Diverticulitis   . Encounter for screening for lung cancer    Screening CT ok 08/27/16--repeat in 1 yr recommended.  . Family history of colon cancer in mother   . Hearing impairment    hearing aids  . History of adenomatous polyp of colon 12/24/10  . Hyperlipidemia 07/14/2016   started atorv 20 07/16/16  . Kidney stones    bilat nonobstrucing renal calculi 2016, + left benign renal cysts  . Melanoma ([place])    R  posterolateral neck and R nasal ala.  Most recent was left mid back 01/2016 (Dr. Tarri Glenn).  [name] Spinal headache   . Tobacco dependence    CT chest for lung ca screning 04/2015 showed benign findings; 1 yr repeat recommended     Allergies  Allergen Reactions  . Codeine Hives  . Valium [Diazepam] Anaphylaxis     Current Outpatient Medications  Medication Sig Dispense Refill  . albuterol (PROAIR HFA) 108 (90 BASE) MCG/ACT inhaler Inhale 2 puffs into the lungs every 6 (six) hours as needed for wheezing or shortness of breath. 1 Inhaler 1  . aspirin EC 81 MG tablet Take 81 mg daily by mouth.    . tiotropium (SPIRIVA HANDIHALER) 18 MCG inhalation capsule Place 1 capsule (18 mcg total) into inhaler and inhale daily. 30 capsule 12   No current facility-administered medications for this visit.      Past Surgical History:  Procedure Laterality Date  . CHOLECYSTECTOMY  1987  . COLONOSCOPY  12/24/10; 2014   Multiple TCSs: pt on 5 yr recall, next due 2019.  . INGUINAL HERNIA REPAIR  1984  . JOINT REPLACEMENT     left  . KNEE SURGERY Left 2015   cartilage repair  . KNEE SURGERY Right 1997   "          "  . LITHOTRIPSY    . NECK SURGERY  1989  .  Thumb surgery Bilateral 2005   and 2006  . TOTAL KNEE ARTHROPLASTY Left 04/23/2016   Procedure: TOTAL KNEE ARTHROPLASTY;  Surgeon: Ninetta Lights, MD;  Location: [facility];  Service: Orthopedics;  Laterality: Left;     Allergies  Allergen Reactions  . Codeine Hives  . Valium [Diazepam] Anaphylaxis      Family History  Problem Relation Age of Onset  . Colon cancer Mother   . Cancer Mother   . Lung cancer Father   . Cancer Father   . Brain cancer Brother   . Cancer Brother      Social History Craig Mccann reports that he has been smoking cigarettes.  He started smoking about 48 years ago. He has a 20.00 pack-year smoking history. he has never used smokeless tobacco. Craig Mccann reports that he does not drink alcohol.   Review of  Systems CONSTITUTIONAL: No weight loss, fever, chills, weakness or fatigue.  HEENT: Eyes: No visual loss, blurred vision, double vision or yellow sclerae.No hearing loss, sneezing, congestion, runny nose or sore throat.  SKIN: No rash or itching.  CARDIOVASCULAR: per hpi RESPIRATORY: per hpi GASTROINTESTINAL: No anorexia, nausea, vomiting or diarrhea. No abdominal pain or blood.  GENITOURINARY: No burning on urination, no polyuria NEUROLOGICAL: No headache, dizziness, syncope, paralysis, ataxia, numbness or tingling in the extremities. No change in bowel or bladder control.  MUSCULOSKELETAL: No muscle, back pain, joint pain or stiffness.  LYMPHATICS: No enlarged nodes. No history of splenectomy.  PSYCHIATRIC: No history of depression or anxiety.  ENDOCRINOLOGIC: No reports of sweating, cold or heat intolerance. No polyuria or polydipsia.  [name]   Physical Examination Vitals:   07/10/17 0844  BP: 110/90  Pulse: 84  SpO2: 98%   Vitals:   07/10/17 0844  Weight: 253 lb (114.8 kg)  Height: 6\' 2"  (1.88 m)    Gen: resting comfortably, no acute distress HEENT: no scleral icterus, pupils equal round and reactive, no palptable cervical adenopathy,  CV: RRR, no m/r/g, no jvd Resp: Clear to auscultation bilaterally GI: abdomen is soft, non-tender, non-distended, normal bowel sounds, no hepatosplenomegaly MSK: extremities are warm, no edema.  Skin: warm, no rash Neuro:  no focal deficits Psych: appropriate affect   Diagnostic Studies  06/2017 nuclear stress  There was no ST segment deviation noted during stress.  Defect 1: There is a medium defect of moderate severity present in the mid inferoseptal and mid inferior location.  Findings consistent with prior myocardial infarction with peri-infarct ischemia. Variable soft tissue attenuation artifact is also contributing to defect.  This is a low to intermediate risk study.  Nuclear stress EF: 46%.  11.2018 echoStudy  Conclusions  - Left ventricle: The cavity size was normal. Wall thickness was   increased increased in a pattern of mild to moderate LVH.   Systolic function was mildly reduced. The estimated ejection   fraction was in the range of 45% to 50%. Doppler parameters are   consistent with abnormal left ventricular relaxation (grade 1   diastolic dysfunction). - Aortic valve: Valve area (VTI): 1.97 cm^2. Valve area (Vmax):   2.04 cm^2. Valve area (Vmean): 2 cm^2. - Technically adequate study.  Assessment and Plan   1. Chest pain/CAD - CAD risk factors with evidence of CAD by recent CT chest - recent abnormal stress test with ongoing symptoms.  - we will plan for a left heart cath to further evaluate - start Toprol XL 12.mg daily for antianginal and low normal to mildly decreased LVEF by  echo, given RX for SL NG prn.    I have reviewed the risks, indications, and alternatives to cardiac catheterization, possible angioplasty, and stenting with the patient today. Risks include but are not limited to bleeding, infection, vascular injury, stroke, myocardial infection, arrhythmia, kidney injury, radiation-related injury in the case of prolonged fluoroscopy use, emergency cardiac surgery, and death. The patient understands the risks of serious complication is 1-2 in 0929 with diagnostic cardiac cath and 1-2% or less with angioplasty/stenting.     Arnoldo Lenis, M.D.

## 2017-07-12 ENCOUNTER — Encounter: Payer: Self-pay | Admitting: Cardiology

## 2017-07-12 ENCOUNTER — Other Ambulatory Visit: Payer: Self-pay | Admitting: Cardiology

## 2017-07-12 DIAGNOSIS — R0789 Other chest pain: Secondary | ICD-10-CM

## 2017-07-13 DIAGNOSIS — R079 Chest pain, unspecified: Secondary | ICD-10-CM

## 2017-07-13 DIAGNOSIS — R9439 Abnormal result of other cardiovascular function study: Secondary | ICD-10-CM

## 2017-07-13 DIAGNOSIS — I209 Angina pectoris, unspecified: Secondary | ICD-10-CM

## 2017-07-14 ENCOUNTER — Encounter (HOSPITAL_COMMUNITY)
Admission: RE | Disposition: A | Discharge status: Home / Self Care | Payer: Self-pay | Source: Ambulatory Visit | Attending: Interventional Cardiology

## 2017-07-14 ENCOUNTER — Ambulatory Visit (HOSPITAL_COMMUNITY)
Admission: RE | Admit: 2017-07-14 | Discharge: 2017-07-14 | Disposition: A | Discharge status: Home / Self Care | Payer: Medicare Other | Source: Ambulatory Visit | Attending: Interventional Cardiology | Admitting: Interventional Cardiology

## 2017-07-14 DIAGNOSIS — Z85820 Personal history of malignant melanoma of skin: Secondary | ICD-10-CM | POA: Diagnosis not present

## 2017-07-14 DIAGNOSIS — F17210 Nicotine dependence, cigarettes, uncomplicated: Secondary | ICD-10-CM | POA: Diagnosis not present

## 2017-07-14 DIAGNOSIS — R0789 Other chest pain: Secondary | ICD-10-CM | POA: Insufficient documentation

## 2017-07-14 DIAGNOSIS — I5042 Chronic combined systolic (congestive) and diastolic (congestive) heart failure: Secondary | ICD-10-CM | POA: Diagnosis not present

## 2017-07-14 DIAGNOSIS — N182 Chronic kidney disease, stage 2 (mild): Secondary | ICD-10-CM | POA: Insufficient documentation

## 2017-07-14 DIAGNOSIS — I259 Chronic ischemic heart disease, unspecified: Secondary | ICD-10-CM | POA: Diagnosis not present

## 2017-07-14 DIAGNOSIS — Z79899 Other long term (current) drug therapy: Secondary | ICD-10-CM | POA: Insufficient documentation

## 2017-07-14 DIAGNOSIS — Z96652 Presence of left artificial knee joint: Secondary | ICD-10-CM | POA: Insufficient documentation

## 2017-07-14 DIAGNOSIS — E785 Hyperlipidemia, unspecified: Secondary | ICD-10-CM | POA: Insufficient documentation

## 2017-07-14 DIAGNOSIS — I2510 Atherosclerotic heart disease of native coronary artery without angina pectoris: Secondary | ICD-10-CM | POA: Diagnosis not present

## 2017-07-14 DIAGNOSIS — J449 Chronic obstructive pulmonary disease, unspecified: Secondary | ICD-10-CM | POA: Insufficient documentation

## 2017-07-14 DIAGNOSIS — R9439 Abnormal result of other cardiovascular function study: Secondary | ICD-10-CM | POA: Diagnosis not present

## 2017-07-14 DIAGNOSIS — I209 Angina pectoris, unspecified: Secondary | ICD-10-CM

## 2017-07-14 DIAGNOSIS — I700 Atherosclerosis of aorta: Secondary | ICD-10-CM | POA: Diagnosis present

## 2017-07-14 DIAGNOSIS — I25119 Atherosclerotic heart disease of native coronary artery with unspecified angina pectoris: Secondary | ICD-10-CM | POA: Diagnosis present

## 2017-07-14 DIAGNOSIS — Z7982 Long term (current) use of aspirin: Secondary | ICD-10-CM | POA: Diagnosis not present

## 2017-07-14 DIAGNOSIS — R079 Chest pain, unspecified: Secondary | ICD-10-CM

## 2017-07-14 HISTORY — PX: LEFT HEART CATH AND CORONARY ANGIOGRAPHY: CATH118249

## 2017-07-14 HISTORY — PX: CORONARY PRESSURE/FFR STUDY: CATH118243

## 2017-07-14 LAB — PROTIME-INR
INR: 1.02
Prothrombin Time: 13.3 seconds (ref 11.4–15.2)

## 2017-07-14 LAB — CBC
HEMATOCRIT: 42.4 % (ref 39.0–52.0)
HEMOGLOBIN: 14.3 g/dL (ref 13.0–17.0)
MCH: 30.2 pg (ref 26.0–34.0)
MCHC: 33.7 g/dL (ref 30.0–36.0)
MCV: 89.6 fL (ref 78.0–100.0)
Platelets: 228 10*3/uL (ref 150–400)
RBC: 4.73 MIL/uL (ref 4.22–5.81)
RDW: 13.4 % (ref 11.5–15.5)
WBC: 6.3 10*3/uL (ref 4.0–10.5)

## 2017-07-14 LAB — BASIC METABOLIC PANEL
ANION GAP: 6 (ref 5–15)
BUN: 17 mg/dL (ref 6–20)
CHLORIDE: 106 mmol/L (ref 101–111)
CO2: 25 mmol/L (ref 22–32)
Calcium: 9.1 mg/dL (ref 8.9–10.3)
Creatinine, Ser: 1.28 mg/dL — ABNORMAL HIGH (ref 0.61–1.24)
GFR calc non Af Amer: 56 mL/min — ABNORMAL LOW (ref >60)
GLUCOSE: 96 mg/dL (ref 65–99)
POTASSIUM: 4.1 mmol/L (ref 3.5–5.1)
Sodium: 137 mmol/L (ref 135–145)

## 2017-07-14 LAB — POCT ACTIVATED CLOTTING TIME: Activated Clotting Time: 246 seconds

## 2017-07-14 SURGERY — LEFT HEART CATH AND CORONARY ANGIOGRAPHY
Anesthesia: LOCAL

## 2017-07-14 MED ORDER — HEPARIN (PORCINE) IN NACL 2-0.9 UNIT/ML-% IJ SOLN
INTRAMUSCULAR | Status: AC
  Filled 2017-07-14: qty 1000

## 2017-07-14 MED ORDER — SODIUM CHLORIDE 0.9 % IV SOLN: 250.0000 mL | INTRAVENOUS | Status: DC | PRN

## 2017-07-14 MED ORDER — HEPARIN SODIUM (PORCINE) 1000 UNIT/ML IJ SOLN
INTRAMUSCULAR | Status: DC | PRN
  Administered 2017-07-14: 6000 [IU] via INTRAVENOUS
  Administered 2017-07-14: 2500 [IU] via INTRAVENOUS
  Administered 2017-07-14: 5000 [IU] via INTRAVENOUS

## 2017-07-14 MED ORDER — MIDAZOLAM HCL 2 MG/2ML IJ SOLN
INTRAMUSCULAR | Status: DC | PRN
  Administered 2017-07-14 (×2): 1 mg via INTRAVENOUS
  Administered 2017-07-14: 0.5 mg via INTRAVENOUS

## 2017-07-14 MED ORDER — MIDAZOLAM HCL 2 MG/2ML IJ SOLN
INTRAMUSCULAR | Status: AC
  Filled 2017-07-14: qty 2

## 2017-07-14 MED ORDER — SODIUM CHLORIDE 0.9 % WEIGHT BASED INFUSION: 1.0000 mL/kg/h | INTRAVENOUS | Status: DC

## 2017-07-14 MED ORDER — IOPAMIDOL (ISOVUE-370) INJECTION 76%
INTRAVENOUS | Status: DC | PRN
  Administered 2017-07-14: 150 mL via INTRA_ARTERIAL

## 2017-07-14 MED ORDER — SODIUM CHLORIDE 0.9% FLUSH: 3.0000 mL | Freq: Two times a day (BID) | INTRAVENOUS | Status: DC

## 2017-07-14 MED ORDER — ASPIRIN 81 MG PO CHEW: 81.0000 mg | CHEWABLE_TABLET | Freq: Every day | ORAL | Status: DC

## 2017-07-14 MED ORDER — ONDANSETRON HCL 4 MG/2ML IJ SOLN: 4.0000 mg | Freq: Four times a day (QID) | INTRAMUSCULAR | Status: DC | PRN

## 2017-07-14 MED ORDER — IOPAMIDOL (ISOVUE-370) INJECTION 76%
INTRAVENOUS | Status: AC
  Filled 2017-07-14: qty 100

## 2017-07-14 MED ORDER — VERAPAMIL HCL 2.5 MG/ML IV SOLN
INTRAVENOUS | Status: AC
  Filled 2017-07-14: qty 2

## 2017-07-14 MED ORDER — SODIUM CHLORIDE 0.9 % WEIGHT BASED INFUSION
3.0000 mL/kg/h | INTRAVENOUS | Status: AC
Start: 2017-07-14 — End: 2017-07-14
  Administered 2017-07-14: 3 mL/kg/h via INTRAVENOUS

## 2017-07-14 MED ORDER — ADENOSINE 12 MG/4ML IV SOLN
INTRAVENOUS | Status: AC
  Filled 2017-07-14: qty 16

## 2017-07-14 MED ORDER — ACETAMINOPHEN 325 MG PO TABS: 650.0000 mg | ORAL_TABLET | ORAL | Status: DC | PRN

## 2017-07-14 MED ORDER — LIDOCAINE HCL (PF) 1 % IJ SOLN
INTRAMUSCULAR | Status: AC
  Filled 2017-07-14: qty 30

## 2017-07-14 MED ORDER — ADENOSINE (DIAGNOSTIC) 140MCG/KG/MIN
INTRAVENOUS | Status: DC | PRN
  Administered 2017-07-14: 140 ug/kg/min via INTRAVENOUS

## 2017-07-14 MED ORDER — ADENOSINE 12 MG/4ML IV SOLN
INTRAVENOUS | Status: AC
  Filled 2017-07-14: qty 4

## 2017-07-14 MED ORDER — HEPARIN (PORCINE) IN NACL 2-0.9 UNIT/ML-% IJ SOLN
INTRAMUSCULAR | Status: AC | PRN
  Administered 2017-07-14: 1000 mL

## 2017-07-14 MED ORDER — ASPIRIN 81 MG PO CHEW
CHEWABLE_TABLET | ORAL | Status: AC
  Administered 2017-07-14: 81 mg via ORAL
  Filled 2017-07-14: qty 1

## 2017-07-14 MED ORDER — LIDOCAINE HCL (PF) 1 % IJ SOLN
INTRAMUSCULAR | Status: DC | PRN
  Administered 2017-07-14 (×2): 2 mL via SUBCUTANEOUS

## 2017-07-14 MED ORDER — IOPAMIDOL (ISOVUE-370) INJECTION 76%
INTRAVENOUS | Status: AC
Start: 2017-07-14 — End: ?
  Filled 2017-07-14: qty 50

## 2017-07-14 MED ORDER — HEPARIN SODIUM (PORCINE) 1000 UNIT/ML IJ SOLN
INTRAMUSCULAR | Status: AC
  Filled 2017-07-14: qty 1

## 2017-07-14 MED ORDER — SODIUM CHLORIDE 0.9% FLUSH: 3.0000 mL | INTRAVENOUS | Status: DC | PRN

## 2017-07-14 MED ORDER — FENTANYL CITRATE (PF) 100 MCG/2ML IJ SOLN
INTRAMUSCULAR | Status: DC | PRN
  Administered 2017-07-14 (×2): 25 ug via INTRAVENOUS
  Administered 2017-07-14: 50 ug via INTRAVENOUS

## 2017-07-14 MED ORDER — ASPIRIN 81 MG PO CHEW
81.0000 mg | CHEWABLE_TABLET | ORAL | Status: AC
  Administered 2017-07-14: 81 mg via ORAL

## 2017-07-14 MED ORDER — SODIUM CHLORIDE 0.9 % IV SOLN: INTRAVENOUS | Status: DC

## 2017-07-14 MED ORDER — FENTANYL CITRATE (PF) 100 MCG/2ML IJ SOLN
INTRAMUSCULAR | Status: AC
  Filled 2017-07-14: qty 2

## 2017-07-14 SURGICAL SUPPLY — 15 items
BAG SNAP BAND KOVER 36X36 (MISCELLANEOUS) ×1 IMPLANT
CATH INFINITI 5FR MULTPACK ANG (CATHETERS) ×1 IMPLANT
CATH VISTA GUIDE 6FR XB3.5 (CATHETERS) ×1 IMPLANT
COVER PRB 48X5XTLSCP FOLD TPE (BAG) IMPLANT
COVER PROBE 5X48 (BAG) ×2
DEVICE RAD COMP TR BAND LRG (VASCULAR PRODUCTS) ×2 IMPLANT
GLIDESHEATH SLEND A-KIT 6F 22G (SHEATH) ×1 IMPLANT
GUIDEWIRE INQWIRE 1.5J.035X260 (WIRE) IMPLANT
GUIDEWIRE PRESSURE COMET II (WIRE) ×1 IMPLANT
INQWIRE 1.5J .035X260CM (WIRE) ×2
KIT ESSENTIALS PG (KITS) ×1 IMPLANT
KIT HEART LEFT (KITS) ×2 IMPLANT
PACK CARDIAC CATHETERIZATION (CUSTOM PROCEDURE TRAY) ×2 IMPLANT
TRANSDUCER W/STOPCOCK (MISCELLANEOUS) ×2 IMPLANT
TUBING CIL FLEX 10 FLL-RA (TUBING) ×2 IMPLANT

## 2017-07-14 NOTE — Discharge Instructions (Signed)
**Note Senora Lacson-identified via Obfuscation** Radial Site Care °Refer to this sheet in the next few weeks. These instructions provide you with information about caring for yourself after your procedure. Your health care provider may also give you more specific instructions. Your treatment has been planned according to current medical practices, but problems sometimes occur. Call your health care provider if you have any problems or questions after your procedure. °What can I expect after the procedure? °After your procedure, it is typical to have the following: °· Bruising at the radial site that usually fades within 1-2 weeks. °· Blood collecting in the tissue (hematoma) that may be painful to the touch. It should usually decrease in size and tenderness within 1-2 weeks. ° °Follow these instructions at home: °· Take medicines only as directed by your health care provider. °· You may shower 24-48 hours after the procedure or as directed by your health care provider. Remove the bandage (dressing) and gently wash the site with plain soap and water. Pat the area dry with a clean towel. Do not rub the site, because this may cause bleeding. °· Do not take baths, swim, or use a hot tub until your health care provider approves. °· Check your insertion site every day for redness, swelling, or drainage. °· Do not apply powder or lotion to the site. °· Do not flex or bend the affected arm for 24 hours or as directed by your health care provider. °· Do not push or pull heavy objects with the affected arm for 24 hours or as directed by your health care provider. °· Do not lift over 10 lb (4.5 kg) for 5 days after your procedure or as directed by your health care provider. °· Ask your health care provider when it is okay to: °? Return to work or school. °? Resume usual physical activities or sports. °? Resume sexual activity. °· Do not drive home if you are discharged the same day as the procedure. Have someone else drive you. °· You may drive 24 hours after the procedure  unless otherwise instructed by your health care provider. °· Do not operate machinery or power tools for 24 hours after the procedure. °· If your procedure was done as an outpatient procedure, which means that you went home the same day as your procedure, a responsible adult should be with you for the first 24 hours after you arrive home. °· Keep all follow-up visits as directed by your health care provider. This is important. °Contact a health care provider if: °· You have a fever. °· You have chills. °· You have increased bleeding from the radial site. Hold pressure on the site. °Get help right away if: °· You have unusual pain at the radial site. °· You have redness, warmth, or swelling at the radial site. °· You have drainage (other than a small amount of blood on the dressing) from the radial site. °· The radial site is bleeding, and the bleeding does not stop after 30 minutes of holding steady pressure on the site. °· Your arm or hand becomes pale, cool, tingly, or numb. °This information is not intended to replace advice given to you by your health care provider. Make sure you discuss any questions you have with your health care provider. °Document Released: 08/23/2010 Document Revised: 12/27/2015 Document Reviewed: 02/06/2014 °Elsevier Interactive Patient Education © 2018 [employer] ° °

## 2017-07-14 NOTE — Research (Signed)
[place] Study Informed Consent   Subject Name: Craig Mccann  Subject met inclusion and exclusion criteria.  The informed consent form, study requirements and expectations were reviewed with the subject and questions and concerns were addressed prior to the signing of the consent form.  The subject verbalized understanding of the trial requirements.  The subject agreed to participate in the [employer] trial and signed the informed consent.  The informed consent was obtained prior to performance of any protocol-specific procedures for the subject.  A copy of the signed informed consent was given to the subject and a copy was placed in the subject's medical record. The subject will only be enrolled if the angiographic criteria is met.  Blossom Hoops 07/14/2017, 9:02 AM

## 2017-07-14 NOTE — Interval H&P Note (Signed)
Cath Lab Visit (complete for each Cath Lab visit)  Clinical Evaluation Leading to the Procedure:   ACS: No.  Non-ACS:    Anginal Classification: CCS III  Anti-ischemic medical therapy: Maximal Therapy (2 or more classes of medications)  Non-Invasive Test Results: Intermediate-risk stress test findings: cardiac mortality 1-3%/year  Prior CABG: No previous CABG      History and Physical Interval Note:  07/14/2017 10:13 AM  Craig Mccann  has presented today for surgery, with the diagnosis of cp, abnormal stress test  The various methods of treatment have been discussed with the patient and family. After consideration of risks, benefits and other options for treatment, the patient has consented to  Procedure(s): LEFT HEART CATH AND CORONARY ANGIOGRAPHY (N/A) as a surgical intervention .  The patient's history has been reviewed, patient examined, no change in status, stable for surgery.  I have reviewed the patient's chart and labs.  Questions were answered to the patient's satisfaction.     Belva Crome III

## 2017-07-15 ENCOUNTER — Encounter (HOSPITAL_COMMUNITY): Payer: Self-pay | Admitting: Interventional Cardiology

## 2017-07-21 DIAGNOSIS — Z85820 Personal history of malignant melanoma of skin: Secondary | ICD-10-CM | POA: Diagnosis not present

## 2017-07-21 DIAGNOSIS — D225 Melanocytic nevi of trunk: Secondary | ICD-10-CM | POA: Diagnosis not present

## 2017-07-21 DIAGNOSIS — B078 Other viral warts: Secondary | ICD-10-CM | POA: Diagnosis not present

## 2017-07-21 DIAGNOSIS — D485 Neoplasm of uncertain behavior of skin: Secondary | ICD-10-CM | POA: Diagnosis not present

## 2017-07-21 DIAGNOSIS — L570 Actinic keratosis: Secondary | ICD-10-CM | POA: Diagnosis not present

## 2017-08-09 NOTE — Progress Notes (Signed)
Cardiology Office Note    Date:  08/10/2017   ID:  Craig Mccann, DOB August 17, 1950, MRN [id number]  PCP:  Craig Everts, MD  Cardiologist: Dr. Harl Mccann   Chief Complaint  Patient presents with  . Follow-up    s/p cardiac catheterization    History of Present Illness:    Craig Mccann is a 66 y.o. male with past medical history of CAD (by prior CT), HTN, HLD, nonischemic cardiomyopathy (EF 45-50% by echo in 06/2017), COPD, and continued tobacco use who presents to the office today for 46-month follow-up.   He was last examined by Dr. Harl Mccann on 07/10/2017 and reported episodes of chest pain occurring at rest or with activity with associated dyspnea on exertion. A NST was obtained in 06/2017 and showed moderate mid inferoseptal and mid inferior defects with peri-infarct ischemia. Given his symptoms and abnormal NST, a cardiac catheterization was recommended for definitive evaluation. This was performed on 07/14/2017 and showed 20-30% plaque along RCA with mild luminal irregularities along the LAD. Was noted to have 50-60% stenosis along the LCx with a FFR of 0.94 not being hemodynamically significant. EF estimated at 40%. Aggressive medical management was recommended along with smoking cessation.   In talking with the patient today, he reports doing well since his recent cardiac catheterization. He has noted occasional episodes of chest discomfort which typically occur along his sternal region and only last for seconds at a time. No association with exertion. He denies any recent palpitations, dyspnea on exertion, orthopnea, PND, or lower extremity edema.  He reports good compliance with his medication regimen including ASA and Toprol-XL. He is not currently on statin therapy but is unaware of any prior intolerances.  He continues to smoke but has reduced his use to 1/3 ppd.     Past Medical History:  Diagnosis Date  . Acute prostatitis 01/2015  . Allergy   . Basal cell carcinoma  2017   Multiple: back, left shin, left arm  . CAD (coronary artery disease)    a. 07/2017: cath showing 50-60% stenosis along the LCx with a FFR of 0.94 not being hemodynamically significan  . CAP (community acquired pneumonia) 06/26/2015  . Chronic renal insufficiency, stage II (mild)    stage II/II: CrCl 60s.  . Complication of anesthesia   . COPD (chronic obstructive pulmonary disease) ([place]) 05/07/15   Long time smoker + COPD changes noted on lung ca screening CT  . Diverticulitis   . Encounter for screening for lung cancer    Screening CT ok 08/27/16--repeat in 1 yr recommended.  . Family history of colon cancer in mother   . Hearing impairment    hearing aids  . History of adenomatous polyp of colon 12/24/10  . Hyperlipidemia 07/14/2016   started atorv 20 07/16/16  . Kidney stones    bilat nonobstrucing renal calculi 2016, + left benign renal cysts  . Melanoma ([place])    R posterolateral neck and R nasal ala.  Most recent was left mid back 01/2016 (Dr. Tarri Glenn).  [name] Spinal headache   . Tobacco dependence    CT chest for lung ca screning 04/2015 showed benign findings; 1 yr repeat recommended    Past Surgical History:  Procedure Laterality Date  . CHOLECYSTECTOMY  1987  . COLONOSCOPY  12/24/10; 2014   Multiple TCSs: pt on 5 yr recall, next due 2019.  . INGUINAL HERNIA REPAIR  1984  . INTRAVASCULAR PRESSURE WIRE/FFR STUDY N/A 07/14/2017   Procedure: INTRAVASCULAR PRESSURE  WIRE/FFR STUDY;  Surgeon: Belva Crome, MD;  Location: [facility];  Service: Cardiovascular;  Laterality: N/A;  . JOINT REPLACEMENT     left  . KNEE SURGERY Left 2015   cartilage repair  . KNEE SURGERY Right 1997   "          "  . LEFT HEART CATH AND CORONARY ANGIOGRAPHY N/A 07/14/2017   Procedure: LEFT HEART CATH AND CORONARY ANGIOGRAPHY;  Surgeon: Belva Crome, MD;  Location: [facility];  Service: Cardiovascular;  Laterality: N/A;  . LITHOTRIPSY    . NECK SURGERY  1989  . Thumb surgery  Bilateral 2005   and 2006  . TOTAL KNEE ARTHROPLASTY Left 04/23/2016   Procedure: TOTAL KNEE ARTHROPLASTY;  Surgeon: Ninetta Lights, MD;  Location: [facility];  Service: Orthopedics;  Laterality: Left;    Current Medications: Outpatient Medications Prior to Visit  Medication Sig Dispense Refill  . albuterol (PROAIR HFA) 108 (90 BASE) MCG/ACT inhaler Inhale 2 puffs into the lungs every 6 (six) hours as needed for wheezing or shortness of breath. 1 Inhaler 1  . aspirin EC 81 MG tablet Take 81 mg daily by mouth.    . nitroGLYCERIN (NITROSTAT) 0.4 MG SL tablet Place 1 tablet (0.4 mg total) under the tongue every 5 (five) minutes as needed for chest pain. 25 tablet 3  . tiotropium (SPIRIVA HANDIHALER) 18 MCG inhalation capsule Place 1 capsule (18 mcg total) into inhaler and inhale daily. 30 capsule 12  . metoprolol succinate (TOPROL XL) 25 MG 24 hr tablet Take 0.5 tablets (12.5 mg total) by mouth daily. 45 tablet 1   No facility-administered medications prior to visit.      Allergies:   Codeine and Valium [diazepam]   Social History   Socioeconomic History  . Marital status: Married    Spouse name: None  . Number of children: None  . Years of education: None  . Highest education level: None  Social Needs  . Financial resource strain: None  . Food insecurity - worry: None  . Food insecurity - inability: None  . Transportation needs - medical: None  . Transportation needs - non-medical: None  Occupational History  . None  Tobacco Use  . Smoking status: Current Every Day Smoker    Packs/day: 0.50    Years: 40.00    Pack years: 20.00    Types: Cigarettes    Start date: 08/04/1968  . Smokeless tobacco: Never Used  . Tobacco comment: half pack a day  Substance and Sexual Activity  . Alcohol use: No    Alcohol/week: 0.0 oz  . Drug use: No  . Sexual activity: Yes  Other Topics Concern  . None  Social History Narrative   Married, 3 daughters.   Educ: HS + 2 yr technical school.    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   Relocated to [facility] 2016.   Tob: 50 pack-yr hx; cutting back as of 01/2015.   No alc or drugs.     Family History:  The patient's family history includes Brain cancer in his brother; Cancer in his brother, father, and mother; Colon cancer in his mother; Lung cancer in his father.   Review of Systems:   Please see the history of present illness.     General:  No chills, fever, night sweats or weight changes.  Cardiovascular:  No dyspnea on exertion, edema, orthopnea, palpitations, paroxysmal nocturnal dyspnea. Positive for chest pain (improved).  Dermatological: No  rash, lesions/masses Respiratory: No cough, dyspnea Urologic: No hematuria, dysuria Abdominal:   No nausea, vomiting, diarrhea, bright red blood per rectum, melena, or hematemesis Neurologic:  No visual changes, wkns, changes in mental status. All other systems reviewed and are otherwise negative except as noted above.   Physical Exam:    VS:  BP 112/72 (BP Location: Left Arm)   Pulse 85   Ht 6\' 2"  (1.88 m)   Wt 253 lb (114.8 kg)   SpO2 97%   BMI 32.48 kg/m    General: Well developed, well nourished Caucasian male appearing in no acute distress. Head: Normocephalic, atraumatic, sclera non-icteric, no xanthomas, nares are without discharge.  Neck: No carotid bruits. JVD not elevated.  Lungs: Respirations regular and unlabored, without wheezes or rales.  Heart: Regular rate and rhythm. No S3 or S4.  No murmur, no rubs, or gallops appreciated. Abdomen: Soft, non-tender, non-distended with normoactive bowel sounds. No hepatomegaly. No rebound/guarding. No obvious abdominal masses. Msk:  Strength and tone appear normal for age. No joint deformities or effusions. Extremities: No clubbing or cyanosis. No lower extremity edema.  Distal pedal pulses are 2+ bilaterally. Radial cath site is stable.  Neuro: Alert and oriented X 3. Moves all extremities spontaneously. No focal  deficits noted. Psych:  Responds to questions appropriately with a normal affect. Skin: No rashes or lesions noted  Wt Readings from Last 3 Encounters:  08/10/17 253 lb (114.8 kg)  07/14/17 252 lb (114.3 kg)  07/10/17 253 lb (114.8 kg)     Studies/Labs Reviewed:   EKG:  EKG is not ordered today.    Recent Labs: 05/22/2017: ALT 9 07/14/2017: BUN 17; Creatinine, Ser 1.28; Hemoglobin 14.3; Platelets 228; Potassium 4.1; Sodium 137   Lipid Panel    Component Value Date/Time   CHOL 193 05/22/2017 1041   TRIG 87 05/22/2017 1041   HDL 41 05/22/2017 1041   CHOLHDL 4.7 05/22/2017 1041   VLDL 36.6 08/13/2016 1126   LDLCALC 144 (H) 08/13/2016 1126   LDLDIRECT 132.0 07/14/2016 1045    Additional studies/ records that were reviewed today include:   Cardiac Catheterization: 07/14/2017  Widely patent coronary arteries.  Right dominant anatomy.  Luminal irregularities throughout the right coronary and up to 30% distal in the region of tortuosity.  Normal left main.  LAD is large and wraps around the left ventricular apex, containing only mild luminal irregularities.  Eccentric 50-60% stenosis in the mid circumflex but otherwise widely patent circumflex and branches.  FFR 0.94 across the mid circumflex lesion.  Widely patent ramus intermedius with luminal irregularities  Global left ventricular hypokinesis with EF in the 40% range.  Mildly elevated left ventricular end-diastolic pressure at 19 mmHg.  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  RECOMMENDATIONS:   Symptoms are atypical.  He does have eccentric disease in the mid circumflex.  It is not hemodynamically significant based upon an FFR of 0.94.  Nevertheless would recommend antiplatelet therapy, aggressive lipid-lowering, smoking cessation, and other risk modification as indicated.  Asymptomatic chronic combined systolic and diastolic heart failure needs guideline directed medical  therapy.  Assessment:    1. Coronary artery disease involving native coronary artery of native heart without angina pectoris   2. Nonischemic cardiomyopathy ([place])   3. Hyperlipidemia LDL goal <70   4. Tobacco abuse      Plan:   In order of problems listed above:  1. CAD - recent catheterization on 07/14/2017 showed 20-30% plaque along RCA with  mild luminal irregularities along the LAD. Was noted to have 50-60% stenosis along the LCx with a FFR of 0.94 not being hemodynamically significant.  Aggressive medical management was recommended along with smoking cessation.  - cath report was reviewed in detail with the patient. He reports having episodes of chest pain lasting only for seconds at a time which is not associated with exertion. Seems atypical for a cardiac etiology.  - will continue ASA and BB therapy. Titrate Toprol-XL to 25mg  daily. Will start Atorvastatin 20mg  daily as outlined below.   2. Nonischemic Cardiomyopathy - EF 45-50% by recent echo, at 40% by catheterization with no significant stenosis noted at that time.  - he has been on Toprol-XL 12.5mg  daily. Will titrate to 25mg  BID in the setting of his cardiomyopathy. Consider addition of ACE-I or ARB at his next visit if BP allows.   3. HLD - recent FLP in 05/2017 showed total cholesterol of 193, HDL 41, and LDL 133. Goal LDL is < 70 in the setting of known CAD.  - will start Atorvastatin 20mg  daily. Plan for repeat FLP and LFT's in 6-8 weeks.   4. Tobacco Use - he has reduced his use from 1 ppd to 1/3 ppd. Congratulated on this with complete cessation advised.     Medication Adjustments/Labs and Tests Ordered: Current medicines are reviewed at length with the patient today.  Concerns regarding medicines are outlined above.  Medication changes, Labs and Tests ordered today are listed in the Patient Instructions below. Patient Instructions  Medication Instructions:  START ATORVASTATIN 20 MG DAILY  INCREASE METOPROLOL  XL TO 25 MG DAILY   Labwork: 2 MONTHS  FASTING LIPIDS LFT'S   Testing/Procedures: NONE  Follow-Up: Your physician wants you to follow-up in: 6 MONTHS.  You will receive a reminder letter in the mail two months in advance. If you don't receive a letter, please call our office to schedule the follow-up appointment.  Any Other Special Instructions Will Be Listed Below (If Applicable).   If you need a refill on your cardiac medications before your next appointment, please call your pharmacy.    Signed, Erma Heritage, PA-C  08/10/2017 3:32 PM    [facility] [address] [place], [place] [address] Phone: [phone]

## 2017-08-10 ENCOUNTER — Ambulatory Visit (INDEPENDENT_AMBULATORY_CARE_PROVIDER_SITE_OTHER): Payer: Medicare Other | Admitting: Student

## 2017-08-10 ENCOUNTER — Encounter: Payer: Self-pay | Admitting: Student

## 2017-08-10 VITALS — BP 112/72 | HR 85 | Ht 74.0 in | Wt 253.0 lb

## 2017-08-10 DIAGNOSIS — I428 Other cardiomyopathies: Secondary | ICD-10-CM

## 2017-08-10 DIAGNOSIS — E785 Hyperlipidemia, unspecified: Secondary | ICD-10-CM

## 2017-08-10 DIAGNOSIS — I2510 Atherosclerotic heart disease of native coronary artery without angina pectoris: Secondary | ICD-10-CM

## 2017-08-10 DIAGNOSIS — Z720 Tobacco use: Secondary | ICD-10-CM | POA: Diagnosis not present

## 2017-08-10 MED ORDER — METOPROLOL SUCCINATE ER 25 MG PO TB24: 25.0000 mg | ORAL_TABLET | Freq: Every day | ORAL | 3 refills | Status: DC

## 2017-08-10 MED ORDER — ATORVASTATIN CALCIUM 20 MG PO TABS: 20.0000 mg | ORAL_TABLET | Freq: Every day | ORAL | 3 refills | Status: DC

## 2017-08-10 NOTE — Patient Instructions (Signed)
Medication Instructions:  START ATORVASTATIN 20 MG DAILY  INCREASE METOPROLOL XL TO 25 MG DAILY   Labwork: 2 MONTHS  FASTING LIPIDS LFT'S   Testing/Procedures: NONE  Follow-Up: Your physician wants you to follow-up in: 6 MONTHS.  You will receive a reminder letter in the mail two months in advance. If you don't receive a letter, please call our office to schedule the follow-up appointment.   Any Other Special Instructions Will Be Listed Below (If Applicable).     If you need a refill on your cardiac medications before your next appointment, please call your pharmacy.

## 2017-08-21 ENCOUNTER — Encounter: Payer: Self-pay | Admitting: Family Medicine

## 2017-08-21 ENCOUNTER — Ambulatory Visit (INDEPENDENT_AMBULATORY_CARE_PROVIDER_SITE_OTHER): Payer: Medicare Other | Admitting: Family Medicine

## 2017-08-21 ENCOUNTER — Other Ambulatory Visit: Payer: Self-pay

## 2017-08-21 VITALS — BP 108/68 | HR 80 | Temp 97.9°F | Resp 20 | Ht 74.0 in | Wt 255.1 lb

## 2017-08-21 DIAGNOSIS — E785 Hyperlipidemia, unspecified: Secondary | ICD-10-CM

## 2017-08-21 DIAGNOSIS — I700 Atherosclerosis of aorta: Secondary | ICD-10-CM | POA: Diagnosis not present

## 2017-08-21 DIAGNOSIS — Z800 Family history of malignant neoplasm of digestive organs: Secondary | ICD-10-CM

## 2017-08-21 DIAGNOSIS — J438 Other emphysema: Secondary | ICD-10-CM

## 2017-08-21 NOTE — Progress Notes (Signed)
Chief Complaint  Patient presents with  . Follow-up  Patient is here for follow-up. When I saw him for his initial visit we identified that he had chest pain.  He also had multiple risk factors for coronary artery disease.  He was referred to cardiology.  He had an abnormal stress test.  An echocardiogram that showed an ejection fraction of 40%.  He had a cardiac catheterization which showed multi vessels impaired, coronary artery disease.  He is treated with Lipitor.  He is compliant with medicine.  He has nitro that is not yet used.  He states that he feels well. I do have a question about whether he can have a colonoscopy.  He is overdue.  He has family history of colon cancer, and a history of colon polyps personally.  I will consult with cardiology regarding the appropriateness and timing of a colonoscopy. He is trying to quit smoking.  He is doing very well.  He states that he only smokes a couple cigarettes a day.  He promises he is going to quit by his birthday (3 weeks).  Congratulated him on this effort. He has COPD.  He is compliant with his inhalers.  He has not had any shortness of breath, cough, or recent exacerbation.  He has not needed albuterol since last seen.   Patient Active Problem List   Diagnosis Date Noted  . Chest pain 07/13/2017  . Abnormal nuclear stress test 07/13/2017  . Coronary atherosclerosis 05/22/2017  . Aortic atherosclerosis ([place]) 05/22/2017  . Tobacco abuse 05/22/2017  . 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 Family history of colon cancer in mother 05/22/2017  . Hyperlipidemia 07/14/2016  . 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 COPD (chronic obstructive pulmonary disease) ([place]) 11/08/2015    Outpatient Encounter Medications as of 08/21/2017  Medication Sig  . albuterol (PROAIR HFA) 108 (90 BASE) MCG/ACT inhaler Inhale 2 puffs into the lungs every 6 (six) hours as needed for wheezing or shortness of breath.  [name] aspirin EC 81 MG tablet Take 81 mg daily by  mouth.  [name] atorvastatin (LIPITOR) 20 MG tablet Take 1 tablet (20 mg total) by mouth daily.  . metoprolol succinate (TOPROL XL) 25 MG 24 hr tablet Take 1 tablet (25 mg total) by mouth daily.  . nitroGLYCERIN (NITROSTAT) 0.4 MG SL tablet Place 1 tablet (0.4 mg total) under the tongue every 5 (five) minutes as needed for chest pain.  [name] tiotropium (SPIRIVA HANDIHALER) 18 MCG inhalation capsule Place 1 capsule (18 mcg total) into inhaler and inhale daily.   No facility-administered encounter medications on file as of 08/21/2017.     Allergies  Allergen Reactions  . Codeine Hives  . Valium [Diazepam] Anaphylaxis    Review of Systems  Constitutional: Negative for activity change, appetite change and unexpected weight change.  HENT: Negative for congestion and dental problem.   Eyes: Negative for photophobia and visual disturbance.  Respiratory: Negative for cough and shortness of breath.   Cardiovascular: Negative for chest pain, palpitations and leg swelling.  Gastrointestinal: Negative for constipation and diarrhea.  Genitourinary: Negative for difficulty urinating and frequency.  Musculoskeletal: Negative for arthralgias and back pain.  Neurological: Negative for dizziness and headaches.  Psychiatric/Behavioral: Negative for sleep disturbance. The patient is not nervous/anxious.        Somewhat stressed caring for 45 and 65-year-old grandsons    BP 108/68 (BP Location: Left Arm, Patient Position: Sitting, Cuff Size: Large)   Pulse 80   Temp 97.9  F (36.6 C) (Temporal)   Resp 20   Ht 6\' 2"  (1.88 m)   Wt 255 lb 1.9 oz (115.7 kg)   SpO2 99%   BMI 32.76 kg/m   Physical Exam  Constitutional: He is oriented to person, place, and time. He appears well-developed and well-nourished.  Moderately overweight, protuberant abdomen  HENT:  Head: Normocephalic and atraumatic.  Mouth/Throat: Oropharynx is clear and moist.  Eyes: Conjunctivae are normal. Pupils are equal, round, and reactive to  light.  Neck: Normal range of motion. Neck supple. No thyromegaly present.  Cardiovascular: Normal rate, regular rhythm and normal heart sounds.  Pulmonary/Chest: Effort normal and breath sounds normal. No respiratory distress. He has no wheezes.  Abdominal: Soft. Bowel sounds are normal.  Musculoskeletal: Normal range of motion. He exhibits no edema.  Lymphadenopathy:    He has no cervical adenopathy.  Neurological: He is alert and oriented to person, place, and time.  Gait normal  Skin: Skin is warm and dry.  Psychiatric: He has a normal mood and affect. His behavior is normal. Thought content normal.  Nursing note and vitals reviewed.   ASSESSMENT/PLAN:  1. Aortic atherosclerosis ([facility]) And coronary artery disease.  On lipid.  Blood pressure is good.  Discussed exercise.  2. Other emphysema ([place]) Stable  3. Hyperlipidemia, unspecified hyperlipidemia type Taking Lipitor 20 mg a day.  4. Family history of colon cancer in mother Overdue for colonoscopy   Patient Instructions  I will call the cardiologist to see about the colon cancer screening continue your same medicine You are doing great I am glad you are trying to quit smoking Congratulations on your effort  See me in six months Call sooner for problems   Raylene Everts, MD

## 2017-08-21 NOTE — Patient Instructions (Signed)
I will call the cardiologist to see about the colon cancer screening continue your same medicine You are doing great I am glad you are trying to quit smoking Congratulations on your effort  See me in six months Call sooner for problems

## 2017-08-25 ENCOUNTER — Telehealth: Payer: Self-pay | Admitting: Family Medicine

## 2017-08-25 DIAGNOSIS — Z1211 Encounter for screening for malignant neoplasm of colon: Secondary | ICD-10-CM

## 2017-08-25 NOTE — Telephone Encounter (Signed)
-----   Message from Craig Everts, Mccann sent at 08/24/2017  6:06 PM EST ----- Please let Craig Mccann know that we can proceed with his colonoscopy.  I discussed with Craig Mccann.  Place a GI referral to [facility] gastroenterology for colonoscopy. ----- Message ----- From: Craig Lenis, Mccann Sent: 08/21/2017   2:20 PM To: Craig Everts, Mccann  Ok to proceed from our standpoint, if neccesary can hold aspirin if needed   Craig Mccann ----- Message ----- From: Craig Everts, Mccann Sent: 08/21/2017  12:59 PM To: Craig Lenis, Mccann  [name] has a family history of colon cancer in his brother.  He also has a history of colon polyps.  He is overdue for colonoscopy.  Do feel it is safe to proceed? Thanks, Craig Mccann

## 2017-08-25 NOTE — Telephone Encounter (Signed)
Patient informed of message below, verbalized understanding.  

## 2017-09-10 ENCOUNTER — Ambulatory Visit (INDEPENDENT_AMBULATORY_CARE_PROVIDER_SITE_OTHER): Payer: Self-pay

## 2017-09-10 DIAGNOSIS — Z86010 Personal history of colonic polyps: Secondary | ICD-10-CM

## 2017-09-10 MED ORDER — PEG-KCL-NACL-NASULF-NA ASC-C 100 G PO SOLR: 1.0000 | ORAL | 0 refills | Status: DC

## 2017-09-10 NOTE — Progress Notes (Signed)
Gastroenterology Pre-Procedure Review  Request Date:09/10/17 Requesting Physician: Dr.Nelson  PATIENT REVIEW QUESTIONS: The patient responded to the following health history questions as indicated:    1. Diabetes Melitis: no 2. Joint replacements in the past 12 months: no 3. Major health problems in the past 3 months: no 4. Has an artificial valve or MVP: no 5. Has a defibrillator: no 6. Has been advised in past to take antibiotics in advance of a procedure like teeth cleaning: no 7. Family history of colon cancer: yes (yes mother age 52)  10. Alcohol Use: no 9. History of sleep apnea: no  10. History of coronary artery or other vascular stents placed within the last 12 months: no 11. History of any prior anesthesia complications: no    MEDICATIONS & ALLERGIES:    Patient reports the following regarding taking any blood thinners:   Plavix? no Aspirin? yes (81mg ) Coumadin? no Brilinta? no Xarelto? no Eliquis? no Pradaxa? no Savaysa? no Effient? no  Patient confirms/reports the following medications:  Current Outpatient Medications  Medication Sig Dispense Refill  . albuterol (PROAIR HFA) 108 (90 BASE) MCG/ACT inhaler Inhale 2 puffs into the lungs every 6 (six) hours as needed for wheezing or shortness of breath. 1 Inhaler 1  . aspirin EC 81 MG tablet Take 81 mg daily by mouth.    [name] atorvastatin (LIPITOR) 20 MG tablet Take 1 tablet (20 mg total) by mouth daily. 90 tablet 3  . metoprolol succinate (TOPROL XL) 25 MG 24 hr tablet Take 1 tablet (25 mg total) by mouth daily. 90 tablet 3  . nitroGLYCERIN (NITROSTAT) 0.4 MG SL tablet Place 1 tablet (0.4 mg total) under the tongue every 5 (five) minutes as needed for chest pain. 25 tablet 3  . tiotropium (SPIRIVA HANDIHALER) 18 MCG inhalation capsule Place 1 capsule (18 mcg total) into inhaler and inhale daily. 30 capsule 12   No current facility-administered medications for this visit.     Patient confirms/reports the following  allergies:  Allergies  Allergen Reactions  . Codeine Hives  . Valium [Diazepam] Anaphylaxis    No orders of the defined types were placed in this encounter.   AUTHORIZATION INFORMATION Primary Insurance: Medicare,  ID #: [id number] Pre-Cert / Auth required: no   SCHEDULE INFORMATION: Procedure has been scheduled as follows:  Date: 10/09/17 Time: 1:30 Location: [facility] Dr[facility]  This Gastroenterology Pre-Precedure Review Form is being routed to the following provider(s): Neil Crouch PA-C

## 2017-09-10 NOTE — Patient Instructions (Signed)
SPLIT MOVIPREP INSTRUCTION SHEET  Please notify us immediately if you are diabetic, take iron supplements, or if you are on coumadin or any blood thinners.  Patient Name:  Craig Mccann Date of procedure:  10/09/17 Time to register at [facility] Stay:  12:30 Provider:  Dr. Lynn Ito will need to purchase 1 fleet enema     10/08/17  1 Day prior to procedure:     CLEAR LIQUIDS ALL DAY--NO SOLID FOODS!    At 5:00 PM Begin the prep as follows:     Empty 1 pouch A and 1 pouch B into disposable container.  Add lukewarm drinking water to the top line of the container.  Mix to dissolve.  You can mix solution ahead of time & refrigerate prior to drinking.  The solution should be used within 24 hours.  The container is divided by 4 marks.  Every 15 minutes, drink the solution down to the next mark (approx 8 oz) until the liter is complete.  Be sure to drink 16 ounces of clear liquid of your choice (this is important to make sure you stay hydrated and the prep works)   [place]  ([place]). You may take heart, blood pressure, or breathing  medications  10/08/17  Day of Procedure      Four hours before your procedure at 9:30 complete prep as follows:  Empty 1 pouch A and 1 pouch B into disposable container.  Add lukewarm drinking water to the top line of the container.  Mix to dissolve.  You can mix solution ahead of time & refrigerate prior to drinking.  The solution should be used within 24 hours.  The container is divided by 4 marks.  Every 15 minutes, drink the solution down to the next mark (approx 8 oz) until the liter is complete.  You must complete the entire prep to ensure the most effective cleaning.  Be sure to drink 16 ounces of clear liquid of your choice (this is important to make sure you stay hydrated and the prep works)  Should complete within 1 and 1/2 hours.   NOTHING TO EAT OR DRINK AFTER 11:30AM (2 hours  before your procedure)  Give yourself one Fleet enema about 1 hour prior to leaving for the hospital.  You may take TYLENOL products.  Please continue your regular medications unless we have instructed you otherwise.     Please note, on the day of your procedure you MUST be accompanied by an adult who is willing to assume responsibility for you at time of discharge. If you do not have such person with you, your procedure will have to be rescheduled.                                                                                                                     Please leave ALL jewelry at home prior to coming to the hospital for your procedure.   *  It is your responsibility to check with your insurance company for the benefits of coverage you have for this procedure. Unfortunately, not all insurance companies have benefits to cover all or part of these types of procedures. It is your responsibility to check your benefits, however we will be glad to assist you with any codes your insurance company may need.   Please note that most insurance companies will not cover a screening colonoscopy for people under the age of 50  For example, with some insurance companies you may have benefits for a screening colonoscopy, but if polyps are found the diagnosis will change and then you may have a deductible that will need to be met. Please make sure you check your benefits for screening colonoscopy as well as a diagnostic colonoscopy.   CLEAR LIQUIDS: (NO RED) Jello Apple Juice  White Grape Juice Water Banana popsicles  Kool-Aid  Coffee(No cream or milk) Tea (No cream or milk) Soft drinks Broth (fat free beef/chicken/vegetable)  Clear liquids allow you to see your fingers on the other side of the glass.  Be sure they are NOT RED in color, cloudy, but CLEAR.  Do Not Eat: Dairy products of any kind Cranberry juice Tomato or V8 Juice  Orange Juice Grapefruit Juice  Red Grape Juice Solid foods like  cereal, oatmeal, yogurt, fruits, vegetables, creamed soups, eggs, bread, etc   HELPFUL HINTS TO MAKE DRINKING EASIER: -Make sure prep is extremely COLD.  Refrigerate the night before.  You may also put in freezer. -You may try mixing Crystal Light or Country Time Lemonade if you prefer.  MIx in small amounts.  Add more if necessary. -Trying drinking through a straw. -Rinse mouth with water or mouthwash between glasses to remove aftertaste. -Try sipping on a cold beverage/ice popsicles between glasses of prep. -Place a piece of sugar-free hard candy in mouth between glasses. -If you become nauseated, try consuming smaller amounts or stretch out the time between glasses.  Stop for 30 minutes to an hour & slowly start back drinking.  Call our office with any questions or concerns at [phone].  Thank You   

## 2017-09-11 NOTE — Progress Notes (Signed)
OK to schedule

## 2017-09-22 ENCOUNTER — Ambulatory Visit (INDEPENDENT_AMBULATORY_CARE_PROVIDER_SITE_OTHER): Payer: Medicare Other | Admitting: Family Medicine

## 2017-09-22 ENCOUNTER — Encounter: Payer: Self-pay | Admitting: Family Medicine

## 2017-09-22 ENCOUNTER — Other Ambulatory Visit: Payer: Self-pay

## 2017-09-22 VITALS — BP 130/86 | HR 88 | Temp 98.1°F | Resp 20 | Ht 74.0 in | Wt 256.0 lb

## 2017-09-22 DIAGNOSIS — J101 Influenza due to other identified influenza virus with other respiratory manifestations: Secondary | ICD-10-CM

## 2017-09-22 DIAGNOSIS — R6889 Other general symptoms and signs: Secondary | ICD-10-CM | POA: Diagnosis not present

## 2017-09-22 LAB — POCT INFLUENZA A/B
INFLUENZA A, POC: POSITIVE — AB
Influenza B, POC: NEGATIVE

## 2017-09-22 MED ORDER — OSELTAMIVIR PHOSPHATE 75 MG PO CAPS: 75.0000 mg | ORAL_CAPSULE | Freq: Two times a day (BID) | ORAL | 0 refills | Status: DC

## 2017-09-22 NOTE — Patient Instructions (Addendum)
Tylenol or ibuprofen for fever and body aches Push fluids Call if worse Take the tamiflu twice a day for 5 days   Influenza, Adult Influenza, more commonly known as "the flu," is a viral infection that primarily affects the respiratory tract. The respiratory tract includes organs that help you breathe, such as the lungs, nose, and throat. The flu causes many common cold symptoms, as well as a high fever and body aches. The flu spreads easily from person to person (is contagious). Getting a flu shot (influenza vaccination) every year is the best way to prevent influenza. What are the causes? Influenza is caused by a virus. You can catch the virus by:  Breathing in droplets from an infected person's cough or sneeze.  Touching something that was recently contaminated with the virus and then touching your mouth, nose, or eyes.  What increases the risk? The following factors may make you more likely to get the flu:  Not cleaning your hands frequently with soap and water or alcohol-based hand sanitizer.  Having close contact with many people during cold and flu season.  Touching your mouth, eyes, or nose without washing or sanitizing your hands first.  Not drinking enough fluids or not eating a healthy diet.  Not getting enough sleep or exercise.  Being under a high amount of stress.  Not getting a yearly (annual) flu shot.  You may be at a higher risk of complications from the flu, such as a severe lung infection (pneumonia), if you:  Are over the age of 41.  Are pregnant.  Have a weakened disease-fighting system (immune system). You may have a weakened immune system if you: ? Have HIV or AIDS. ? Are undergoing chemotherapy. ? Aretaking medicines that reduce the activity of (suppress) the immune system.  Have a long-term (chronic) illness, such as heart disease, kidney disease, diabetes, or lung disease.  Have a liver disorder.  Are obese.  Have anemia.  What are the  signs or symptoms? Symptoms of this condition typically last 4-10 days and may include:  Fever.  Chills.  Headache, body aches, or muscle aches.  Sore throat.  Cough.  Runny or congested nose.  Chest discomfort and cough.  Poor appetite.  Weakness or tiredness (fatigue).  Dizziness.  Nausea or vomiting.  How is this diagnosed? This condition may be diagnosed based on your medical history and a physical exam. Your health care provider may do a nose or throat swab test to confirm the diagnosis. How is this treated? If influenza is detected early, you can be treated with antiviral medicine that can reduce the length of your illness and the severity of your symptoms. This medicine may be given by mouth (orally) or through an IV tube that is inserted in one of your veins. The goal of treatment is to relieve symptoms by taking care of yourself at home. This may include taking over-the-counter medicines, drinking plenty of fluids, and adding humidity to the air in your home. In some cases, influenza goes away on its own. Severe influenza or complications from influenza may be treated in a hospital. Follow these instructions at home:  Take over-the-counter and prescription medicines only as told by your health care provider.  Use a cool mist humidifier to add humidity to the air in your home. This can make breathing easier.  Rest as needed.  Drink enough fluid to keep your urine clear or pale yellow.  Cover your mouth and nose when you cough or sneeze.  Wash your hands with soap and water often, especially after you cough or sneeze. If soap and water are not available, use hand sanitizer.  Stay home from work or school as told by your health care provider. Unless you are visiting your health care provider, try to avoid leaving home until your fever has been gone for 24 hours without the use of medicine.  Keep all follow-up visits as told by your health care provider. This is  important. How is this prevented?  Getting an annual flu shot is the best way to avoid getting the flu. You may get the flu shot in late summer, fall, or winter. Ask your health care provider when you should get your flu shot.  Wash your hands often or use hand sanitizer often.  Avoid contact with people who are sick during cold and flu season.  Eat a healthy diet, drink plenty of fluids, get enough sleep, and exercise regularly. Contact a health care provider if:  You develop new symptoms.  You have: ? Chest pain. ? Diarrhea. ? A fever.  Your cough gets worse.  You produce more mucus.  You feel nauseous or you vomit. Get help right away if:  You develop shortness of breath or difficulty breathing.  Your skin or nails turn a bluish color.  You have severe pain or stiffness in your neck.  You develop a sudden headache or sudden pain in your face or ear.  You cannot stop vomiting. This information is not intended to replace advice given to you by your health care provider. Make sure you discuss any questions you have with your health care provider. Document Released: 07/18/2000 Document Revised: 12/27/2015 Document Reviewed: 05/15/2015 Elsevier Interactive Patient Education  2017 [employer].

## 2017-09-22 NOTE — Progress Notes (Signed)
Chief Complaint  Patient presents with  . URI    x 4 days   Patient states his been sick since the weekend.  Sudden onset of cough cold runny nose sore throat chills fever and body aches.  Cares for his grandsons who have recently been ill.  He declined a flu shot this season.  He is not having any coughing chest congestion or chest pain.  He does have underlying COPD.  Patient Active Problem List   Diagnosis Date Noted  . Chest pain 07/13/2017  . Abnormal nuclear stress test 07/13/2017  . Coronary atherosclerosis 05/22/2017  . Aortic atherosclerosis ([place]) 05/22/2017  . Tobacco abuse 05/22/2017  . 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 Family history of colon cancer in mother 05/22/2017  . Hyperlipidemia 07/14/2016  . 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 COPD (chronic obstructive pulmonary disease) ([place]) 11/08/2015    Outpatient Encounter Medications as of 09/22/2017  Medication Sig  . albuterol (PROAIR HFA) 108 (90 BASE) MCG/ACT inhaler Inhale 2 puffs into the lungs every 6 (six) hours as needed for wheezing or shortness of breath.  [name] aspirin EC 81 MG tablet Take 81 mg daily by mouth.  [name] atorvastatin (LIPITOR) 20 MG tablet Take 1 tablet (20 mg total) by mouth daily.  . metoprolol succinate (TOPROL XL) 25 MG 24 hr tablet Take 1 tablet (25 mg total) by mouth daily.  . nitroGLYCERIN (NITROSTAT) 0.4 MG SL tablet Place 1 tablet (0.4 mg total) under the tongue every 5 (five) minutes as needed for chest pain.  . peg 3350 powder (MOVIPREP) 100 g SOLR Take 1 kit (200 g total) by mouth as directed.  . tiotropium (SPIRIVA HANDIHALER) 18 MCG inhalation capsule Place 1 capsule (18 mcg total) into inhaler and inhale daily.  [name] oseltamivir (TAMIFLU) 75 MG capsule Take 1 capsule (75 mg total) by mouth 2 (two) times daily.   No facility-administered encounter medications on file as of 09/22/2017.     Allergies  Allergen Reactions  . Codeine Hives  . Valium [Diazepam] Anaphylaxis    Review of  Systems  Constitutional: Positive for activity change, appetite change, chills, fatigue and fever.  HENT: Positive for congestion, postnasal drip, rhinorrhea, sinus pressure and sore throat.   Eyes: Negative for redness and visual disturbance.  Respiratory: Negative for cough and shortness of breath.   Cardiovascular: Negative for chest pain and palpitations.  Gastrointestinal: Negative for diarrhea, nausea and vomiting.  Musculoskeletal: Positive for myalgias.  Neurological: Negative for facial asymmetry and headaches.  Psychiatric/Behavioral: Positive for sleep disturbance.     BP 130/86 (BP Location: Left Arm, Patient Position: Sitting, Cuff Size: Large)   Pulse 88   Temp 98.1 F (36.7 C) (Oral)   Resp 20   Ht _0  (1.88 m)   Wt 256 lb 0.6 oz (116.1 kg)   SpO2 100%   BMI 32.87 kg/m   Physical Exam  Constitutional: He is oriented to person, place, and time. He appears well-developed and well-nourished.  Appears moderately ill  HENT:  Head: Normocephalic and atraumatic.  Right Ear: External ear normal.  Left Ear: External ear normal.  Mouth/Throat: Oropharynx is clear and moist.  Posterior pharynx injected.  No sinus tenderness.  Nasal membranes swollen and erythematous.  Clear discharge  Eyes: EOM are normal. Pupils are equal, round, and reactive to light.  Mild conjunctival injection  Neck: Normal range of motion. No thyromegaly present.  Cardiovascular: Normal rate, regular rhythm and normal heart sounds.  Pulmonary/Chest: Effort  normal and breath sounds normal. He has no wheezes. He has no rales.  Musculoskeletal: Normal range of motion. He exhibits no edema.  Lymphadenopathy:    He has no cervical adenopathy.  Neurological: He is alert and oriented to person, place, and time.  Psychiatric: He has a normal mood and affect. His behavior is normal. Thought content normal.    ASSESSMENT/PLAN:  1. Flu-like symptoms  - POCT Influenza A/B  2. Influenza A Test  positive.  Discussed influenza.  Symptomatic care.  Call if worse.   Patient Instructions  Tylenol or ibuprofen for fever and body aches Push fluids Call if worse Take the tamiflu twice a day for 5 days   Influenza, Adult Influenza, more commonly known as "the flu," is a viral infection that primarily affects the respiratory tract. The respiratory tract includes organs that help you breathe, such as the lungs, nose, and throat. The flu causes many common cold symptoms, as well as a high fever and body aches. The flu spreads easily from person to person (is contagious). Getting a flu shot (influenza vaccination) every year is the best way to prevent influenza. What are the causes? Influenza is caused by a virus. You can catch the virus by:  Breathing in droplets from an infected person's cough or sneeze.  Touching something that was recently contaminated with the virus and then touching your mouth, nose, or eyes.  What increases the risk? The following factors may make you more likely to get the flu:  Not cleaning your hands frequently with soap and water or alcohol-based hand sanitizer.  Having close contact with many people during cold and flu season.  Touching your mouth, eyes, or nose without washing or sanitizing your hands first.  Not drinking enough fluids or not eating a healthy diet.  Not getting enough sleep or exercise.  Being under a high amount of stress.  Not getting a yearly (annual) flu shot.  You may be at a higher risk of complications from the flu, such as a severe lung infection (pneumonia), if you:  Are over the age of 27.  Are pregnant.  Have a weakened disease-fighting system (immune system). You may have a weakened immune system if you: ? Have HIV or AIDS. ? Are undergoing chemotherapy. ? Aretaking medicines that reduce the activity of (suppress) the immune system.  Have a long-term (chronic) illness, such as heart disease, kidney disease,  diabetes, or lung disease.  Have a liver disorder.  Are obese.  Have anemia.  What are the signs or symptoms? Symptoms of this condition typically last 4-10 days and may include:  Fever.  Chills.  Headache, body aches, or muscle aches.  Sore throat.  Cough.  Runny or congested nose.  Chest discomfort and cough.  Poor appetite.  Weakness or tiredness (fatigue).  Dizziness.  Nausea or vomiting.  How is this diagnosed? This condition may be diagnosed based on your medical history and a physical exam. Your health care provider may do a nose or throat swab test to confirm the diagnosis. How is this treated? If influenza is detected early, you can be treated with antiviral medicine that can reduce the length of your illness and the severity of your symptoms. This medicine may be given by mouth (orally) or through an IV tube that is inserted in one of your veins. The goal of treatment is to relieve symptoms by taking care of yourself at home. This may include taking over-the-counter medicines, drinking plenty of fluids, and  adding humidity to the air in your home. In some cases, influenza goes away on its own. Severe influenza or complications from influenza may be treated in a hospital. Follow these instructions at home:  Take over-the-counter and prescription medicines only as told by your health care provider.  Use a cool mist humidifier to add humidity to the air in your home. This can make breathing easier.  Rest as needed.  Drink enough fluid to keep your urine clear or pale yellow.  Cover your mouth and nose when you cough or sneeze.  Wash your hands with soap and water often, especially after you cough or sneeze. If soap and water are not available, use hand sanitizer.  Stay home from work or school as told by your health care provider. Unless you are visiting your health care provider, try to avoid leaving home until your fever has been gone for 24 hours without  the use of medicine.  Keep all follow-up visits as told by your health care provider. This is important. How is this prevented?  Getting an annual flu shot is the best way to avoid getting the flu. You may get the flu shot in late summer, fall, or winter. Ask your health care provider when you should get your flu shot.  Wash your hands often or use hand sanitizer often.  Avoid contact with people who are sick during cold and flu season.  Eat a healthy diet, drink plenty of fluids, get enough sleep, and exercise regularly. Contact a health care provider if:  You develop new symptoms.  You have: ? Chest pain. ? Diarrhea. ? A fever.  Your cough gets worse.  You produce more mucus.  You feel nauseous or you vomit. Get help right away if:  You develop shortness of breath or difficulty breathing.  Your skin or nails turn a bluish color.  You have severe pain or stiffness in your neck.  You develop a sudden headache or sudden pain in your face or ear.  You cannot stop vomiting. This information is not intended to replace advice given to you by your health care provider. Make sure you discuss any questions you have with your health care provider. Document Released: 07/18/2000 Document Revised: 12/27/2015 Document Reviewed: 05/15/2015 Elsevier Interactive Patient Education  2017 [employer]    Raylene Everts, MD

## 2017-10-09 ENCOUNTER — Ambulatory Visit (HOSPITAL_COMMUNITY)
Admission: RE | Admit: 2017-10-09 | Discharge: 2017-10-09 | Disposition: A | Discharge status: Home / Self Care | Payer: Medicare Other | Source: Ambulatory Visit | Attending: Gastroenterology | Admitting: Gastroenterology

## 2017-10-09 ENCOUNTER — Other Ambulatory Visit: Payer: Self-pay

## 2017-10-09 ENCOUNTER — Encounter (HOSPITAL_COMMUNITY)
Admission: RE | Disposition: A | Discharge status: Home / Self Care | Payer: Self-pay | Source: Ambulatory Visit | Attending: Gastroenterology

## 2017-10-09 ENCOUNTER — Encounter (HOSPITAL_COMMUNITY): Payer: Self-pay | Admitting: Gastroenterology

## 2017-10-09 DIAGNOSIS — J449 Chronic obstructive pulmonary disease, unspecified: Secondary | ICD-10-CM | POA: Insufficient documentation

## 2017-10-09 DIAGNOSIS — Z87442 Personal history of urinary calculi: Secondary | ICD-10-CM | POA: Diagnosis not present

## 2017-10-09 DIAGNOSIS — D123 Benign neoplasm of transverse colon: Secondary | ICD-10-CM

## 2017-10-09 DIAGNOSIS — Z885 Allergy status to narcotic agent status: Secondary | ICD-10-CM | POA: Insufficient documentation

## 2017-10-09 DIAGNOSIS — Z96652 Presence of left artificial knee joint: Secondary | ICD-10-CM | POA: Diagnosis not present

## 2017-10-09 DIAGNOSIS — I2510 Atherosclerotic heart disease of native coronary artery without angina pectoris: Secondary | ICD-10-CM | POA: Diagnosis not present

## 2017-10-09 DIAGNOSIS — D122 Benign neoplasm of ascending colon: Secondary | ICD-10-CM | POA: Diagnosis not present

## 2017-10-09 DIAGNOSIS — Z85820 Personal history of malignant melanoma of skin: Secondary | ICD-10-CM | POA: Diagnosis not present

## 2017-10-09 DIAGNOSIS — H9193 Unspecified hearing loss, bilateral: Secondary | ICD-10-CM | POA: Diagnosis not present

## 2017-10-09 DIAGNOSIS — Z7982 Long term (current) use of aspirin: Secondary | ICD-10-CM | POA: Diagnosis not present

## 2017-10-09 DIAGNOSIS — Z801 Family history of malignant neoplasm of trachea, bronchus and lung: Secondary | ICD-10-CM | POA: Diagnosis not present

## 2017-10-09 DIAGNOSIS — K644 Residual hemorrhoidal skin tags: Secondary | ICD-10-CM | POA: Insufficient documentation

## 2017-10-09 DIAGNOSIS — Z7951 Long term (current) use of inhaled steroids: Secondary | ICD-10-CM | POA: Insufficient documentation

## 2017-10-09 DIAGNOSIS — Z1211 Encounter for screening for malignant neoplasm of colon: Secondary | ICD-10-CM | POA: Diagnosis not present

## 2017-10-09 DIAGNOSIS — F17210 Nicotine dependence, cigarettes, uncomplicated: Secondary | ICD-10-CM | POA: Diagnosis not present

## 2017-10-09 DIAGNOSIS — K648 Other hemorrhoids: Secondary | ICD-10-CM | POA: Diagnosis not present

## 2017-10-09 DIAGNOSIS — K5730 Diverticulosis of large intestine without perforation or abscess without bleeding: Secondary | ICD-10-CM | POA: Diagnosis not present

## 2017-10-09 DIAGNOSIS — Z860100 Personal history of colon polyps, unspecified: Secondary | ICD-10-CM

## 2017-10-09 DIAGNOSIS — K621 Rectal polyp: Secondary | ICD-10-CM | POA: Diagnosis not present

## 2017-10-09 DIAGNOSIS — Z9049 Acquired absence of other specified parts of digestive tract: Secondary | ICD-10-CM | POA: Insufficient documentation

## 2017-10-09 DIAGNOSIS — E785 Hyperlipidemia, unspecified: Secondary | ICD-10-CM | POA: Diagnosis not present

## 2017-10-09 DIAGNOSIS — Z800 Family history of malignant neoplasm of digestive organs: Secondary | ICD-10-CM | POA: Diagnosis not present

## 2017-10-09 DIAGNOSIS — Z808 Family history of malignant neoplasm of other organs or systems: Secondary | ICD-10-CM | POA: Insufficient documentation

## 2017-10-09 DIAGNOSIS — N182 Chronic kidney disease, stage 2 (mild): Secondary | ICD-10-CM | POA: Diagnosis not present

## 2017-10-09 DIAGNOSIS — Z79899 Other long term (current) drug therapy: Secondary | ICD-10-CM | POA: Diagnosis not present

## 2017-10-09 HISTORY — PX: COLONOSCOPY: SHX5424

## 2017-10-09 SURGERY — COLONOSCOPY
Anesthesia: Moderate Sedation

## 2017-10-09 MED ORDER — MIDAZOLAM HCL 5 MG/5ML IJ SOLN
INTRAMUSCULAR | Status: DC | PRN
  Administered 2017-10-09 (×3): 2 mg via INTRAVENOUS

## 2017-10-09 MED ORDER — MEPERIDINE HCL 100 MG/ML IJ SOLN
INTRAMUSCULAR | Status: AC
  Filled 2017-10-09: qty 2

## 2017-10-09 MED ORDER — STERILE WATER FOR IRRIGATION IR SOLN
Status: DC | PRN
  Administered 2017-10-09: 15 mL

## 2017-10-09 MED ORDER — SODIUM CHLORIDE 0.9 % IV SOLN
INTRAVENOUS | Status: DC
  Administered 2017-10-09: 14:00:00 via INTRAVENOUS

## 2017-10-09 MED ORDER — MIDAZOLAM HCL 5 MG/5ML IJ SOLN
INTRAMUSCULAR | Status: AC
  Filled 2017-10-09: qty 10

## 2017-10-09 MED ORDER — MEPERIDINE HCL 100 MG/ML IJ SOLN
INTRAMUSCULAR | Status: DC | PRN
  Administered 2017-10-09: 25 mg via INTRAVENOUS
  Administered 2017-10-09: 50 mg via INTRAVENOUS
  Administered 2017-10-09: 25 mg via INTRAVENOUS

## 2017-10-09 NOTE — H&P (Signed)
Primary Care Physician:  Caren Macadam, MD Primary Gastroenterologist:  Dr. Oneida Alar  Pre-Procedure History & Physical: HPI:  Craig Mccann is a 67 y.o. male here for  PERSONAL HISTORY OF POLYPS.  Past Medical History:  Diagnosis Date  . Acute prostatitis 01/2015  . Allergy   . Basal cell carcinoma 2017   Multiple: back, left shin, left arm  . CAD (coronary artery disease)    a. 07/2017: cath showing 50-60% stenosis along the LCx with a FFR of 0.94 not being hemodynamically significant  . CAP (community acquired pneumonia) 06/26/2015  . Chronic renal insufficiency, stage II (mild)    stage II/II: CrCl 60s.  . Complication of anesthesia   . COPD (chronic obstructive pulmonary disease) ([place]) 05/07/15   Long time smoker + COPD changes noted on lung ca screening CT  . Diverticulitis   . Encounter for screening for lung cancer    Screening CT ok 08/27/16--repeat in 1 yr recommended.  . Family history of colon cancer in mother   . Hearing impairment    hearing aids  . History of adenomatous polyp of colon 12/24/10  . Hyperlipidemia 07/14/2016   started atorv 20 07/16/16  . Kidney stones    bilat nonobstrucing renal calculi 2016, + left benign renal cysts  . Melanoma ([place])    R posterolateral neck and R nasal ala.  Most recent was left mid back 01/2016 (Dr. Tarri Glenn).  [name] Spinal headache   . Tobacco dependence    CT chest for lung ca screning 04/2015 showed benign findings; 1 yr repeat recommended    Past Surgical History:  Procedure Laterality Date  . CHOLECYSTECTOMY  1987  . COLONOSCOPY  12/24/10; 2014   Multiple TCSs: pt on 5 yr recall, next due 2019.  . INGUINAL HERNIA REPAIR  1984  . INTRAVASCULAR PRESSURE WIRE/FFR STUDY N/A 07/14/2017   Procedure: INTRAVASCULAR PRESSURE WIRE/FFR STUDY;  Surgeon: Belva Crome, MD;  Location: [facility];  Service: Cardiovascular;  Laterality: N/A;  . JOINT REPLACEMENT     left  . KNEE SURGERY Left 2015   cartilage repair  . KNEE  SURGERY Right 1997   "          "  . LEFT HEART CATH AND CORONARY ANGIOGRAPHY N/A 07/14/2017   Procedure: LEFT HEART CATH AND CORONARY ANGIOGRAPHY;  Surgeon: Belva Crome, MD;  Location: [facility];  Service: Cardiovascular;  Laterality: N/A;  . LITHOTRIPSY    . NECK SURGERY  1989  . Thumb surgery Bilateral 2005   and 2006  . TOTAL KNEE ARTHROPLASTY Left 04/23/2016   Procedure: TOTAL KNEE ARTHROPLASTY;  Surgeon: Ninetta Lights, MD;  Location: [place];  Service: Orthopedics;  Laterality: Left;    Prior to Admission medications   Medication Sig Start Date End Date Taking? Authorizing Provider  albuterol (PROAIR HFA) 108 (90 BASE) MCG/ACT inhaler Inhale 2 puffs into the lungs every 6 (six) hours as needed for wheezing or shortness of breath. 06/26/15  Yes McGowen, Adrian Blackwater, MD  aspirin EC 81 MG tablet Take 81 mg daily by mouth.   Yes [provider]  atorvastatin (LIPITOR) 20 MG tablet Take 1 tablet (20 mg total) by mouth daily. 08/10/17 11/08/17 Yes Strader, Fransisco Hertz, PA-C  metoprolol succinate (TOPROL XL) 25 MG 24 hr tablet Take 1 tablet (25 mg total) by mouth daily. 08/10/17  Yes Strader, [place] M, PA-C  nitroGLYCERIN (NITROSTAT) 0.4 MG SL tablet Place 1 tablet (0.4 mg total)  under the tongue every 5 (five) minutes as needed for chest pain. 07/10/17 10/08/17 Yes BranchAlphonse Guild, MD  peg 3350 powder (MOVIPREP) 100 g SOLR Take 1 kit (200 g total) by mouth as directed. 09/10/17  Yes Mahala Menghini, PA-C  tiotropium (SPIRIVA HANDIHALER) 18 MCG inhalation capsule Place 1 capsule (18 mcg total) into inhaler and inhale daily. 09/10/15  Yes McGowen, Adrian Blackwater, MD    Allergies as of 09/10/2017 - Review Complete 09/10/2017  Allergen Reaction Noted  . Codeine Hives 01/31/2015  . Valium [diazepam] Anaphylaxis 01/31/2015    Family History  Problem Relation Age of Onset  . Colon cancer Mother   . Cancer Mother   . Lung cancer Father   . Cancer Father   . Brain cancer Brother   .  Cancer Brother     Social History   Socioeconomic History  . Marital status: Married    Spouse name: Not on file  . Number of children: Not on file  . Years of education: Not on file  . Highest education level: Not on file  Social Needs  . Financial resource strain: Not on file  . Food insecurity - worry: Not on file  . Food insecurity - inability: Not on file  . Transportation needs - medical: Not on file  . Transportation needs - non-medical: Not on file  Occupational History  . Not on file  Tobacco Use  . Smoking status: Current Every Day Smoker    Packs/day: 0.50    Years: 40.00    Pack years: 20.00    Types: Cigarettes    Start date: 08/04/1968  . Smokeless tobacco: Never Used  . Tobacco comment: half pack a day  Substance and Sexual Activity  . Alcohol use: No    Alcohol/week: 0.0 oz  . Drug use: No  . Sexual activity: Yes  Other Topics Concern  . Not on file  Social History Narrative   Married, 3 daughters.   Educ: HS + 2 yr technical school.   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   Relocated to [place] [facility] 2016.   Tob: 50 pack-yr hx; cutting back as of 01/2015.   No alc or drugs.    Review of Systems: See HPI, otherwise negative ROS   Physical Exam: BP 104/76   Pulse 80   Temp 98 F (36.7 C) (Oral)   Resp 18   Ht '6\' 2"'$  (1.88 m)   Wt 253 lb (114.8 kg)   SpO2 95%   BMI 32.48 kg/m  General:   Alert,  pleasant and cooperative in NAD Head:  Normocephalic and atraumatic. Neck:  Supple; Lungs:  Clear throughout to auscultation.    Heart:  Regular rate and rhythm. Abdomen:  Soft, nontender and nondistended. Normal bowel sounds, without guarding, and without rebound.   Neurologic:  Alert and  oriented x4;  grossly normal neurologically.  Impression/Plan:     PERSONAL HISTORY OF POLYPS.  PLAN: 1. TCS TODAY DISCUSSED PROCEDURE, BENEFITS, & RISKS: < 1% chance of medication reaction, bleeding, perforation, or rupture of spleen/liver.

## 2017-10-09 NOTE — Op Note (Signed)
[facility] Patient Name: Craig Mccann Procedure Date: 10/09/2017 2:20 PM MRN: [id number] Date of Birth: 1951/05/31 Attending MD: Barney Drain MD, MD CSN: [id number] Age: 67 Admit Type: Outpatient Procedure:                Colonoscopy WITH COLD SNARE CAUTERY POLYPECTOMY Indications:              Personal history of colonic polyps Providers:                Barney Drain MD, MD, Charlsie Quest. Theda Sers RN, RN,                            Nelma Rothman, Technician Referring MD:             Caren Macadam Medicines:                Meperidine 100 mg IV, Midazolam 6 mg IV Complications:            No immediate complications. Estimated Blood Loss:     Estimated blood loss was minimal. Procedure:                Pre-Anesthesia Assessment:                           - Prior to the procedure, a History and Physical                            was performed, and patient medications and                            allergies were reviewed. The patient's tolerance of                            previous anesthesia was also reviewed. The risks                            and benefits of the procedure and the sedation                            options and risks were discussed with the patient.                            All questions were answered, and informed consent                            was obtained. Prior Anticoagulants: The patient has                            taken aspirin, last dose was 7 days prior to                            procedure. ASA Grade Assessment: II - A patient                            with mild systemic disease. After reviewing the  risks and benefits, the patient was deemed in                            satisfactory condition to undergo the procedure.                            After obtaining informed consent, the colonoscope                            was passed under direct vision. Throughout the                            procedure, the patient's  blood pressure, pulse, and                            oxygen saturations were monitored continuously. The                            EC-[id number] ([id number]) scope was introduced through                            the anus and advanced to the the cecum, identified                            by appendiceal orifice and ileocecal valve. The                            colonoscopy was somewhat difficult due to a                            tortuous colon. Successful completion of the                            procedure was aided by straightening and shortening                            the scope to obtain bowel loop reduction and                            COLOWRAP. The patient tolerated the procedure                            fairly well. The quality of the bowel preparation                            was good. The ileocecal valve, appendiceal orifice,                            and rectum were photographed. Scope In: 3:14:27 PM Scope Out: 3:36:43 PM Scope Withdrawal Time: 0 hours 18 minutes 44 seconds  Total Procedure Duration: 0 hours 22 minutes 16 seconds  Findings:      Six sessile polyps were found in the rectum, hepatic flexure and       ascending  colon. The polyps were 3 to 6 mm in size. These polyps were       removed with a cold snare. Resection and retrieval were complete.      A 7 mm polyp was found in the hepatic flexure. The polyp was sessile.       The polyp was removed with a hot snare. Resection and retrieval were       complete.      Multiple small and large-mouthed diverticula were found in the       recto-sigmoid colon and sigmoid colon.      External and internal hemorrhoids were found during retroflexion. The       hemorrhoids were moderate. Impression:               - Six 3 to 6 mm polyps in the rectum, at the                            hepatic flexure and in the ascending colon, removed                            with a cold snare. Resected and retrieved.                            - One 7 mm polyp at the hepatic flexure, removed                            with a hot snare. Resected and retrieved.                           - Diverticulosis in the recto-sigmoid colon and in                            the sigmoid colon.                           - External and internal hemorrhoids. Moderate Sedation:      Moderate (conscious) sedation was administered by the endoscopy nurse       and supervised by the endoscopist. The following parameters were       monitored: oxygen saturation, heart rate, blood pressure, and response       to care. Total physician intraservice time was 36 minutes. Recommendation:           - Repeat colonoscopy in 3 - 5 years for                            surveillance.                           - High fiber diet.                           - Continue present medications.                           - Await pathology results.                           -  Patient has a contact number available for                            emergencies. The signs and symptoms of potential                            delayed complications were discussed with the                            patient. Return to normal activities tomorrow.                            Written discharge instructions were provided to the                            patient. Procedure Code(s):        --- Professional ---                           [phone], Colonoscopy, flexible; with removal of                            tumor(s), polyp(s), or other lesion(s) by snare                            technique                           99152, Moderate sedation services provided by the                            same physician or other qualified health care                            professional performing the diagnostic or                            therapeutic service that the sedation supports,                            requiring the presence of an independent trained                             observer to assist in the monitoring of the                            patient's level of consciousness and physiological                            status; initial 15 minutes of intraservice time,                            patient age 60 years or older  15400, Moderate sedation services; each additional                            15 minutes intraservice time Diagnosis Code(s):        --- Professional ---                           K62.1, Rectal polyp                           D12.3, Benign neoplasm of transverse colon (hepatic                            flexure or splenic flexure)                           D12.2, Benign neoplasm of ascending colon                           K64.8, Other hemorrhoids                           Z86.010, Personal history of colonic polyps                           K57.30, Diverticulosis of large intestine without                            perforation or abscess without bleeding CPT copyright 2016 American Medical Association. All rights reserved. The codes documented in this report are preliminary and upon coder review may  be revised to meet current compliance requirements. Barney Drain, MD Barney Drain MD, MD 10/09/2017 3:44:16 PM This report has been signed electronically. Number of Addenda: 0

## 2017-10-09 NOTE — Discharge Instructions (Signed)
You had 7 polyps removed. You have moderate internal hemorrhoids.   DRINK WATER TO KEEP YOUR URINE LIGHT YELLOW.  FOLLOW A HIGH FIBER DIET. AVOID ITEMS THAT CAUSE BLOATING & GAS. SEE INFO BELOW.  YOUR BIOPSY RESULTS WILL BE AVAILABLE IN 7 DAYS WITH YOUR RESULTS.   Next colonoscopy in 3-5 years.    Colonoscopy Care After Read the instructions outlined below and refer to this sheet in the next week. These discharge instructions provide you with general information on caring for yourself after you leave the hospital. While your treatment has been planned according to the most current medical practices available, unavoidable complications occasionally occur. If you have any problems or questions after discharge, call DR. Abdallah Hern, [phone].  ACTIVITY  You may resume your regular activity, but move at a slower pace for the next 24 hours.   Take frequent rest periods for the next 24 hours.   Walking will help get rid of the air and reduce the bloated feeling in your belly (abdomen).   No driving for 24 hours (because of the medicine (anesthesia) used during the test).   You may shower.   Do not sign any important legal documents or operate any machinery for 24 hours (because of the anesthesia used during the test).    NUTRITION  Drink plenty of fluids.   You may resume your normal diet as instructed by your doctor.   Begin with a light meal and progress to your normal diet. Heavy or fried foods are harder to digest and may make you feel sick to your stomach (nauseated).   Avoid alcoholic beverages for 24 hours or as instructed.    MEDICATIONS  You may resume your normal medications.   WHAT YOU CAN EXPECT TODAY  Some feelings of bloating in the abdomen.   Passage of more gas than usual.   Spotting of blood in your stool or on the toilet paper  .  IF YOU HAD POLYPS REMOVED DURING THE COLONOSCOPY:  Eat a soft diet IF YOU HAVE NAUSEA, BLOATING, ABDOMINAL PAIN, OR  VOMITING.    FINDING OUT THE RESULTS OF YOUR TEST Not all test results are available during your visit. DR. Oneida Alar WILL CALL YOU WITHIN 14 DAYS OF YOUR PROCEDUE WITH YOUR RESULTS. Do not assume everything is normal if you have not heard from DR. Harwood Nall, CALL HER OFFICE AT [phone].  SEEK IMMEDIATE MEDICAL ATTENTION AND CALL THE OFFICE: [phone] IF:  You have more than a spotting of blood in your stool.   Your belly is swollen (abdominal distention).   You are nauseated or vomiting.   You have a temperature over 101F.   You have abdominal pain or discomfort that is severe or gets worse throughout the day.   High-Fiber Diet A high-fiber diet changes your normal diet to include more whole grains, legumes, fruits, and vegetables. Changes in the diet involve replacing refined carbohydrates with unrefined foods. The calorie level of the diet is essentially unchanged. The Dietary Reference Intake (recommended amount) for adult males is 38 grams per day. For adult females, it is 25 grams per day. Pregnant and lactating women should consume 28 grams of fiber per day. Fiber is the intact part of a plant that is not broken down during digestion. Functional fiber is fiber that has been isolated from the plant to provide a beneficial effect in the body. PURPOSE  Increase stool bulk.   Ease and regulate bowel movements.   Lower cholesterol.   REDUCE RISK  OF COLON CANCER  INDICATIONS THAT YOU NEED MORE FIBER  Constipation and hemorrhoids.   Uncomplicated diverticulosis (intestine condition) and irritable bowel syndrome.   Weight management.   As a protective measure against hardening of the arteries (atherosclerosis), diabetes, and cancer.   GUIDELINES FOR INCREASING FIBER IN THE DIET  Start adding fiber to the diet slowly. A gradual increase of about 5 more grams (2 slices of whole-wheat bread, 2 servings of most fruits or vegetables, or 1 bowl of high-fiber cereal) per day is  best. Too rapid an increase in fiber may result in constipation, flatulence, and bloating.   Drink enough water and fluids to keep your urine clear or pale yellow. Water, juice, or caffeine-free drinks are recommended. Not drinking enough fluid may cause constipation.   Eat a variety of high-fiber foods rather than one type of fiber.   Try to increase your intake of fiber through using high-fiber foods rather than fiber pills or supplements that contain small amounts of fiber.   The goal is to change the types of food eaten. Do not supplement your present diet with high-fiber foods, but replace foods in your present diet.   INCLUDE A VARIETY OF FIBER SOURCES  Replace refined and processed grains with whole grains, canned fruits with fresh fruits, and incorporate other fiber sources. White rice, white breads, and most bakery goods contain little or no fiber.   Brown whole-grain rice, buckwheat oats, and many fruits and vegetables are all good sources of fiber. These include: broccoli, Brussels sprouts, cabbage, cauliflower, beets, sweet potatoes, white potatoes (skin on), carrots, tomatoes, eggplant, squash, berries, fresh fruits, and dried fruits.   Cereals appear to be the richest source of fiber. Cereal fiber is found in whole grains and bran. Bran is the fiber-rich outer coat of cereal grain, which is largely removed in refining. In whole-grain cereals, the bran remains. In breakfast cereals, the largest amount of fiber is found in those with "bran" in their names. The fiber content is sometimes indicated on the label.   You may need to include additional fruits and vegetables each day.   In baking, for 1 cup white flour, you may use the following substitutions:   1 cup whole-wheat flour minus 2 tablespoons.   1/2 cup white flour plus 1/2 cup whole-wheat flour.   Polyps, Colon  A polyp is extra tissue that grows inside your body. Colon polyps grow in the large intestine. The large  intestine, also called the colon, is part of your digestive system. It is a long, hollow tube at the end of your digestive tract where your body makes and stores stool. Most polyps are not dangerous. They are benign. This means they are not cancerous. But over time, some types of polyps can turn into cancer. Polyps that are smaller than a pea are usually not harmful. But larger polyps could someday become or may already be cancerous. To be safe, doctors remove all polyps and test them.   WHO GETS POLYPS? Anyone can get polyps, but certain people are more likely than others. You may have a greater chance of getting polyps if:  You are over 50.   You have had polyps before.   Someone in your family has had polyps.   Someone in your family has had cancer of the large intestine.   Find out if someone in your family has had polyps. You may also be more likely to get polyps if you:   Eat a lot of  fatty foods   Smoke   Drink alcohol   Do not exercise  Eat too much   PREVENTION There is not one sure way to prevent polyps. You might be able to lower your risk of getting them if you:  Eat more fruits and vegetables and less fatty food.   Do not smoke.   Avoid alcohol.   Exercise every day.   Lose weight if you are overweight.   Eating more calcium and folate can also lower your risk of getting polyps. Some foods that are rich in calcium are milk, cheese, and broccoli. Some foods that are rich in folate are chickpeas, kidney beans, and spinach.   Hemorrhoids Hemorrhoids are dilated (enlarged) veins around the rectum. Sometimes clots will form in the veins. This makes them swollen and painful. These are called thrombosed hemorrhoids. Causes of hemorrhoids include:  Constipation.   Straining to have a bowel movement.   HEAVY LIFTING  HOME CARE INSTRUCTIONS  Eat a well balanced diet and drink 6 to 8 glasses of water every day to avoid constipation. You may also use a bulk laxative.    Avoid straining to have bowel movements.   Keep anal area dry and clean.   Do not use a donut shaped pillow or sit on the toilet for long periods. This increases blood pooling and pain.   Move your bowels when your body has the urge; this will require less straining and will decrease pain and pressure.

## 2017-10-11 IMAGING — CR DG KNEE 1-2V PORT*L*
2 series · 2 of 2 positions shown · non-contrast
Comparison: None.

CLINICAL DATA: Post knee replacement

EXAM:
PORTABLE LEFT KNEE - 1-2 VIEW

[AP]
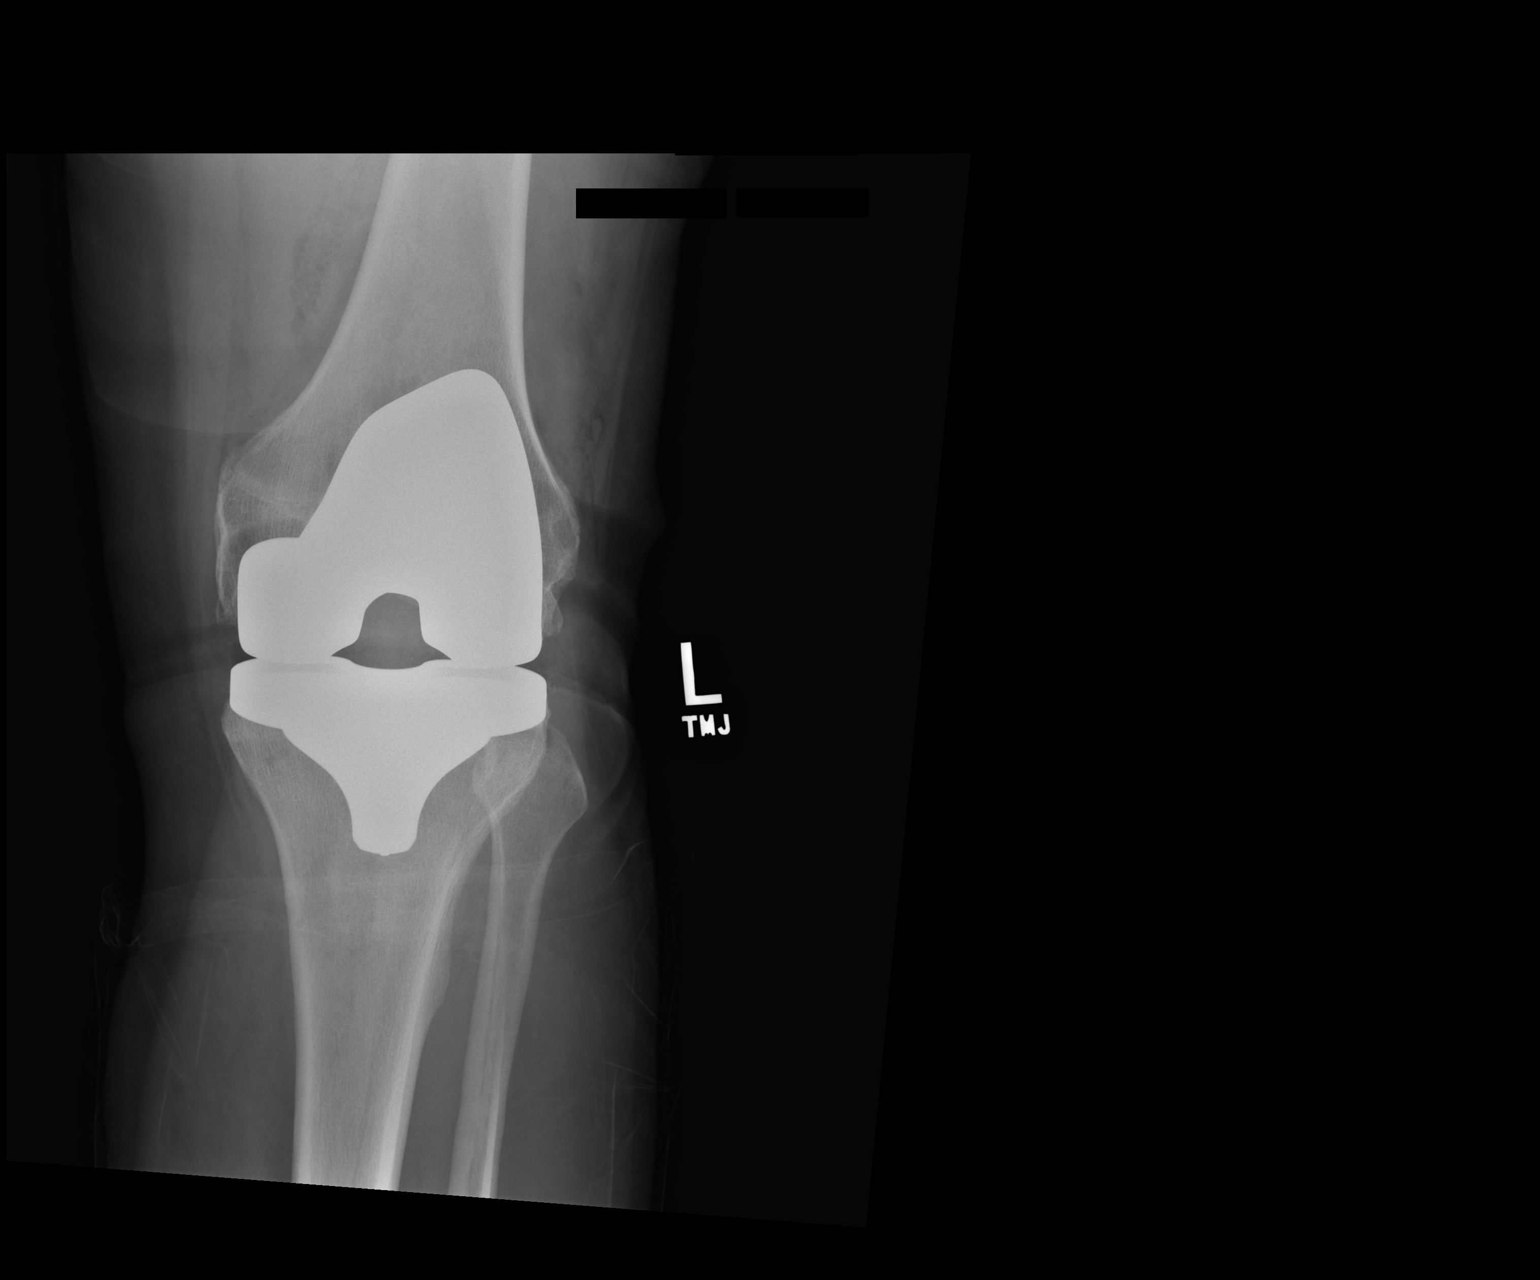

[xtable lateral]
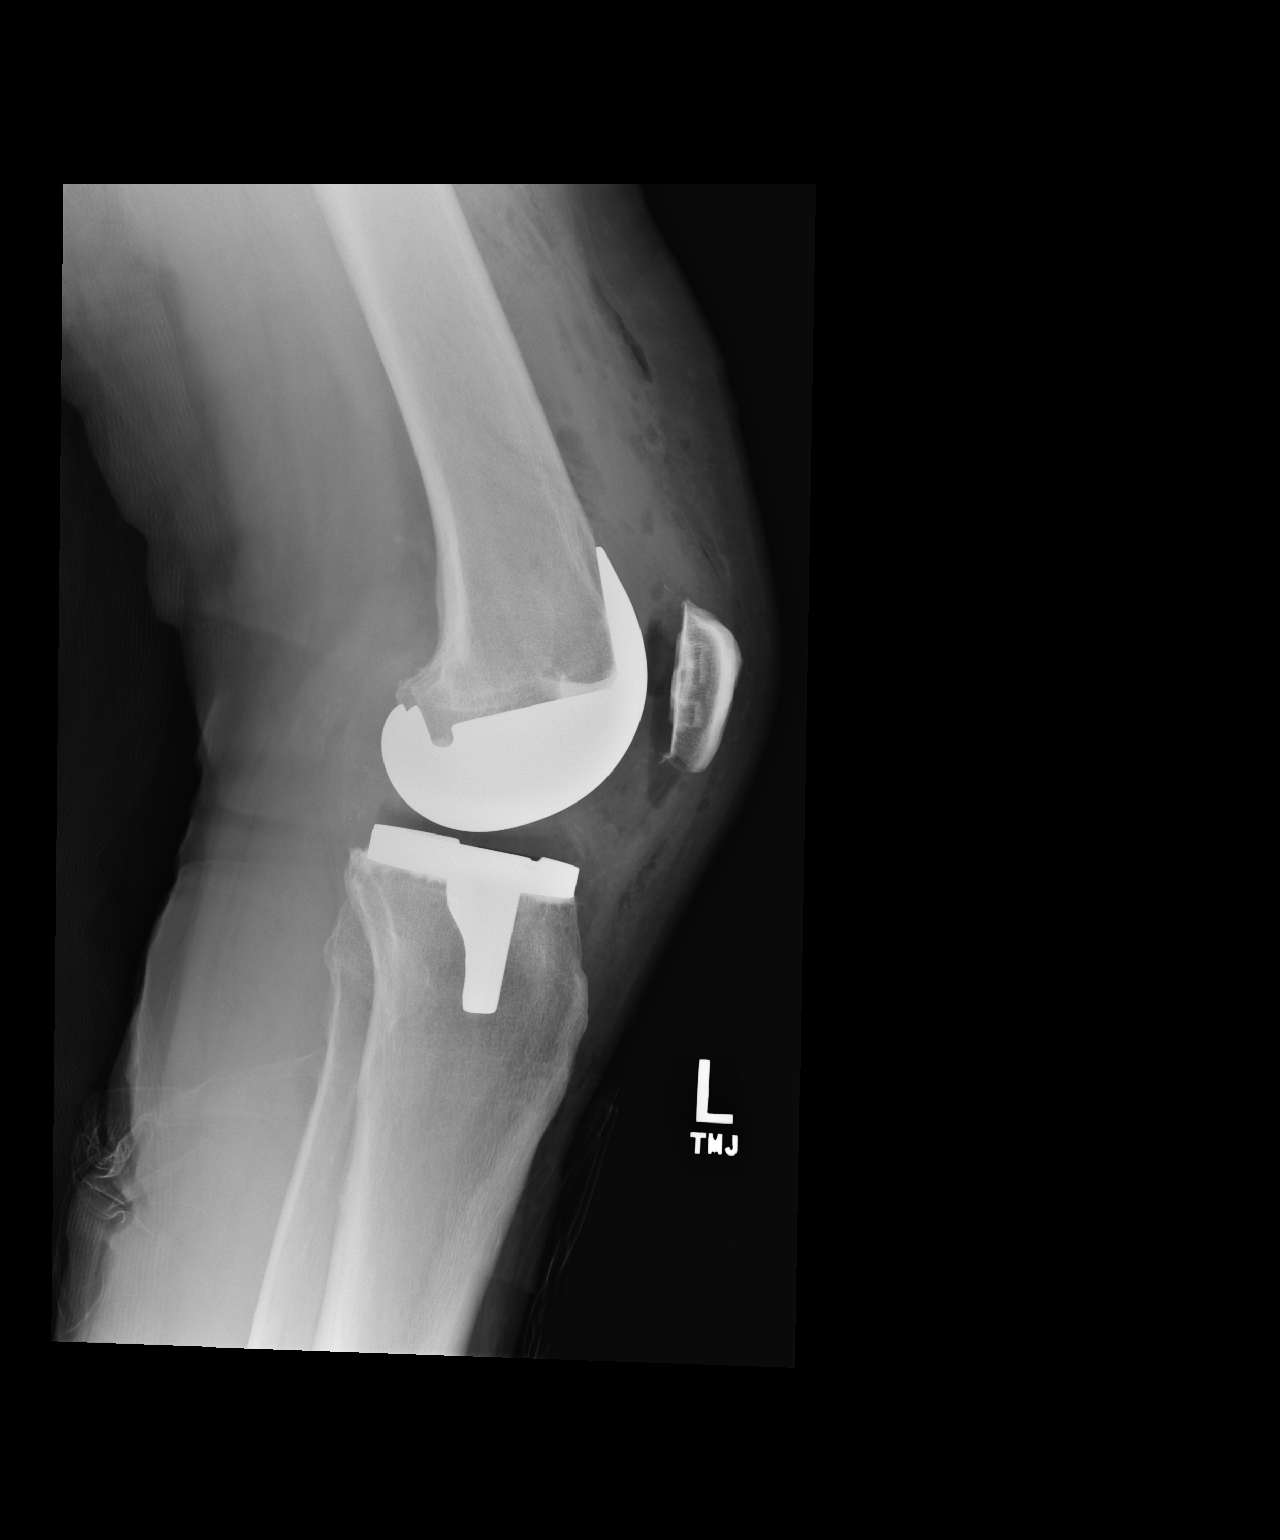

[2 of 2 positions shown; findings below may reference images not displayed]

FINDINGS: Two portable views of the left knee show the femoral and tibial
components of the left total knee replacement to be in good position
and alignment. No complicating features are seen. A small amount air
is noted in the soft tissues and joint space postoperatively.
IMPRESSION: Left total knee replacements in good position and alignment. No
complicating features.

## 2017-10-13 NOTE — Progress Notes (Signed)
PT is aware.

## 2017-10-16 ENCOUNTER — Encounter (HOSPITAL_COMMUNITY): Payer: Self-pay | Admitting: Gastroenterology

## 2018-01-07 ENCOUNTER — Encounter: Payer: Self-pay | Admitting: Family Medicine

## 2018-01-08 ENCOUNTER — Encounter: Payer: Self-pay | Admitting: Family Medicine

## 2018-01-19 DIAGNOSIS — L819 Disorder of pigmentation, unspecified: Secondary | ICD-10-CM | POA: Diagnosis not present

## 2018-01-19 DIAGNOSIS — Z85828 Personal history of other malignant neoplasm of skin: Secondary | ICD-10-CM | POA: Diagnosis not present

## 2018-01-19 DIAGNOSIS — L821 Other seborrheic keratosis: Secondary | ICD-10-CM | POA: Diagnosis not present

## 2018-01-19 DIAGNOSIS — D225 Melanocytic nevi of trunk: Secondary | ICD-10-CM | POA: Diagnosis not present

## 2018-01-19 DIAGNOSIS — L988 Other specified disorders of the skin and subcutaneous tissue: Secondary | ICD-10-CM | POA: Diagnosis not present

## 2018-01-19 DIAGNOSIS — D485 Neoplasm of uncertain behavior of skin: Secondary | ICD-10-CM | POA: Diagnosis not present

## 2018-01-19 DIAGNOSIS — L570 Actinic keratosis: Secondary | ICD-10-CM | POA: Diagnosis not present

## 2018-01-22 ENCOUNTER — Ambulatory Visit: Payer: Medicare Other | Admitting: Family Medicine

## 2018-02-07 ENCOUNTER — Encounter (HOSPITAL_COMMUNITY): Payer: Self-pay | Admitting: Emergency Medicine

## 2018-02-07 ENCOUNTER — Emergency Department (HOSPITAL_COMMUNITY)
Admission: EM | Admit: 2018-02-07 | Discharge: 2018-02-08 | Disposition: A | Discharge status: Home / Self Care | Payer: Medicare Other | Attending: Emergency Medicine | Admitting: Emergency Medicine

## 2018-02-07 DIAGNOSIS — N132 Hydronephrosis with renal and ureteral calculous obstruction: Secondary | ICD-10-CM | POA: Diagnosis not present

## 2018-02-07 DIAGNOSIS — N200 Calculus of kidney: Secondary | ICD-10-CM | POA: Diagnosis not present

## 2018-02-07 DIAGNOSIS — R109 Unspecified abdominal pain: Secondary | ICD-10-CM

## 2018-02-07 DIAGNOSIS — Z7982 Long term (current) use of aspirin: Secondary | ICD-10-CM | POA: Diagnosis not present

## 2018-02-07 DIAGNOSIS — Z79899 Other long term (current) drug therapy: Secondary | ICD-10-CM | POA: Insufficient documentation

## 2018-02-07 DIAGNOSIS — R1011 Right upper quadrant pain: Secondary | ICD-10-CM | POA: Insufficient documentation

## 2018-02-07 DIAGNOSIS — J449 Chronic obstructive pulmonary disease, unspecified: Secondary | ICD-10-CM | POA: Diagnosis not present

## 2018-02-07 DIAGNOSIS — Z96652 Presence of left artificial knee joint: Secondary | ICD-10-CM | POA: Insufficient documentation

## 2018-02-07 DIAGNOSIS — I2510 Atherosclerotic heart disease of native coronary artery without angina pectoris: Secondary | ICD-10-CM | POA: Insufficient documentation

## 2018-02-07 DIAGNOSIS — F17210 Nicotine dependence, cigarettes, uncomplicated: Secondary | ICD-10-CM | POA: Insufficient documentation

## 2018-02-07 LAB — URINALYSIS, ROUTINE W REFLEX MICROSCOPIC
BILIRUBIN URINE: NEGATIVE
GLUCOSE, UA: NEGATIVE mg/dL
HGB URINE DIPSTICK: NEGATIVE
KETONES UR: NEGATIVE mg/dL
LEUKOCYTES UA: NEGATIVE
Nitrite: NEGATIVE
PH: 6 (ref 5.0–8.0)
PROTEIN: NEGATIVE mg/dL
Specific Gravity, Urine: 1.016 (ref 1.005–1.030)

## 2018-02-07 LAB — COMPREHENSIVE METABOLIC PANEL
ALBUMIN: 4 g/dL (ref 3.5–5.0)
ALK PHOS: 51 U/L (ref 38–126)
ALT: 15 U/L (ref 0–44)
ANION GAP: 6 (ref 5–15)
AST: 14 U/L — ABNORMAL LOW (ref 15–41)
BILIRUBIN TOTAL: 0.5 mg/dL (ref 0.3–1.2)
BUN: 27 mg/dL — AB (ref 8–23)
CALCIUM: 9.1 mg/dL (ref 8.9–10.3)
CO2: 26 mmol/L (ref 22–32)
CREATININE: 1.52 mg/dL — AB (ref 0.61–1.24)
Chloride: 107 mmol/L (ref 98–111)
GFR calc Af Amer: 53 mL/min — ABNORMAL LOW (ref >60)
GFR calc non Af Amer: 45 mL/min — ABNORMAL LOW (ref >60)
GLUCOSE: 115 mg/dL — AB (ref 70–99)
Potassium: 4.2 mmol/L (ref 3.5–5.1)
SODIUM: 139 mmol/L (ref 135–145)
TOTAL PROTEIN: 7.1 g/dL (ref 6.5–8.1)

## 2018-02-07 LAB — CBC
HEMATOCRIT: 45.2 % (ref 39.0–52.0)
HEMOGLOBIN: 15.4 g/dL (ref 13.0–17.0)
MCH: 31.2 pg (ref 26.0–34.0)
MCHC: 34.1 g/dL (ref 30.0–36.0)
MCV: 91.5 fL (ref 78.0–100.0)
Platelets: 242 10*3/uL (ref 150–400)
RBC: 4.94 MIL/uL (ref 4.22–5.81)
RDW: 13.7 % (ref 11.5–15.5)
WBC: 8.1 10*3/uL (ref 4.0–10.5)

## 2018-02-07 NOTE — ED Triage Notes (Signed)
Hx of kidney stone, had one for 2 months, after skiing the weekend, he is voiding blood and right side pain in more intense.

## 2018-02-08 ENCOUNTER — Emergency Department (HOSPITAL_COMMUNITY): Payer: Medicare Other

## 2018-02-08 DIAGNOSIS — R1011 Right upper quadrant pain: Secondary | ICD-10-CM | POA: Diagnosis not present

## 2018-02-08 DIAGNOSIS — N132 Hydronephrosis with renal and ureteral calculous obstruction: Secondary | ICD-10-CM | POA: Diagnosis not present

## 2018-02-08 MED ORDER — PROMETHAZINE HCL 12.5 MG PO TABS
12.5000 mg | ORAL_TABLET | Freq: Once | ORAL | Status: AC
  Administered 2018-02-08: 12.5 mg via ORAL
  Filled 2018-02-08: qty 1

## 2018-02-08 MED ORDER — OXYCODONE-ACETAMINOPHEN 5-325 MG PO TABS
1.0000 | ORAL_TABLET | Freq: Once | ORAL | Status: AC
  Administered 2018-02-08: 1 via ORAL
  Filled 2018-02-08: qty 1

## 2018-02-08 MED ORDER — HYDROMORPHONE HCL 1 MG/ML IJ SOLN
0.5000 mg | Freq: Once | INTRAMUSCULAR | Status: AC
  Administered 2018-02-08: 0.5 mg via INTRAMUSCULAR
  Filled 2018-02-08: qty 1

## 2018-02-08 MED ORDER — MELOXICAM 7.5 MG PO TABS: 7.5000 mg | ORAL_TABLET | Freq: Every day | ORAL | 0 refills | Status: DC

## 2018-02-08 MED ORDER — KETOROLAC TROMETHAMINE 30 MG/ML IJ SOLN
30.0000 mg | Freq: Once | INTRAMUSCULAR | Status: AC
  Administered 2018-02-08: 30 mg via INTRAMUSCULAR
  Filled 2018-02-08: qty 1

## 2018-02-08 MED ORDER — OXYCODONE-ACETAMINOPHEN 5-325 MG PO TABS: 1.0000 | ORAL_TABLET | Freq: Four times a day (QID) | ORAL | 0 refills | Status: DC | PRN

## 2018-02-08 NOTE — Discharge Instructions (Addendum)
Your vital signs within normal limits.  Your blood work is within normal limits.  Your CT scan is suggested you  have stones within the kidney, but none in the ureter.  There is also some question of this pain being a musculoskeletal pain. Apply heat to the flank area.  Please monitor your bowels daily to evaluate for any colon problem, and monitor your urine for any blood in the urine.  Please follow-up with your primary physician Dr. Renato Battles.  Please return to  the ED if any excessive vomiting, pain worsening, or temperature elevations. Please use Percocet every 6 hours for severe pain. Use mobic daily with food

## 2018-02-08 NOTE — ED Provider Notes (Signed)
[facility] EMERGENCY DEPARTMENT Provider Note   CSN: [id number] Arrival date & time: 02/07/18  1934     History   Chief Complaint Chief Complaint  Patient presents with  . Flank Pain    HPI Craig Mccann is a 67 y.o. male.  Patient is a 67 year old male who presents to the emergency department with right flank area pain.  The patient states he has a history of kidney stones on the right and the left.  He was offered the opportunity to have some interventions done in the past, but he states that he chose not to have any interventions but to just do a wait-and-see.  Over the last 2 months he passed a kidney stone, this weekend he has been more active than usual, and says now that he is having right-sided pain but it feels like his previous kidney stone.  He says is hard to find a comfortable position, its accompanied by nausea, and it does not respond to rest or conservative measures.     Past Medical History:  Diagnosis Date  . Acute prostatitis 01/2015  . Allergy   . Basal cell carcinoma 2017   Multiple: back, left shin, left arm  . CAD (coronary artery disease)    a. 07/2017: cath showing 50-60% stenosis along the LCx with a FFR of 0.94 not being hemodynamically significant  . CAP (community acquired pneumonia) 06/26/2015  . Chronic renal insufficiency, stage II (mild)    stage II/II: CrCl 60s.  . Complication of anesthesia   . COPD (chronic obstructive pulmonary disease) ([place]) 05/07/15   Long time smoker + COPD changes noted on lung ca screening CT  . Diverticulitis   . Encounter for screening for lung cancer    Screening CT ok 08/27/16--repeat in 1 yr recommended.  . Family history of colon cancer in mother   . Hearing impairment    hearing aids  . History of adenomatous polyp of colon 12/24/10  . Hyperlipidemia 07/14/2016   started atorv 20 07/16/16  . Kidney stones    bilat nonobstrucing renal calculi 2016, + left benign renal cysts  . Melanoma ([place])    R  posterolateral neck and R nasal ala.  Most recent was left mid back 01/2016 (Dr. Tarri Glenn).  [name] Spinal headache   . Tobacco dependence    CT chest for lung ca screning 04/2015 showed benign findings; 1 yr repeat recommended    Patient Active Problem List   Diagnosis Date Noted  . Personal history of colonic polyps   . Chest pain 07/13/2017  . Abnormal nuclear stress test 07/13/2017  . Coronary atherosclerosis 05/22/2017  . Aortic atherosclerosis ([place]) 05/22/2017  . Tobacco abuse 05/22/2017  . 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 Family history of colon cancer in mother 05/22/2017  . Hyperlipidemia 07/14/2016  . 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 COPD (chronic obstructive pulmonary disease) ([place]) 11/08/2015    Past Surgical History:  Procedure Laterality Date  . CHOLECYSTECTOMY  1987  . COLONOSCOPY  12/24/10; 2014   Multiple TCSs: pt on 5 yr recall, next due 2019.  [name] COLONOSCOPY N/A 10/09/2017   Procedure: COLONOSCOPY;  Surgeon: Danie Binder, MD;  [facility];  Service: Endoscopy;  Laterality: N/A;  1:30  . INGUINAL HERNIA REPAIR  1984  . INTRAVASCULAR PRESSURE WIRE/FFR STUDY N/A 07/14/2017   Procedure: INTRAVASCULAR PRESSURE WIRE/FFR STUDY;  Surgeon: Belva Crome, MD;  Location: [place] CV [facility];  Service: Cardiovascular;  Laterality: N/A;  .  JOINT REPLACEMENT     left  . KNEE SURGERY Left 2015   cartilage repair  . KNEE SURGERY Right 1997   "          "  . LEFT HEART CATH AND CORONARY ANGIOGRAPHY N/A 07/14/2017   Procedure: LEFT HEART CATH AND CORONARY ANGIOGRAPHY;  Surgeon: Belva Crome, MD;  Location: [place] [facility] LAB;  Service: Cardiovascular;  Laterality: N/A;  . LITHOTRIPSY    . NECK SURGERY  1989  . Thumb surgery Bilateral 2005   and 2006  . TOTAL KNEE ARTHROPLASTY Left 04/23/2016   Procedure: TOTAL KNEE ARTHROPLASTY;  Surgeon: Ninetta Lights, MD;  Location: [place];  Service: Orthopedics;  Laterality: Left;        Home Medications    Prior to  Admission medications   Medication Sig Start Date End Date Taking? Authorizing Provider  albuterol (PROAIR HFA) 108 (90 BASE) MCG/ACT inhaler Inhale 2 puffs into the lungs every 6 (six) hours as needed for wheezing or shortness of breath. 06/26/15  Yes McGowen, Adrian Blackwater, MD  aspirin EC 81 MG tablet Take 81 mg daily by mouth.   Yes [provider]  atorvastatin (LIPITOR) 20 MG tablet Take 1 tablet (20 mg total) by mouth daily. 08/10/17 02/07/18 Yes Strader, Fransisco Hertz, PA-C  metoprolol succinate (TOPROL XL) 25 MG 24 hr tablet Take 1 tablet (25 mg total) by mouth daily. 08/10/17  Yes Strader, [place] M, PA-C  nitroGLYCERIN (NITROSTAT) 0.4 MG SL tablet Place 1 tablet (0.4 mg total) under the tongue every 5 (five) minutes as needed for chest pain. 07/10/17 02/07/18 Yes Branch, Alphonse Guild, MD    Family History Family History  Problem Relation Age of Onset  . Colon cancer Mother   . Cancer Mother   . Lung cancer Father   . Cancer Father   . Brain cancer Brother   . Cancer Brother     Social History Social History   Tobacco Use  . Smoking status: Current Every Day Smoker    Packs/day: 0.50    Years: 40.00    Pack years: 20.00    Types: Cigarettes    Start date: 08/04/1968  . Smokeless tobacco: Never Used  . Tobacco comment: half pack a day  Substance Use Topics  . Alcohol use: No    Alcohol/week: 0.0 oz  . Drug use: No     Allergies   Codeine and Valium [diazepam]   Review of Systems Review of Systems  Constitutional: Negative for activity change.       All ROS Neg except as noted in HPI  HENT: Negative for nosebleeds.   Eyes: Negative for photophobia and discharge.  Respiratory: Negative for cough, shortness of breath and wheezing.   Cardiovascular: Negative for chest pain and palpitations.  Gastrointestinal: Negative for abdominal pain and blood in stool.  Genitourinary: Positive for flank pain. Negative for dysuria, frequency and hematuria.  Musculoskeletal: Negative  for arthralgias, back pain and neck pain.  Skin: Negative.   Neurological: Negative for dizziness, seizures and speech difficulty.  Psychiatric/Behavioral: Negative for confusion and hallucinations.     Physical Exam Updated Vital Signs BP (!) 126/96   Pulse 96   Temp 98.4 F (36.9 C) (Oral)   Resp 20   Ht 6\' 2"  (1.88 m)   Wt 114.8 kg (253 lb)   SpO2 96%   BMI 32.48 kg/m   Physical Exam  Constitutional: He is oriented to person, place, and time. He appears  well-developed and well-nourished.  Non-toxic appearance.  HENT:  Head: Normocephalic.  Right Ear: Tympanic membrane and external ear normal.  Left Ear: Tympanic membrane and external ear normal.  Eyes: Pupils are equal, round, and reactive to light. EOM and lids are normal.  Neck: Normal range of motion. Neck supple. Carotid bruit is not present.  Cardiovascular: Normal rate, regular rhythm, normal heart sounds, intact distal pulses and normal pulses.  Pulmonary/Chest: Breath sounds normal. No respiratory distress.  Abdominal: Soft. Bowel sounds are normal. There is no tenderness. There is CVA tenderness. There is no guarding.  Right CVAT present  Musculoskeletal: Normal range of motion.  Lymphadenopathy:       Head (right side): No submandibular adenopathy present.       Head (left side): No submandibular adenopathy present.    He has no cervical adenopathy.  Neurological: He is alert and oriented to person, place, and time. He has normal strength. No cranial nerve deficit or sensory deficit.  Skin: Skin is warm and dry.  Psychiatric: He has a normal mood and affect. His speech is normal.  Nursing note and vitals reviewed.    ED Treatments / Results  Labs (all labs ordered are listed, but only abnormal results are displayed) Labs Reviewed  COMPREHENSIVE METABOLIC PANEL - Abnormal; Notable for the following components:      Result Value   Glucose, Bld 115 (*)    BUN 27 (*)    Creatinine, Ser 1.52 (*)    AST 14  (*)    GFR calc non Af Amer 45 (*)    GFR calc Af Amer 53 (*)    All other components within normal limits  CBC  URINALYSIS, ROUTINE W REFLEX MICROSCOPIC    EKG None  Radiology No results found.  Procedures Procedures (including critical care time)  Medications Ordered in ED Medications  HYDROmorphone (DILAUDID) injection 0.5 mg (0.5 mg Intramuscular Given 02/08/18 0047)  promethazine (PHENERGAN) tablet 12.5 mg (12.5 mg Oral Given 02/08/18 0047)  ketorolac (TORADOL) 30 MG/ML injection 30 mg (30 mg Intramuscular Given 02/08/18 0047)     Initial Impression / Assessment and Plan / ED Course  I have reviewed the triage vital signs and the nursing notes.  Pertinent labs & imaging results that were available during my care of the patient were reviewed by me and considered in my medical decision making (see chart for details).       Final Clinical Impressions(s) / ED Diagnoses MDM  Vital signs reviewed.  Patient treated for pain in the emergency department.  Complete blood count is well within normal limits.  Conference of metabolic panel shows the BUN to be elevated at 27, the creatinine elevated at 1.52, but these are not new findings for this patient.  The anion gap is 6, within normal limits.  Urine analysis is within normal limits.  Patient beginning to get some improvement from the pain.  CT stone study reveals bilateral nonobstructing renal calculi without hydronephrosis or obstructive uropathy.  There is a prominent fluid-filled small bowel with transition point suggesting possibly an ileus, but no obstruction.  There is also noted colonic diverticulosis, but no diverticulitis.  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  This is similar to the findings on the examination in 2016. Patient seen with me by Dr. Mariane Baumgarten.  No acute surgical findings at this time.  Vital signs remained stable at  discharge.  I discussed these changes with  the patient.  We discussed the possibility of this being a musculoskeletal injury.  I have asked the patient however to be seen by his GI specialist due to the findings that could possibly be consistent with ileus.  Patient is in agreement with this plan.   Final diagnoses:  Right flank pain    ED Discharge Orders        Ordered    meloxicam (MOBIC) 7.5 MG tablet  Daily     02/08/18 0153    oxyCODONE-acetaminophen (PERCOCET/ROXICET) 5-325 MG tablet  Every 6 hours PRN     02/08/18 0153       Lily Kocher, PA-C 02/10/18 1138    Pollina, Gwenyth Allegra, MD 02/11/18 [id number]

## 2018-02-10 DIAGNOSIS — Z6832 Body mass index (BMI) 32.0-32.9, adult: Secondary | ICD-10-CM | POA: Diagnosis not present

## 2018-02-10 DIAGNOSIS — N200 Calculus of kidney: Secondary | ICD-10-CM | POA: Diagnosis not present

## 2018-02-10 DIAGNOSIS — J441 Chronic obstructive pulmonary disease with (acute) exacerbation: Secondary | ICD-10-CM | POA: Diagnosis not present

## 2018-02-10 DIAGNOSIS — I10 Essential (primary) hypertension: Secondary | ICD-10-CM | POA: Diagnosis not present

## 2018-02-10 DIAGNOSIS — I700 Atherosclerosis of aorta: Secondary | ICD-10-CM | POA: Diagnosis not present

## 2018-02-10 DIAGNOSIS — K5730 Diverticulosis of large intestine without perforation or abscess without bleeding: Secondary | ICD-10-CM | POA: Diagnosis not present

## 2018-02-10 DIAGNOSIS — E782 Mixed hyperlipidemia: Secondary | ICD-10-CM | POA: Diagnosis not present

## 2018-02-18 DIAGNOSIS — D485 Neoplasm of uncertain behavior of skin: Secondary | ICD-10-CM | POA: Diagnosis not present

## 2018-02-18 DIAGNOSIS — L988 Other specified disorders of the skin and subcutaneous tissue: Secondary | ICD-10-CM | POA: Diagnosis not present

## 2018-02-25 DIAGNOSIS — I10 Essential (primary) hypertension: Secondary | ICD-10-CM | POA: Diagnosis not present

## 2018-02-25 DIAGNOSIS — E782 Mixed hyperlipidemia: Secondary | ICD-10-CM | POA: Diagnosis not present

## 2018-02-25 DIAGNOSIS — Z0000 Encounter for general adult medical examination without abnormal findings: Secondary | ICD-10-CM | POA: Diagnosis not present

## 2018-02-25 DIAGNOSIS — Z131 Encounter for screening for diabetes mellitus: Secondary | ICD-10-CM | POA: Diagnosis not present

## 2018-02-25 DIAGNOSIS — Z125 Encounter for screening for malignant neoplasm of prostate: Secondary | ICD-10-CM | POA: Diagnosis not present

## 2018-03-04 DIAGNOSIS — I10 Essential (primary) hypertension: Secondary | ICD-10-CM | POA: Diagnosis not present

## 2018-03-04 DIAGNOSIS — I2510 Atherosclerotic heart disease of native coronary artery without angina pectoris: Secondary | ICD-10-CM | POA: Diagnosis not present

## 2018-03-04 DIAGNOSIS — N200 Calculus of kidney: Secondary | ICD-10-CM | POA: Diagnosis not present

## 2018-03-04 DIAGNOSIS — J449 Chronic obstructive pulmonary disease, unspecified: Secondary | ICD-10-CM | POA: Diagnosis not present

## 2018-03-04 DIAGNOSIS — R944 Abnormal results of kidney function studies: Secondary | ICD-10-CM | POA: Diagnosis not present

## 2018-03-04 DIAGNOSIS — I712 Thoracic aortic aneurysm, without rupture: Secondary | ICD-10-CM | POA: Diagnosis not present

## 2018-03-04 DIAGNOSIS — Z0000 Encounter for general adult medical examination without abnormal findings: Secondary | ICD-10-CM | POA: Diagnosis not present

## 2018-03-04 DIAGNOSIS — E782 Mixed hyperlipidemia: Secondary | ICD-10-CM | POA: Diagnosis not present

## 2018-03-04 DIAGNOSIS — Z4802 Encounter for removal of sutures: Secondary | ICD-10-CM | POA: Diagnosis not present

## 2018-04-06 DIAGNOSIS — Z6832 Body mass index (BMI) 32.0-32.9, adult: Secondary | ICD-10-CM | POA: Diagnosis not present

## 2018-04-06 DIAGNOSIS — M545 Low back pain: Secondary | ICD-10-CM | POA: Diagnosis not present

## 2018-04-06 DIAGNOSIS — M62830 Muscle spasm of back: Secondary | ICD-10-CM | POA: Diagnosis not present

## 2018-04-23 ENCOUNTER — Other Ambulatory Visit (HOSPITAL_COMMUNITY): Payer: Self-pay | Admitting: Internal Medicine

## 2018-04-23 ENCOUNTER — Ambulatory Visit (HOSPITAL_COMMUNITY)
Admission: RE | Admit: 2018-04-23 | Discharge: 2018-04-23 | Disposition: A | Discharge status: Home / Self Care | Payer: Medicare Other | Source: Ambulatory Visit | Attending: Internal Medicine | Admitting: Internal Medicine

## 2018-04-23 DIAGNOSIS — M47816 Spondylosis without myelopathy or radiculopathy, lumbar region: Secondary | ICD-10-CM | POA: Diagnosis not present

## 2018-04-23 DIAGNOSIS — M5106 Intervertebral disc disorders with myelopathy, lumbar region: Secondary | ICD-10-CM | POA: Diagnosis not present

## 2018-04-23 DIAGNOSIS — M7989 Other specified soft tissue disorders: Secondary | ICD-10-CM | POA: Insufficient documentation

## 2018-04-23 DIAGNOSIS — M5137 Other intervertebral disc degeneration, lumbosacral region: Secondary | ICD-10-CM | POA: Insufficient documentation

## 2018-04-23 DIAGNOSIS — M47814 Spondylosis without myelopathy or radiculopathy, thoracic region: Secondary | ICD-10-CM | POA: Diagnosis not present

## 2018-04-23 DIAGNOSIS — D1809 Hemangioma of other sites: Secondary | ICD-10-CM

## 2018-04-23 DIAGNOSIS — M545 Low back pain: Secondary | ICD-10-CM | POA: Diagnosis not present

## 2018-04-23 DIAGNOSIS — Z6832 Body mass index (BMI) 32.0-32.9, adult: Secondary | ICD-10-CM | POA: Diagnosis not present

## 2018-04-23 DIAGNOSIS — M5126 Other intervertebral disc displacement, lumbar region: Secondary | ICD-10-CM | POA: Diagnosis not present

## 2018-04-23 DIAGNOSIS — M5136 Other intervertebral disc degeneration, lumbar region: Secondary | ICD-10-CM | POA: Diagnosis not present

## 2018-05-07 DIAGNOSIS — Z6831 Body mass index (BMI) 31.0-31.9, adult: Secondary | ICD-10-CM | POA: Diagnosis not present

## 2018-05-07 DIAGNOSIS — M549 Dorsalgia, unspecified: Secondary | ICD-10-CM | POA: Diagnosis not present

## 2018-05-07 DIAGNOSIS — R030 Elevated blood-pressure reading, without diagnosis of hypertension: Secondary | ICD-10-CM | POA: Diagnosis not present

## 2018-05-10 ENCOUNTER — Telehealth: Payer: Self-pay | Admitting: Orthopedic Surgery

## 2018-05-10 NOTE — Telephone Encounter (Signed)
I called and spoke to this patient regarding an appointment.  He was referred here to our office by Dr. Wende Neighbors.  He said he already went to the [facility] and was doing much better.  He did thank me for calling.

## 2018-06-19 DIAGNOSIS — M545 Low back pain: Secondary | ICD-10-CM | POA: Diagnosis not present

## 2018-07-16 DIAGNOSIS — J069 Acute upper respiratory infection, unspecified: Secondary | ICD-10-CM | POA: Diagnosis not present

## 2018-07-16 DIAGNOSIS — Z716 Tobacco abuse counseling: Secondary | ICD-10-CM | POA: Diagnosis not present

## 2018-07-16 DIAGNOSIS — H6123 Impacted cerumen, bilateral: Secondary | ICD-10-CM | POA: Diagnosis not present

## 2018-08-02 DIAGNOSIS — Z85820 Personal history of malignant melanoma of skin: Secondary | ICD-10-CM | POA: Diagnosis not present

## 2018-08-02 DIAGNOSIS — Z85828 Personal history of other malignant neoplasm of skin: Secondary | ICD-10-CM | POA: Diagnosis not present

## 2018-08-02 DIAGNOSIS — L570 Actinic keratosis: Secondary | ICD-10-CM | POA: Diagnosis not present

## 2018-11-19 DIAGNOSIS — I2510 Atherosclerotic heart disease of native coronary artery without angina pectoris: Secondary | ICD-10-CM | POA: Diagnosis not present

## 2018-11-19 DIAGNOSIS — J449 Chronic obstructive pulmonary disease, unspecified: Secondary | ICD-10-CM | POA: Diagnosis not present

## 2018-11-19 DIAGNOSIS — I129 Hypertensive chronic kidney disease with stage 1 through stage 4 chronic kidney disease, or unspecified chronic kidney disease: Secondary | ICD-10-CM | POA: Diagnosis not present

## 2018-11-19 DIAGNOSIS — N183 Chronic kidney disease, stage 3 (moderate): Secondary | ICD-10-CM | POA: Diagnosis not present

## 2018-11-19 DIAGNOSIS — Z716 Tobacco abuse counseling: Secondary | ICD-10-CM | POA: Diagnosis not present

## 2018-11-19 DIAGNOSIS — F17218 Nicotine dependence, cigarettes, with other nicotine-induced disorders: Secondary | ICD-10-CM | POA: Diagnosis not present

## 2018-11-19 DIAGNOSIS — E782 Mixed hyperlipidemia: Secondary | ICD-10-CM | POA: Diagnosis not present

## 2018-11-19 DIAGNOSIS — I712 Thoracic aortic aneurysm, without rupture: Secondary | ICD-10-CM | POA: Diagnosis not present

## 2019-02-01 DIAGNOSIS — D225 Melanocytic nevi of trunk: Secondary | ICD-10-CM | POA: Diagnosis not present

## 2019-02-01 DIAGNOSIS — D1809 Hemangioma of other sites: Secondary | ICD-10-CM | POA: Diagnosis not present

## 2019-02-01 DIAGNOSIS — D485 Neoplasm of uncertain behavior of skin: Secondary | ICD-10-CM | POA: Diagnosis not present

## 2019-02-01 DIAGNOSIS — L570 Actinic keratosis: Secondary | ICD-10-CM | POA: Diagnosis not present

## 2019-02-01 DIAGNOSIS — L820 Inflamed seborrheic keratosis: Secondary | ICD-10-CM | POA: Diagnosis not present

## 2019-02-21 DIAGNOSIS — M25552 Pain in left hip: Secondary | ICD-10-CM | POA: Diagnosis not present

## 2019-03-11 DIAGNOSIS — M25552 Pain in left hip: Secondary | ICD-10-CM | POA: Diagnosis not present

## 2019-03-23 DIAGNOSIS — M25552 Pain in left hip: Secondary | ICD-10-CM | POA: Diagnosis not present

## 2019-07-29 IMAGING — CT CT RENAL STONE PROTOCOL
2 of 4 series · 16 of 46 positions shown, 18 images · non-contrast
Comparison: CT 07/20/2015

CLINICAL DATA: Right flank pain.  History of kidney stones.

EXAM:
CT ABDOMEN AND PELVIS WITHOUT CONTRAST
TECHNIQUE: Multidetector CT imaging of the abdomen and pelvis was performed
following the standard protocol without IV contrast.

[Series 2: axial st · axial · 0.92mm/px · z∈[+975,+1395]mm · 13 of 92 slices shown, 15 images]
[im 4/92  soft-tissue]
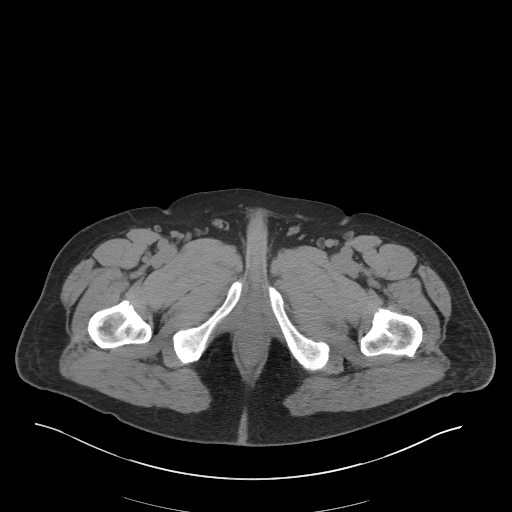
[im 4/92  bone]
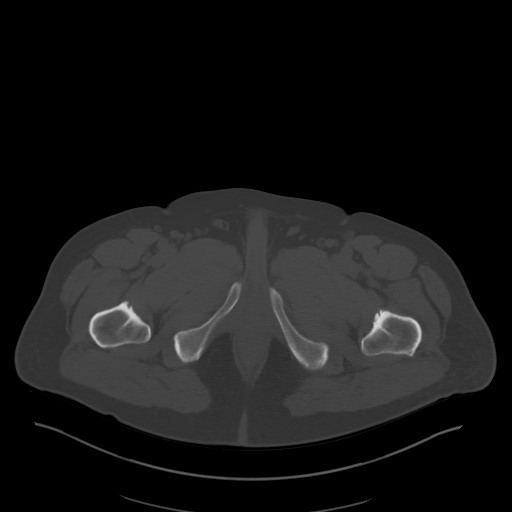
[im 11/92  soft-tissue]
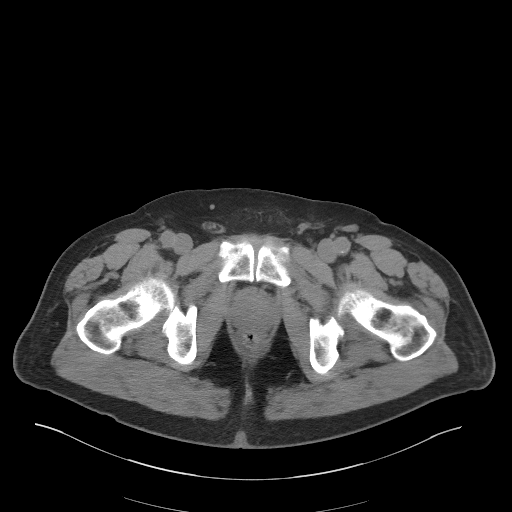
[im 19/92  soft-tissue]
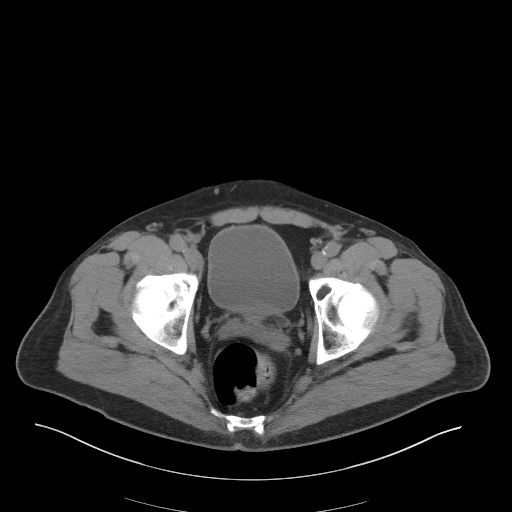
[im 26/92  soft-tissue]
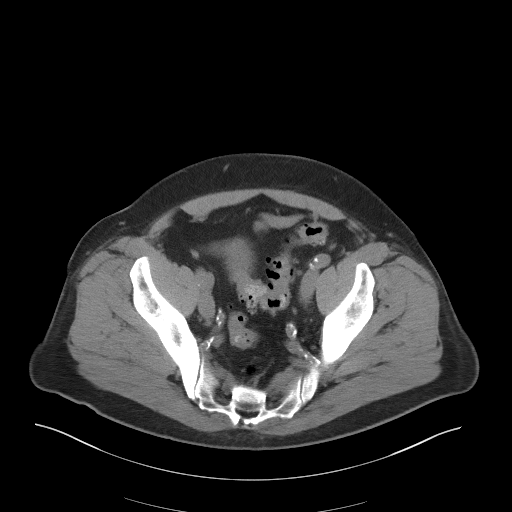
[im 33/92  soft-tissue]
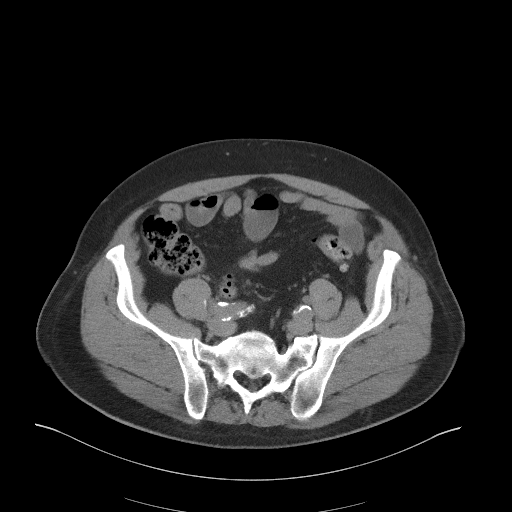
[im 41/92  soft-tissue]
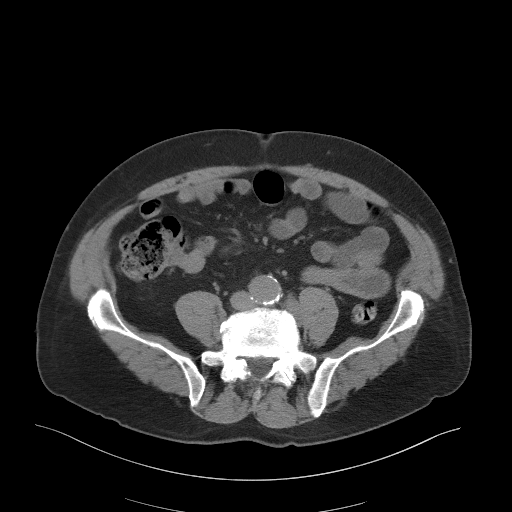
[im 48/92  soft-tissue]
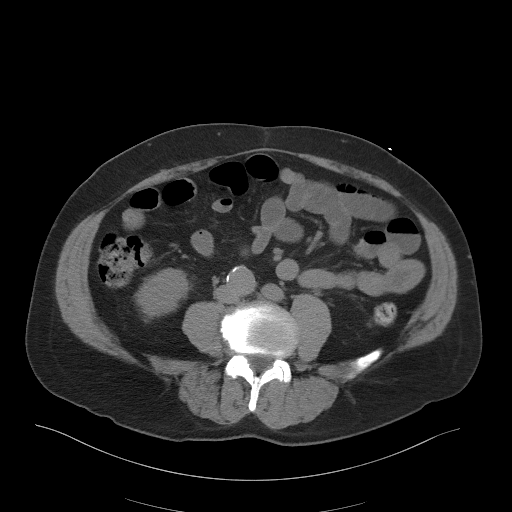
[im 51/92  soft-tissue]
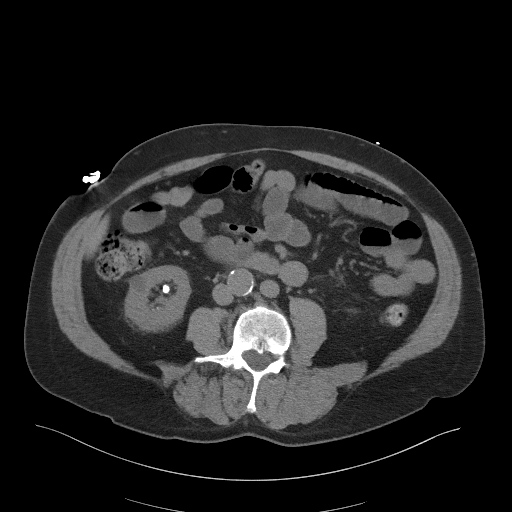
[im 59/92  soft-tissue]
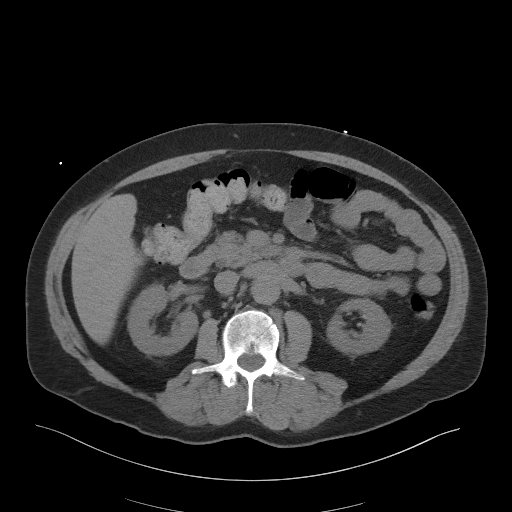
[im 59/92  bone]
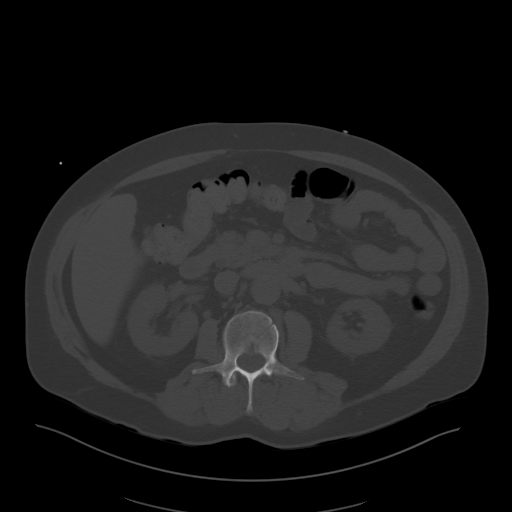
[im 66/92  soft-tissue]
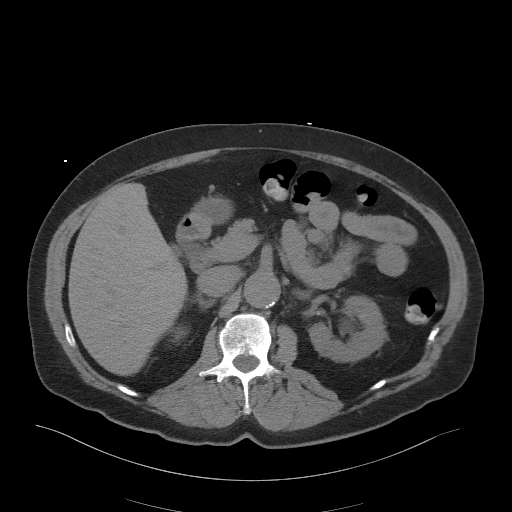
[im 73/92  soft-tissue]
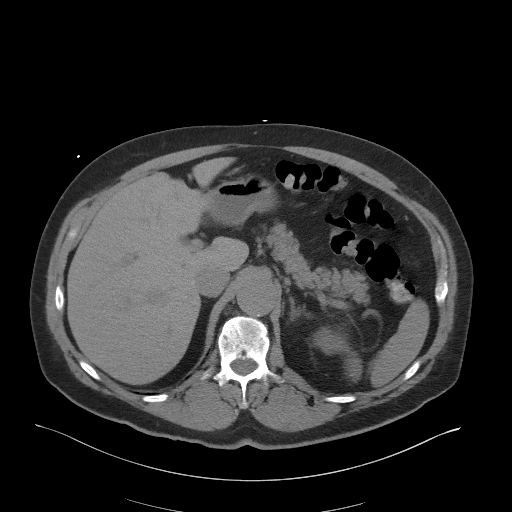
[im 81/92  soft-tissue]
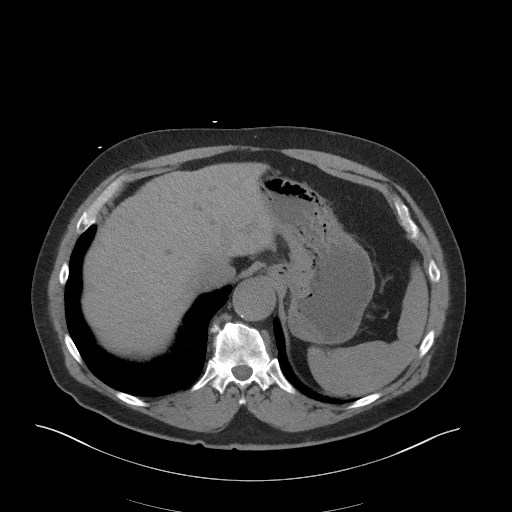
[im 88/92  soft-tissue]
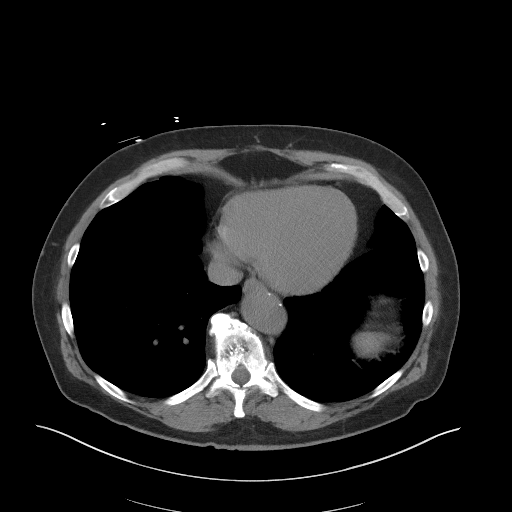

[Series 5: coronal st · coronal · 0.86mm/px · 3 of 103 slices shown]
[im 35/103  soft-tissue]
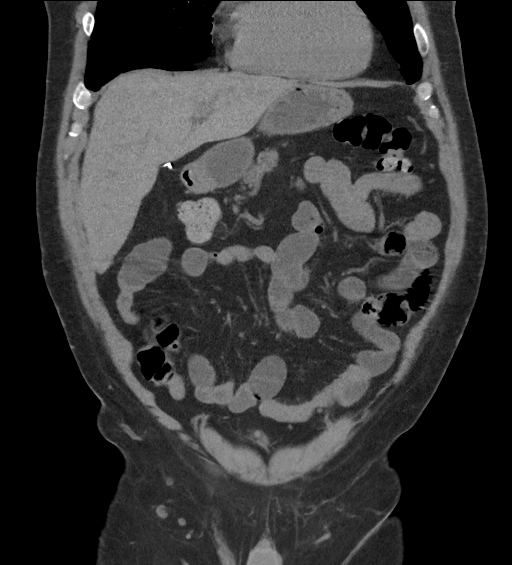
[im 46/103  soft-tissue]
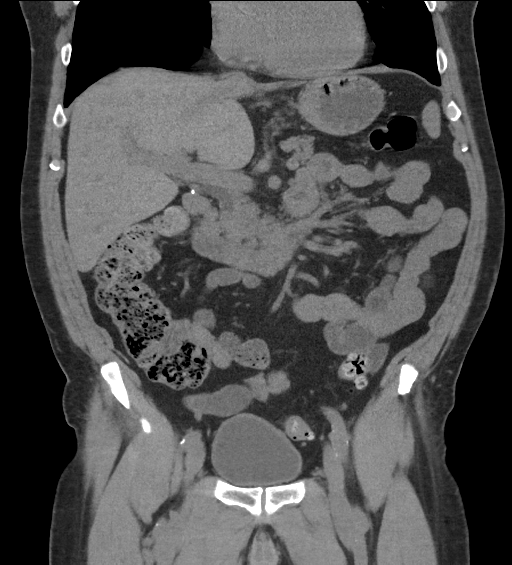
[im 57/103  soft-tissue]
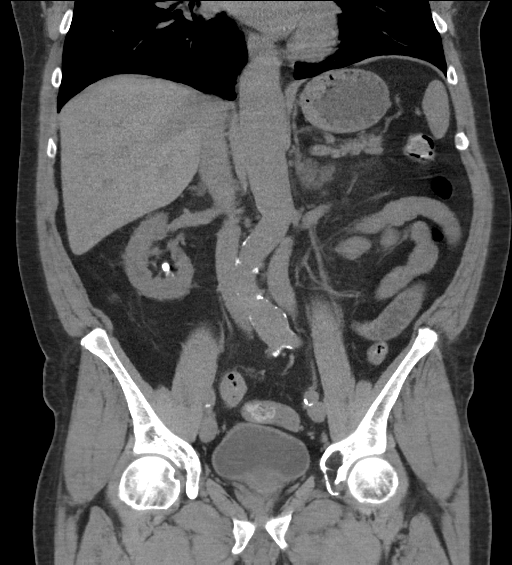

[16 of 46 positions shown; findings below may reference images not displayed]

FINDINGS: Lower chest: Subsegmental atelectasis in both lower lobes. No
confluent consolidation.

Hepatobiliary: No focal hepatic lesion on noncontrast exam.
Postcholecystectomy without biliary dilatation.

Pancreas: No ductal dilatation or inflammation.

Spleen: Normal in size without focal abnormality.

Adrenals/Urinary Tract: No adrenal nodule. Nonobstructing stones in
both kidneys without hydronephrosis or ureteral dilatation. No
ureteral stones. There are at least 6 stones in the right kidney, at
least 4 stones in the left kidney. Low-density lesions in both
kidneys consistent with cysts, incompletely characterized in the
absence of contrast. Urinary bladder is partially distended without
stone or wall thickening.

Stomach/Bowel: Stomach physiologically distended with fluid and
ingested contents. Left upper abdominal small bowel prominent and
fluid-filled without transition point or obstruction. No bowel wall
thickening. Mild fecalization of distal small bowel contents.
Moderate colonic stool burden. Diverticulosis of the distal colon
without diverticulitis. Appendix not visualized, no evidence
appendicitis.

Vascular/Lymphatic: Duplicated IVC, incidental. Aortic
atherosclerosis without abdominal aortic aneurysm. Slight dilatation
of the distal descending thoracic aorta measuring up to 4 cm. No
enlarged abdominal or pelvic lymph nodes.

Reproductive: Prominent prostate gland spanning 5.5 cm.

Other: No free air, free fluid, or intra-abdominal fluid collection.
Small fat containing umbilical hernia.

Musculoskeletal: Incidental hemangioma within T11 vertebra.
Degenerative change in the spine. Unchanged peripherally sclerotic
lucency in the left acetabulum over 2-1/2 years consistent with
benign etiology.
IMPRESSION: 1. Bilateral nonobstructing renal calculi without hydronephrosis or
obstructive uropathy.
2. Prominent fluid-filled small bowel without transition point or
inflammatory change, suggesting ileus. No obstruction.
3. Colonic diverticulosis without diverticulitis.
4. Aortic Atherosclerosis ([id number].A). Included descending
thoracic aorta with borderline aneurysmal dilatation at 4 cm,
similar to 5092 exam.

## 2019-08-04 ENCOUNTER — Telehealth: Payer: Self-pay | Admitting: Cardiology

## 2019-08-04 NOTE — Telephone Encounter (Signed)

## 2019-08-09 ENCOUNTER — Encounter: Payer: Self-pay | Admitting: Family Medicine

## 2019-08-09 ENCOUNTER — Telehealth (INDEPENDENT_AMBULATORY_CARE_PROVIDER_SITE_OTHER): Payer: Medicare Other | Admitting: Family Medicine

## 2019-08-09 VITALS — BP 145/97 | Ht 73.0 in | Wt 265.0 lb

## 2019-08-09 DIAGNOSIS — R079 Chest pain, unspecified: Secondary | ICD-10-CM | POA: Diagnosis not present

## 2019-08-09 DIAGNOSIS — E782 Mixed hyperlipidemia: Secondary | ICD-10-CM | POA: Diagnosis not present

## 2019-08-09 DIAGNOSIS — Z720 Tobacco use: Secondary | ICD-10-CM

## 2019-08-09 DIAGNOSIS — I2510 Atherosclerotic heart disease of native coronary artery without angina pectoris: Secondary | ICD-10-CM

## 2019-08-09 DIAGNOSIS — I208 Other forms of angina pectoris: Secondary | ICD-10-CM

## 2019-08-09 MED ORDER — ISOSORBIDE MONONITRATE ER 30 MG PO TB24: 30.0000 mg | ORAL_TABLET | Freq: Every day | ORAL | 3 refills | Status: DC

## 2019-08-09 NOTE — Progress Notes (Signed)
Virtual Visit via Telephone Note   This visit type was conducted due to national recommendations for restrictions regarding the COVID-19 Pandemic (e.g. social distancing) in an effort to limit this patient's exposure and mitigate transmission in our community.  Due to his co-morbid illnesses, this patient is at least at moderate risk for complications without adequate follow up.  This format is felt to be most appropriate for this patient at this time.  The patient did not have access to video technology/had technical difficulties with video requiring transitioning to audio format only (telephone).  All issues noted in this document were discussed and addressed.  No physical exam could be performed with this format.  Please refer to the patient's chart for his  consent to telehealth for [facility].   Date:  08/09/2019   ID:  Craig Mccann, DOB 05/28/1951, MRN [id number]  Patient Location: Home Provider Location: Office  PCP:  Celene Squibb, MD  Cardiologist:  Carlyle Dolly, MD  Electrophysiologist:  None   Evaluation Performed:  Follow-Up Visit  Chief Complaint: Follow-up  History of Present Illness:    Craig Mccann is a 69 y.o. male last encounter with Bernerd Pho, PA at [facility] [place] on August 10, 2017.  Past medical history of CAD by prior CT, hypertension, hyperlipidemia, nonischemic cardiomyopathy with EF of 45 to 50% in November 2018, COPD, continued tobacco abuse.  He had been seen by Dr. Harl Bowie in December 2018 with reported episodes of chest pain occurring at rest and with activity with associated DOE.  Subsequent stress test was obtained showing moderate inferoseptal and mid inferior defect with peri-infarct ischemia.  He had a subsequent cardiac catheterization secondary to abnormal stress.  No interventions were performed.  He had nonobstructive disease but decreased EF estimated at 40%.  See cath report below.  Medical management and smoking  cessation was recommended.    He had continued at that time to smoke approximately one third of a pack per day of cigarettes.  Today he admits to continued smoking but states 1 pack will last approximately 2 weeks.  He continues with anginal pain which radiates to his neck with no other associated symptoms which is associated with and without exertion.  He states 1 month ago he had an episode of chest pain eventually relieved after second dose of sublingual nitroglycerin.  Patient states he was planning to take a third sublingual but pain was relieved.  He denies any other recent acute illnesses, hospitalizations, surgeries, or travels.  Blood pressure elevated today.  Patient has no history of hypertension.  The patient does not have symptoms concerning for COVID-19 infection (fever, chills, cough, or new shortness of breath).    Past Medical History:  Diagnosis Date  . Acute prostatitis 01/2015  . Allergy   . Basal cell carcinoma 2017   Multiple: back, left shin, left arm  . CAD (coronary artery disease)    a. 07/2017: cath showing 50-60% stenosis along the LCx with a FFR of 0.94 not being hemodynamically significant  . CAP (community acquired pneumonia) 06/26/2015  . Chronic renal insufficiency, stage II (mild)    stage II/II: CrCl 60s.  . Complication of anesthesia   . COPD (chronic obstructive pulmonary disease) ([place]) 05/07/15   Long time smoker + COPD changes noted on lung ca screening CT  . Diverticulitis   . Encounter for screening for lung cancer    Screening CT ok 08/27/16--repeat in 1 yr recommended.  . Family history  of colon cancer in mother   . Hearing impairment    hearing aids  . History of adenomatous polyp of colon 12/24/10  . Hyperlipidemia 07/14/2016   started atorv 20 07/16/16  . Kidney stones    bilat nonobstrucing renal calculi 2016, + left benign renal cysts  . Melanoma ([place])    R posterolateral neck and R nasal ala.  Most recent was left mid back 01/2016  (Dr. Tarri Glenn).  [name] Spinal headache   . Tobacco dependence    CT chest for lung ca screning 04/2015 showed benign findings; 1 yr repeat recommended   Past Surgical History:  Procedure Laterality Date  . CHOLECYSTECTOMY  1987  . COLONOSCOPY  12/24/10; 2014   Multiple TCSs: pt on 5 yr recall, next due 2019.  [name] COLONOSCOPY N/A 10/09/2017   Procedure: COLONOSCOPY;  Surgeon: Danie Binder, MD;  [facility];  Service: Endoscopy;  Laterality: N/A;  1:30  . INGUINAL HERNIA REPAIR  1984  . INTRAVASCULAR PRESSURE WIRE/FFR STUDY N/A 07/14/2017   Procedure: INTRAVASCULAR PRESSURE WIRE/FFR STUDY;  Surgeon: Belva Crome, MD;  Location: [place] [facility];  Service: Cardiovascular;  Laterality: N/A;  . JOINT REPLACEMENT     left  . KNEE SURGERY Left 2015   cartilage repair  . KNEE SURGERY Right 1997   "          "  . LEFT HEART CATH AND CORONARY ANGIOGRAPHY N/A 07/14/2017   Procedure: LEFT HEART CATH AND CORONARY ANGIOGRAPHY;  Surgeon: Belva Crome, MD;  Location: [place] [facility];  Service: Cardiovascular;  Laterality: N/A;  . LITHOTRIPSY    . NECK SURGERY  1989  . Thumb surgery Bilateral 2005   and 2006  . TOTAL KNEE ARTHROPLASTY Left 04/23/2016   Procedure: TOTAL KNEE ARTHROPLASTY;  Surgeon: Ninetta Lights, MD;  Location: [place];  Service: Orthopedics;  Laterality: Left;     Current Meds  Medication Sig  . albuterol (PROAIR HFA) 108 (90 BASE) MCG/ACT inhaler Inhale 2 puffs into the lungs every 6 (six) hours as needed for wheezing or shortness of breath.  [name] aspirin EC 81 MG tablet Take 81 mg daily by mouth.  [name] atorvastatin (LIPITOR) 20 MG tablet Take 1 tablet (20 mg total) by mouth daily.  . nitroGLYCERIN (NITROSTAT) 0.4 MG SL tablet Place 1 tablet (0.4 mg total) under the tongue every 5 (five) minutes as needed for chest pain.     Allergies:   Codeine and Valium [diazepam]   Social History   Tobacco Use  . Smoking status: Current Every Day Smoker    Packs/day: 0.50     Years: 40.00    Pack years: 20.00    Types: Cigarettes    Start date: 08/04/1968  . Smokeless tobacco: Never Used  . Tobacco comment: half pack a day  Substance Use Topics  . Alcohol use: No    Alcohol/week: 0.0 standard drinks  . Drug use: No     Family Hx: The patient's family history includes Brain cancer in his brother; Cancer in his brother, father, and mother; Colon cancer in his mother; Lung cancer in his father.  ROS:   Please see the history of present illness.    All other systems reviewed and are negative.   Prior CV studies:   The following studies were reviewed today:  Cardiac catheterization July 14, 2017 results:  Widely patent coronary arteries.  Right dominant anatomy.  Luminal irregularities throughout the right coronary and  up to 30% distal in the region of tortuosity.  Normal left main.  LAD is large and wraps around the left ventricular apex, containing only mild luminal irregularities.  Eccentric 50-60% stenosis in the mid circumflex but otherwise widely patent circumflex and branches.  FFR 0.94 across the mid circumflex lesion.  Widely patent ramus intermedius with luminal irregularities  Global left ventricular hypokinesis with EF in the 40% range.  Mildly elevated left ventricular end-diastolic pressure at 19 mmHg.  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  RECOMMENDATIONS:  Symptoms are atypical.  He does have eccentric disease in the mid circumflex.  It is not hemodynamically significant based upon an FFR of 0.94.  Nevertheless would recommend antiplatelet therapy, aggressive lipid-lowering, smoking cessation, and other risk modification as indicated.  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   Echocardiogram June 24, 2017 Study Conclusions  - Left ventricle: The cavity size was normal. Wall thickness was   increased  increased in a pattern of mild to moderate LVH.   Systolic function was mildly reduced. The estimated ejection   fraction was in the range of 45% to 50%. Doppler parameters are   consistent with abnormal left ventricular relaxation (grade 1   diastolic dysfunction). - Aortic valve: Valve area (VTI): 1.97 cm^2. Valve area (Vmax):   2.04 cm^2. Valve area (Vmean): 2 cm^2. - Technically adequate study.  Labs/Other Tests and Data Reviewed:    EKG:  An ECG dated July 14, 2017 was personally reviewed today and demonstrated:  Sinus rhythm with occasional PVCs rate of 70  Recent Labs: No results found for requested labs within last 8760 hours.   Recent Lipid Panel Lab Results  Component Value Date/Time   CHOL 193 05/22/2017 10:41 AM   TRIG 87 05/22/2017 10:41 AM   HDL 41 05/22/2017 10:41 AM   CHOLHDL 4.7 05/22/2017 10:41 AM   LDLCALC 133 (H) 05/22/2017 10:41 AM   LDLDIRECT 132.0 07/14/2016 10:45 AM    Wt Readings from Last 3 Encounters:  08/09/19 265 lb (120.2 kg)  02/07/18 253 lb (114.8 kg)  10/09/17 253 lb (114.8 kg)     Objective:    Vital Signs:  BP (!) 145/97   Ht 6\' 1"  (1.854 m)   Wt 265 lb (120.2 kg)   BMI 34.96 kg/m    VITAL SIGNS:  reviewed  ASSESSMENT & PLAN:    1. Atherosclerosis of native coronary artery of native heart without angina pectoris Patient had nonobstructive disease on cardiac catheterization December 2018.  Medical therapy and risk factor modification were recommended such as antiplatelet therapy, lipid management, smoking cessation.  See cardiac cath report above.  2.  Chest pain Continues to have anginal symptoms relieved with 2 nitroglycerin sublingual.  Patient states approximately 1 month ago he had an episode which required 2 nitroglycerin for relief.  States he was considering taking a third but pain was relieved after the first 2 sublingual doses.  Add Imdur 30 mg every 24 hours.  Advised patient to call EMS if he has breakthrough anginal  symptoms and has to take more than 1 sublingual nitroglycerin after starting Imdur.  Patient verbalizes understanding.  If pain continues in spite of medical therapy may need to repeat cardiac catheterization.  3. Mixed hyperlipidemia Continue atorvastatin 20 mg daily.  Patient states he recently had lab work at PCP office.  He is unsure if he had a lipid profile.  We will attempt to  obtain those results.  4. Tobacco abuse Patient states he is down to smoking 1 pack every 2 weeks.  Advised cessation if possible.   COVID-19 Education: The signs and symptoms of COVID-19 were discussed with the patient and how to seek care for testing (follow up with PCP or arrange E-visit).  The importance of social distancing was discussed today.  Time:   Today, I have spent 15 minutes with the patient with telehealth technology discussing the above problems.     Medication Adjustments/Labs and Tests Ordered: Current medicines are reviewed at length with the patient today.  Concerns regarding medicines are outlined above.   Tests Ordered: No orders of the defined types were placed in this encounter.   Medication Changes: No orders of the defined types were placed in this encounter.   Follow Up:  Either In Person or Virtual in 1 year(s)  Signed, Verta Ellen, NP  08/09/2019 1:02 PM    [place]

## 2019-08-09 NOTE — Patient Instructions (Addendum)
Medication Instructions:   Your physician has recommended you make the following change in your medication:   Start imdur 30 mg by mouth daily  Continue other medications the same  Labwork:  NONE  Testing/Procedures:  NONE  Follow-Up:  Your physician recommends that you schedule a follow-up appointment in: 1 year (office or virtual). You will receive a reminder letter in the mail in about 10 months reminding you to call and schedule your appointment. If you don't receive this letter, please contact our office.  Any Other Special Instructions Will Be Listed Below (If Applicable).  If you need a refill on your cardiac medications before your next appointment, please call your pharmacy.

## 2019-08-11 DIAGNOSIS — C44519 Basal cell carcinoma of skin of other part of trunk: Secondary | ICD-10-CM | POA: Diagnosis not present

## 2020-01-03 ENCOUNTER — Other Ambulatory Visit: Payer: Self-pay

## 2020-01-03 ENCOUNTER — Ambulatory Visit (HOSPITAL_COMMUNITY)
Admission: RE | Admit: 2020-01-03 | Discharge: 2020-01-03 | Disposition: A | Discharge status: Home / Self Care | Payer: Medicare Other | Source: Ambulatory Visit | Attending: Internal Medicine | Admitting: Internal Medicine

## 2020-01-03 ENCOUNTER — Other Ambulatory Visit (HOSPITAL_COMMUNITY): Payer: Self-pay | Admitting: Internal Medicine

## 2020-01-03 DIAGNOSIS — W19XXXA Unspecified fall, initial encounter: Secondary | ICD-10-CM | POA: Insufficient documentation

## 2020-01-03 DIAGNOSIS — R0781 Pleurodynia: Secondary | ICD-10-CM

## 2020-01-03 DIAGNOSIS — R079 Chest pain, unspecified: Secondary | ICD-10-CM | POA: Diagnosis not present

## 2020-04-11 ENCOUNTER — Other Ambulatory Visit (HOSPITAL_COMMUNITY): Payer: Self-pay

## 2020-04-11 DIAGNOSIS — U071 COVID-19: Secondary | ICD-10-CM | POA: Diagnosis not present

## 2020-04-12 ENCOUNTER — Other Ambulatory Visit (HOSPITAL_COMMUNITY): Payer: Self-pay | Admitting: Nurse Practitioner

## 2020-04-12 DIAGNOSIS — J438 Other emphysema: Secondary | ICD-10-CM

## 2020-04-12 DIAGNOSIS — I700 Atherosclerosis of aorta: Secondary | ICD-10-CM

## 2020-04-12 DIAGNOSIS — U071 COVID-19: Secondary | ICD-10-CM

## 2020-04-12 DIAGNOSIS — Z720 Tobacco use: Secondary | ICD-10-CM

## 2020-04-12 NOTE — Progress Notes (Signed)
I connected by phone with Craig Mccann on 04/12/2020 at 10:58 AM to discuss the potential use of a new treatment for mild to moderate COVID-19 viral infection in non-hospitalized patients.  This patient is a 69 y.o. male that meets the FDA criteria for Emergency Use Authorization of COVID monoclonal antibody casirivimab/imdevimab.  Has a (+) direct SARS-CoV-2 viral test result  Has mild or moderate COVID-19   Is NOT hospitalized due to COVID-19  Is within 10 days of symptom onset 04/07/20  Has at least one of the high risk factor(s) for progression to severe COVID-19 and/or hospitalization as defined in EUA.  Specific high risk criteria : BMI > 25 and Cardiovascular disease or hypertension   I have spoken and communicated the following to the patient or parent/caregiver regarding COVID monoclonal antibody treatment:  1. FDA has authorized the emergency use for the treatment of mild to moderate COVID-19 in adults and pediatric patients with positive results of direct SARS-CoV-2 viral testing who are 65 years of age and older weighing at least 40 kg, and who are at high risk for progressing to severe COVID-19 and/or hospitalization.  2. The significant known and potential risks and benefits of COVID monoclonal antibody, and the extent to which such potential risks and benefits are unknown.  3. Information on available alternative treatments and the risks and benefits of those alternatives, including clinical trials.  4. Patients treated with COVID monoclonal antibody should continue to self-isolate and use infection control measures (e.g., wear mask, isolate, social distance, avoid sharing personal items, clean and disinfect high touch surfaces, and frequent handwashing) according to CDC guidelines.   5. The patient or parent/caregiver has the option to accept or refuse COVID monoclonal antibody treatment.  After reviewing this information with the patient, The patient agreed to proceed  with receiving casirivimab\imdevimab infusion and will be provided a copy of the Fact sheet prior to receiving the infusion. [name] 04/12/2020 10:58 AM

## 2020-04-13 ENCOUNTER — Ambulatory Visit (HOSPITAL_COMMUNITY): Admission: RE | Admit: 2020-04-13 | Payer: Medicare Other | Source: Ambulatory Visit

## 2020-04-14 ENCOUNTER — Ambulatory Visit (HOSPITAL_COMMUNITY)
Admission: RE | Admit: 2020-04-14 | Discharge: 2020-04-14 | Disposition: A | Discharge status: Home / Self Care | Payer: Medicare Other | Source: Ambulatory Visit | Attending: Pulmonary Disease | Admitting: Pulmonary Disease

## 2020-04-14 DIAGNOSIS — U071 COVID-19: Secondary | ICD-10-CM | POA: Diagnosis not present

## 2020-04-14 DIAGNOSIS — I700 Atherosclerosis of aorta: Secondary | ICD-10-CM

## 2020-04-14 DIAGNOSIS — J438 Other emphysema: Secondary | ICD-10-CM

## 2020-04-14 DIAGNOSIS — Z720 Tobacco use: Secondary | ICD-10-CM

## 2020-04-14 DIAGNOSIS — Z23 Encounter for immunization: Secondary | ICD-10-CM | POA: Insufficient documentation

## 2020-04-14 MED ORDER — SODIUM CHLORIDE 0.9 % IV SOLN: INTRAVENOUS | Status: DC | PRN

## 2020-04-14 MED ORDER — SODIUM CHLORIDE 0.9 % IV SOLN
1200.0000 mg | Freq: Once | INTRAVENOUS | Status: AC
  Administered 2020-04-14: 1200 mg via INTRAVENOUS
  Filled 2020-04-14: qty 10

## 2020-04-14 MED ORDER — EPINEPHRINE 0.3 MG/0.3ML IJ SOAJ: 0.3000 mg | Freq: Once | INTRAMUSCULAR | Status: DC | PRN

## 2020-04-14 MED ORDER — ALBUTEROL SULFATE HFA 108 (90 BASE) MCG/ACT IN AERS: 2.0000 | INHALATION_SPRAY | Freq: Once | RESPIRATORY_TRACT | Status: DC | PRN

## 2020-04-14 MED ORDER — DIPHENHYDRAMINE HCL 50 MG/ML IJ SOLN: 50.0000 mg | Freq: Once | INTRAMUSCULAR | Status: DC | PRN

## 2020-04-14 MED ORDER — METHYLPREDNISOLONE SODIUM SUCC 125 MG IJ SOLR: 125.0000 mg | Freq: Once | INTRAMUSCULAR | Status: DC | PRN

## 2020-04-14 MED ORDER — FAMOTIDINE IN NACL 20-0.9 MG/50ML-% IV SOLN: 20.0000 mg | Freq: Once | INTRAVENOUS | Status: DC | PRN

## 2020-04-14 NOTE — Discharge Instructions (Signed)

## 2020-04-14 NOTE — Progress Notes (Signed)
  Diagnosis: COVID-19  Physician:Dr Joya Gaskins   Procedure: [facility] Med: casirivimab\imdevimab infusion - Provided patient with casirivimab\imdevimab fact sheet for patients, parents and caregivers prior to infusion.  Complications: No immediate complications noted.  Discharge: Discharged home   Dewaine Oats 04/14/2020

## 2020-04-18 ENCOUNTER — Encounter (HOSPITAL_COMMUNITY): Payer: Self-pay

## 2020-04-18 ENCOUNTER — Other Ambulatory Visit: Payer: Self-pay

## 2020-04-18 ENCOUNTER — Emergency Department (HOSPITAL_COMMUNITY)
Admission: EM | Admit: 2020-04-18 | Discharge: 2020-04-18 | Disposition: A | Discharge status: Home / Self Care | Payer: Medicare Other | Attending: Emergency Medicine | Admitting: Emergency Medicine

## 2020-04-18 DIAGNOSIS — U071 COVID-19: Secondary | ICD-10-CM | POA: Insufficient documentation

## 2020-04-18 DIAGNOSIS — J449 Chronic obstructive pulmonary disease, unspecified: Secondary | ICD-10-CM | POA: Insufficient documentation

## 2020-04-18 DIAGNOSIS — R0602 Shortness of breath: Secondary | ICD-10-CM | POA: Diagnosis present

## 2020-04-18 DIAGNOSIS — Z5321 Procedure and treatment not carried out due to patient leaving prior to being seen by health care provider: Secondary | ICD-10-CM | POA: Insufficient documentation

## 2020-04-18 NOTE — ED Triage Notes (Signed)
Pt to er, pt states that he was dx with covid on the 7th, states that he got the antibody infusion at [facility], pt to er because he is feeling sob, states that he also has a hx of copd.

## 2020-07-25 DIAGNOSIS — L570 Actinic keratosis: Secondary | ICD-10-CM | POA: Diagnosis not present

## 2020-07-25 DIAGNOSIS — Z85828 Personal history of other malignant neoplasm of skin: Secondary | ICD-10-CM | POA: Diagnosis not present

## 2020-07-25 DIAGNOSIS — Z85820 Personal history of malignant melanoma of skin: Secondary | ICD-10-CM | POA: Diagnosis not present

## 2020-09-14 DIAGNOSIS — M25552 Pain in left hip: Secondary | ICD-10-CM | POA: Diagnosis not present

## 2020-09-14 DIAGNOSIS — M25571 Pain in right ankle and joints of right foot: Secondary | ICD-10-CM | POA: Diagnosis not present

## 2020-09-18 ENCOUNTER — Telehealth: Payer: Self-pay | Admitting: Family Medicine

## 2020-09-18 NOTE — Telephone Encounter (Signed)
   [facility] Pre-operative Risk Assessment    HEARTCARE STAFF: - Please ensure there is not already an duplicate clearance open for this procedure. - Under Visit Info/Reason for Call, type in Other and utilize the format Clearance MM/DD/YY or Clearance TBD. Do not use dashes or single digits. - If request is for dental extraction, please clarify the # of teeth to be extracted.  Request for surgical clearance:  1. What type of surgery is being performed?  Left total hip replacement  2. When is this surgery scheduled? TBD  3. What type of clearance is required (medical clearance vs. Pharmacy clearance to hold med vs. Both)? Both  4. Are there any medications that need to be held prior to surgery and how long? Please advise   5. Practice name and name of physician performing surgery? [facility]  6. What is the office phone number? [phone]   7.   What is the office fax number? [phone]  8.   Anesthesia type (None, local, MAC, general) ?not specified   Jannet Askew 09/18/2020, 4:43 PM  _________________________________________________________________   (provider comments below)

## 2020-09-19 ENCOUNTER — Telehealth: Payer: Self-pay

## 2020-09-19 DIAGNOSIS — M1612 Unilateral primary osteoarthritis, left hip: Secondary | ICD-10-CM | POA: Diagnosis not present

## 2020-09-19 NOTE — Telephone Encounter (Signed)
Pt has been scheduled to see Levell July, NP for pre op clearance 09/27/20. I will forward notes to NP for upcoming appt. Will send FYI to requesting office pt has appt 09/27/20.

## 2020-09-19 NOTE — Telephone Encounter (Signed)
-----   Message from Michae Kava, Lena sent at 09/19/2020  2:02 PM EST ----- Regarding: PRE OP APPT PLEASE Good afternoon everyone,   Pt needs a pre op appt and looks like he has been seen in both locations. Could someone please help schedule this pt for a pre op appt. I appreciate all of your help in this matter.  Thank you Arbie Cookey

## 2020-09-19 NOTE — Telephone Encounter (Signed)
New message    Left message on machine to schedule surgical clearance with Dr Harl Bowie or APP in either office

## 2020-09-19 NOTE — Telephone Encounter (Signed)
Will send a message to both the [facility] and [facility] scheduling as it looks like pt has been seen in both offices.

## 2020-09-19 NOTE — Telephone Encounter (Signed)
   Primary Cardiologist: Carlyle Dolly, MD  Chart reviewed as part of pre-operative protocol coverage.  Pt was last seen > 1 year ago.  Because of Craig Mccann's past medical history and time since last visit, he will require a follow-up visit in order to better assess preoperative cardiovascular risk.  Pre-op covering staff: - Please schedule appointment and call patient to inform them. If patient already had an upcoming appointment within acceptable timeframe, please add "pre-op clearance" to the appointment notes so provider is aware. - Please contact requesting surgeon's office via preferred method (i.e, phone, fax) to inform them of need for appointment prior to surgery.  Richardson Dopp, PA-C  09/19/2020, 1:16 PM

## 2020-09-24 ENCOUNTER — Encounter: Payer: Self-pay | Admitting: Internal Medicine

## 2020-09-24 DIAGNOSIS — E782 Mixed hyperlipidemia: Secondary | ICD-10-CM | POA: Diagnosis not present

## 2020-09-24 DIAGNOSIS — D1809 Hemangioma of other sites: Secondary | ICD-10-CM | POA: Diagnosis not present

## 2020-09-24 DIAGNOSIS — J441 Chronic obstructive pulmonary disease with (acute) exacerbation: Secondary | ICD-10-CM | POA: Diagnosis not present

## 2020-09-24 DIAGNOSIS — Z01818 Encounter for other preprocedural examination: Secondary | ICD-10-CM | POA: Diagnosis not present

## 2020-09-24 DIAGNOSIS — F17218 Nicotine dependence, cigarettes, with other nicotine-induced disorders: Secondary | ICD-10-CM | POA: Diagnosis not present

## 2020-09-24 DIAGNOSIS — I700 Atherosclerosis of aorta: Secondary | ICD-10-CM | POA: Diagnosis not present

## 2020-09-24 DIAGNOSIS — Z6832 Body mass index (BMI) 32.0-32.9, adult: Secondary | ICD-10-CM | POA: Diagnosis not present

## 2020-09-24 DIAGNOSIS — M5106 Intervertebral disc disorders with myelopathy, lumbar region: Secondary | ICD-10-CM | POA: Diagnosis not present

## 2020-09-24 DIAGNOSIS — I10 Essential (primary) hypertension: Secondary | ICD-10-CM | POA: Diagnosis not present

## 2020-09-24 DIAGNOSIS — N200 Calculus of kidney: Secondary | ICD-10-CM | POA: Diagnosis not present

## 2020-09-24 DIAGNOSIS — R944 Abnormal results of kidney function studies: Secondary | ICD-10-CM | POA: Diagnosis not present

## 2020-09-24 DIAGNOSIS — K5730 Diverticulosis of large intestine without perforation or abscess without bleeding: Secondary | ICD-10-CM | POA: Diagnosis not present

## 2020-09-24 DIAGNOSIS — M62830 Muscle spasm of back: Secondary | ICD-10-CM | POA: Diagnosis not present

## 2020-09-24 DIAGNOSIS — M25552 Pain in left hip: Secondary | ICD-10-CM | POA: Diagnosis not present

## 2020-09-26 NOTE — Progress Notes (Addendum)
Cardiology Office Note  Date: 09/27/2020   ID: Craig Mccann, DOB 15-Mar-1951, MRN [id number]  PCP:  Celene Squibb, MD  Cardiologist:  Carlyle Dolly, MD Electrophysiologist:  None   Chief Complaint: Preop clearance for left total hip replacement surgery  History of Present Illness:  Craig Mccann is a 70 y.o. male. last encounter with Bernerd Pho, PA at [facility] on August 10, 2017.  Past medical history of CAD by prior CT, hypertension, hyperlipidemia, nonischemic cardiomyopathy with EF of 45 to 50% in November 2018, COPD, continued tobacco abuse.  He had been seen by Dr. Harl Bowie in December 2018 with reported episodes of chest pain occurring at rest and with activity with associated DOE.  Subsequent stress test was obtained showing moderate inferoseptal and mid inferior defect with peri-infarct ischemia.  He had a subsequent cardiac catheterization secondary to abnormal stress.  No interventions were performed.  He had nonobstructive disease but decreased EF estimated at 40%.  See cath report below.  Medical management and smoking cessation were recommended.    He had a telemedicine visit with me on 08/09/2019 He had continued at that time to smoke approximately one third of a pack per day of cigarettes.   At that visit he admitted to continued smoking but stated 1 pack would last approximately 2 weeks.  He continued with anginal pain radiating to his neck with no other associated symptoms  associated with and without exertion.  He stated 1 month prior he had an episode of chest pain eventually relieved after second dose of sublingual nitroglycerin.  Patient stated he was planning to take a third sublingual but pain was relieved. He denied any other recent acute illnesses, hospitalizations, surgeries, or travels. Blood pressure elevated.   Has no history of hypertension.  Patient had a presentation to [facility] ED on 04/18/2020.  Stated he had diagnosis of  COVID on 04/10/2020.  He received monoclonal antibody infusion at [facility].  He presented to [facility], ER because he was feeling shortness of breath.  Also history of COPD   Here today for preop clearance for left total hip replacement surgery.  He has been having episodes of intermittent chest pain which he describes as occurring at rest and never with exertion.  He denies radiation to neck, arm, back, jaw or associated nausea, vomiting, or diaphoresis.  He states nitroglycerin sublingual usually relieves the pain.  History of nonischemic cardiomyopathy with last echo cardiogram in 2018 with EF of 45 to 50%.  G1 DD.  He denies any current anginal or exertional symptoms today.  No orthostatic symptoms, no CVA or TIA-like symptoms.  He does have some orthopnea and laying flat.  He has sleep apnea but has been unable to tolerate CPAP.  He sleeps on his side to help with his sleep disordered breathing.  He continues to smoke.  At some point he has stopped his Imdur his Lipitor and does not have a current prescription for nitroglycerin as needed.  States he needs a refill.  Past Medical History:  Diagnosis Date  . Acute prostatitis 01/2015  . Allergy   . Basal cell carcinoma 2017   Multiple: back, left shin, left arm  . CAD (coronary artery disease)    a. 07/2017: cath showing 50-60% stenosis along the LCx with a FFR of 0.94 not being hemodynamically significant  . CAP (community acquired pneumonia) 06/26/2015  . Chronic renal insufficiency, stage II (mild)    stage  II/II: CrCl 60s.  . Complication of anesthesia   . COPD (chronic obstructive pulmonary disease) ([place]) 05/07/15   Long time smoker + COPD changes noted on lung ca screening CT  . Diverticulitis   . Encounter for screening for lung cancer    Screening CT ok 08/27/16--repeat in 1 yr recommended.  . Family history of colon cancer in mother   . Hearing impairment    hearing aids  . History of adenomatous polyp of colon 12/24/10  .  Hyperlipidemia 07/14/2016   started atorv 20 07/16/16  . Kidney stones    bilat nonobstrucing renal calculi 2016, + left benign renal cysts  . Melanoma ([place])    R posterolateral neck and R nasal ala.  Most recent was left mid back 01/2016 (Dr. Tarri Glenn).  [name] Spinal headache   . Tobacco dependence    CT chest for lung ca screning 04/2015 showed benign findings; 1 yr repeat recommended    Past Surgical History:  Procedure Laterality Date  . CHOLECYSTECTOMY  1987  . COLONOSCOPY  12/24/10; 2014   Multiple TCSs: pt on 5 yr recall, next due 2019.  [name] COLONOSCOPY N/A 10/09/2017   Procedure: COLONOSCOPY;  Surgeon: Danie Binder, MD;  Location[facility];  Service: Endoscopy;  Laterality: N/A;  1:30  . INGUINAL HERNIA REPAIR  1984  . INTRAVASCULAR PRESSURE WIRE/FFR STUDY N/A 07/14/2017   Procedure: INTRAVASCULAR PRESSURE WIRE/FFR STUDY;  Surgeon: Belva Crome, MD;  Location: [facility];  Service: Cardiovascular;  Laterality: N/A;  . JOINT REPLACEMENT     left  . KNEE SURGERY Left 2015   cartilage repair  . KNEE SURGERY Right 1997   "          "  . LEFT HEART CATH AND CORONARY ANGIOGRAPHY N/A 07/14/2017   Procedure: LEFT HEART CATH AND CORONARY ANGIOGRAPHY;  Surgeon: Belva Crome, MD;  Location: [facility];  Service: Cardiovascular;  Laterality: N/A;  . LITHOTRIPSY    . NECK SURGERY  1989  . Thumb surgery Bilateral 2005   and 2006  . TOTAL KNEE ARTHROPLASTY Left 04/23/2016   Procedure: TOTAL KNEE ARTHROPLASTY;  Surgeon: Ninetta Lights, MD;  Location: [facility];  Service: Orthopedics;  Laterality: Left;    Current Outpatient Medications  Medication Sig Dispense Refill  . albuterol (PROAIR HFA) 108 (90 BASE) MCG/ACT inhaler Inhale 2 puffs into the lungs every 6 (six) hours as needed for wheezing or shortness of breath. 1 Inhaler 1  . aspirin EC 81 MG tablet Take 81 mg daily by mouth.    . nitroGLYCERIN (NITROSTAT) 0.4 MG SL tablet Place 1 tablet (0.4 mg total) under the  tongue every 5 (five) minutes as needed for chest pain. 25 tablet 3   No current facility-administered medications for this visit.   Allergies:  Codeine and Valium [diazepam]   Social History: The patient  reports that he has been smoking cigarettes. He started smoking about 52 years ago. He has a 20.00 pack-year smoking history. He has never used smokeless tobacco. He reports that he does not drink alcohol and does not use drugs.   Family History: The patient's family history includes Brain cancer in his brother; Cancer in his brother, father, and mother; Colon cancer in his mother; Lung cancer in his father.   ROS:  Please see the history of present illness. Otherwise, complete review of systems is positive for none.  All other systems are reviewed and negative.   Physical  Exam: VS:  BP 118/72   Pulse (!) 50   Ht 6' 1.5" (1.867 m)   Wt 257 lb 3.2 oz (116.7 kg)   SpO2 96%   BMI 33.47 kg/m , BMI Body mass index is 33.47 kg/m.  Wt Readings from Last 3 Encounters:  09/27/20 257 lb 3.2 oz (116.7 kg)  08/09/19 265 lb (120.2 kg)  02/07/18 253 lb (114.8 kg)    General: Patient appears comfortable at rest. Neck: Supple, no elevated JVP or carotid bruits, no thyromegaly. Lungs: Clear to auscultation, nonlabored breathing at rest. Cardiac: Regular rate and rhythm, no S3 or significant systolic murmur, no pericardial rub. Extremities: No pitting edema, distal pulses 2+. Skin: Warm and dry. Musculoskeletal: No kyphosis. Neuropsychiatric: Alert and oriented x3, affect grossly appropriate.  ECG:  An ECG dated 09/27/2020 was personally reviewed today and demonstrated:  Sinus rhythm with occasional premature ventricular complexes rate of 79, septal infarct, age undetermined.  Recent Labwork: No results found for requested labs within last 8760 hours.     Component Value Date/Time   CHOL 193 05/22/2017 1041   TRIG 87 05/22/2017 1041   HDL 41 05/22/2017 1041   CHOLHDL 4.7 05/22/2017 1041    VLDL 36.6 08/13/2016 1126   LDLCALC 133 (H) 05/22/2017 1041   LDLDIRECT 132.0 07/14/2016 1045    Other Studies Reviewed Today:  The following studies were reviewed today:  Cardiac catheterization July 14, 2017 results:  Widely patent coronary arteries.  Right dominant anatomy. Luminal irregularities throughout the right coronary and up to 30% distal in the region of tortuosity.  Normal left main.  LAD is large and wraps around the left ventricular apex, containing only mild luminal irregularities.  Eccentric 50-60% stenosis in the mid circumflex but otherwise widely patent circumflex and branches. FFR 0.94 across the mid circumflex lesion.  Widely patent ramus intermedius with luminal irregularities  Global left ventricular hypokinesis with EF in the 40% range. Mildly elevated left ventricular end-diastolic pressure at 19 mmHg. 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  RECOMMENDATIONS:  Symptoms are atypical. He does have eccentric disease in the mid circumflex. It is not hemodynamically significant based upon an FFR of 0.94. Nevertheless would recommend antiplatelet therapy, aggressive lipid-lowering, smoking cessation, and other risk modification as indicated.  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 Diagnostic Dominance: Right       Echocardiogram June 24, 2017 Study Conclusions  - Left ventricle: The cavity size was normal. Wall thickness was increased increased in a pattern of mild to moderate LVH. Systolic function was mildly reduced. The estimated ejection fraction was in the range of 45% to 50%. Doppler parameters are consistent with abnormal left ventricular relaxation (grade 1 diastolic dysfunction). - Aortic valve: Valve area (VTI): 1.97 cm^2. Valve area (Vmax): 2.04 cm^2. Valve area (Vmean): 2 cm^2. -  Technically adequate study.  Assessment and Plan:  1. Preoperative clearance   2. CAD in native artery   3. Stable angina pectoris ([place])   4. Mixed hyperlipidemia   5. Tobacco abuse    1.  Preoperative clearance Patient has pending left total hip replacement surgery.  He will need a Lexiscan stress test and an echocardiogram to reassess for clearance.  Patient wants all testing done after March 7 due to his insurance issues.  He states the insurance will likely pay for these tests if we perform them after  10/08/2020   2. Atherosclerosis of native coronary artery of  native heart without angina pectoris Patient had nonobstructive disease on cardiac catheterization December 2018.  Medical therapy and risk factor modification were recommended such as antiplatelet therapy, lipid management, smoking cessation.  See cardiac cath report above.  Recent episodes of chest pain relieved with nitroglycerin.  Please get a Lexiscan stress test.  3.  Chest pain Continues to have anginal symptoms relieved with 2 nitroglycerin sublingual occurring mostly at rest.  He denies any radiation to neck, arm, back, jaw.  No associated symptoms..  Patient states approximately 1 month ago he had an episode which required 2 nitroglycerin for relief.  He had previously stopped his Imdur due to headaches.  We will reduce that dose of Imdur to 15 mg daily.  Please refill sublingual nitroglycerin  4. Mixed hyperlipidemia Patient states in the interim since last visit he had stopped his Lipitor.  Restart Lipitor at 20 mg daily.  In 6 to 8 weeks get a follow-up fasting lipid profile and liver function test after initiation.    5. Tobacco abuse Patient states he is down to smoking 1 pack every 2 weeks.  Advised cessation   6.  Nonischemic cardiomyopathy Last echocardiogram in 2018 showed EF of 45 to 50% with mild to moderate LVH G1 DD.  Please repeat echocardiogram to reassess prior to surgery  Medication  Adjustments/Labs and Tests Ordered: Current medicines are reviewed at length with the patient today.  Concerns regarding medicines are outlined above.   Disposition: Follow-up with Dr. Harl Bowie or APP 6 to 8 weeks  Signed, Levell July, NP 09/27/2020 8:54 AM    [facility] [place], [place], [place] [address] Phone: [phone]; Fax: [phone]

## 2020-09-27 ENCOUNTER — Telehealth: Payer: Self-pay | Admitting: Family Medicine

## 2020-09-27 ENCOUNTER — Encounter: Payer: Self-pay | Admitting: Family Medicine

## 2020-09-27 ENCOUNTER — Ambulatory Visit (INDEPENDENT_AMBULATORY_CARE_PROVIDER_SITE_OTHER): Payer: Medicare Other | Admitting: Family Medicine

## 2020-09-27 ENCOUNTER — Encounter: Payer: Self-pay | Admitting: *Deleted

## 2020-09-27 VITALS — BP 118/72 | HR 50 | Ht 73.5 in | Wt 257.2 lb

## 2020-09-27 DIAGNOSIS — Z01818 Encounter for other preprocedural examination: Secondary | ICD-10-CM

## 2020-09-27 DIAGNOSIS — R0789 Other chest pain: Secondary | ICD-10-CM

## 2020-09-27 DIAGNOSIS — I5030 Unspecified diastolic (congestive) heart failure: Secondary | ICD-10-CM

## 2020-09-27 DIAGNOSIS — E782 Mixed hyperlipidemia: Secondary | ICD-10-CM | POA: Diagnosis not present

## 2020-09-27 DIAGNOSIS — Z720 Tobacco use: Secondary | ICD-10-CM

## 2020-09-27 DIAGNOSIS — I208 Other forms of angina pectoris: Secondary | ICD-10-CM | POA: Diagnosis not present

## 2020-09-27 DIAGNOSIS — I2510 Atherosclerotic heart disease of native coronary artery without angina pectoris: Secondary | ICD-10-CM

## 2020-09-27 MED ORDER — ISOSORBIDE MONONITRATE ER 30 MG PO TB24: 15.0000 mg | ORAL_TABLET | Freq: Every day | ORAL | 6 refills | Status: DC

## 2020-09-27 MED ORDER — ATORVASTATIN CALCIUM 20 MG PO TABS: 20.0000 mg | ORAL_TABLET | Freq: Every day | ORAL | 6 refills | Status: DC

## 2020-09-27 MED ORDER — NITROGLYCERIN 0.4 MG SL SUBL: 0.4000 mg | SUBLINGUAL_TABLET | SUBLINGUAL | 3 refills | Status: DC | PRN

## 2020-09-27 NOTE — Patient Instructions (Signed)
Medication Instructions:   Resume Lipitor at 20mg  daily.   Resume Imdur at 15mg  daily.   Nitroglycerin also refilled today.   Continue all other medications.    Labwork: FLP, LFT - due in 8 weeks - will send reminder in the mail.  Testing/Procedures:  Your physician has requested that you have a lexiscan myoview. For further information please visit [email or web address]. Please follow instruction sheet, as given.  Your physician has requested that you have an echocardiogram. Echocardiography is a painless test that uses sound waves to create images of your heart. It provides your doctor with information about the size and shape of your heart and how well your heart's chambers and valves are working. This procedure takes approximately one hour. There are no restrictions for this procedure.  Office will contact with results via phone or letter.    Follow-Up: 6-8 weeks   Any Other Special Instructions Will Be Listed Below (If Applicable).  If you need a refill on your cardiac medications before your next appointment, please call your pharmacy.

## 2020-09-27 NOTE — Telephone Encounter (Signed)
Pre-cert Verification for the following procedure      LEXISCAN - 10/12/2020 [place] WITH [employer] MEDICARE   [facility]- 10/16/2020 [place] :

## 2020-09-29 DIAGNOSIS — S0502XA Injury of conjunctiva and corneal abrasion without foreign body, left eye, initial encounter: Secondary | ICD-10-CM | POA: Diagnosis not present

## 2020-09-29 DIAGNOSIS — H1032 Unspecified acute conjunctivitis, left eye: Secondary | ICD-10-CM | POA: Diagnosis not present

## 2020-10-10 DIAGNOSIS — R7303 Prediabetes: Secondary | ICD-10-CM | POA: Diagnosis not present

## 2020-10-10 DIAGNOSIS — E782 Mixed hyperlipidemia: Secondary | ICD-10-CM | POA: Diagnosis not present

## 2020-10-10 DIAGNOSIS — H1032 Unspecified acute conjunctivitis, left eye: Secondary | ICD-10-CM | POA: Diagnosis not present

## 2020-10-10 DIAGNOSIS — R944 Abnormal results of kidney function studies: Secondary | ICD-10-CM | POA: Diagnosis not present

## 2020-10-10 DIAGNOSIS — I712 Thoracic aortic aneurysm, without rupture: Secondary | ICD-10-CM | POA: Diagnosis not present

## 2020-10-10 DIAGNOSIS — N200 Calculus of kidney: Secondary | ICD-10-CM | POA: Diagnosis not present

## 2020-10-10 DIAGNOSIS — I2510 Atherosclerotic heart disease of native coronary artery without angina pectoris: Secondary | ICD-10-CM | POA: Diagnosis not present

## 2020-10-10 DIAGNOSIS — I10 Essential (primary) hypertension: Secondary | ICD-10-CM | POA: Diagnosis not present

## 2020-10-10 DIAGNOSIS — Z0000 Encounter for general adult medical examination without abnormal findings: Secondary | ICD-10-CM | POA: Diagnosis not present

## 2020-10-10 DIAGNOSIS — S0502XA Injury of conjunctiva and corneal abrasion without foreign body, left eye, initial encounter: Secondary | ICD-10-CM | POA: Diagnosis not present

## 2020-10-12 ENCOUNTER — Telehealth: Payer: Self-pay | Admitting: Student

## 2020-10-12 ENCOUNTER — Other Ambulatory Visit: Payer: Self-pay

## 2020-10-12 ENCOUNTER — Ambulatory Visit (HOSPITAL_COMMUNITY)
Admission: RE | Admit: 2020-10-12 | Discharge: 2020-10-12 | Disposition: A | Discharge status: Home / Self Care | Payer: Medicare HMO | Source: Ambulatory Visit | Attending: Family Medicine | Admitting: Family Medicine

## 2020-10-12 ENCOUNTER — Encounter (HOSPITAL_COMMUNITY): Payer: Self-pay

## 2020-10-12 ENCOUNTER — Encounter (HOSPITAL_COMMUNITY)
Admission: RE | Admit: 2020-10-12 | Discharge: 2020-10-12 | Disposition: A | Discharge status: Home / Self Care | Payer: Medicare HMO | Source: Ambulatory Visit | Attending: Family Medicine | Admitting: Family Medicine

## 2020-10-12 ENCOUNTER — Encounter (HOSPITAL_BASED_OUTPATIENT_CLINIC_OR_DEPARTMENT_OTHER)
Admission: RE | Admit: 2020-10-12 | Discharge: 2020-10-12 | Disposition: A | Discharge status: Home / Self Care | Payer: Medicare HMO | Source: Ambulatory Visit | Attending: Family Medicine | Admitting: Family Medicine

## 2020-10-12 DIAGNOSIS — Z01818 Encounter for other preprocedural examination: Secondary | ICD-10-CM

## 2020-10-12 DIAGNOSIS — R0789 Other chest pain: Secondary | ICD-10-CM | POA: Insufficient documentation

## 2020-10-12 DIAGNOSIS — I5030 Unspecified diastolic (congestive) heart failure: Secondary | ICD-10-CM

## 2020-10-12 LAB — NM MYOCAR MULTI W/SPECT W/WALL MOTION / EF
LV dias vol: 300 mL (ref 62–150)
LV sys vol: 212 mL
Peak HR: 80 {beats}/min
RATE: 0.3
Rest HR: 74 {beats}/min
SDS: 1
SRS: 5
SSS: 6
TID: 1.09

## 2020-10-12 LAB — ECHOCARDIOGRAM COMPLETE
Area-P 1/2: 2.68 cm2
Calc EF: 34.3 %
S' Lateral: 4.8 cm
Single Plane A2C EF: 42.3 %
Single Plane A4C EF: 29.2 %

## 2020-10-12 MED ORDER — TECHNETIUM TC 99M TETROFOSMIN IV KIT
30.0000 | PACK | Freq: Once | INTRAVENOUS | Status: AC | PRN
  Administered 2020-10-12: 33 via INTRAVENOUS

## 2020-10-12 MED ORDER — TECHNETIUM TC 99M TETROFOSMIN IV KIT
10.0000 | PACK | Freq: Once | INTRAVENOUS | Status: AC | PRN
  Administered 2020-10-12: 11 via INTRAVENOUS

## 2020-10-12 MED ORDER — REGADENOSON 0.4 MG/5ML IV SOLN
INTRAVENOUS | Status: AC
  Administered 2020-10-12: 0.4 mg via INTRAVENOUS
  Filled 2020-10-12: qty 5

## 2020-10-12 MED ORDER — SODIUM CHLORIDE FLUSH 0.9 % IV SOLN
INTRAVENOUS | Status: AC
  Administered 2020-10-12: 10 mL via INTRAVENOUS
  Filled 2020-10-12: qty 10

## 2020-10-12 MED ORDER — AMINOPHYLLINE 25 MG/ML IV SOLN
INTRAVENOUS | Status: AC
  Administered 2020-10-12: 25 mg via INTRAVENOUS
  Filled 2020-10-12: qty 10

## 2020-10-12 NOTE — Telephone Encounter (Signed)
    The patient developed transient heart block with Lexiscan during stress testing. Aminophylline administered with return to NSR. Monitored afterwards for an extended period of time and maintained NSR with no recurrent bradycardia. Lexiscan added to allergy list as this should not be administered in the future.    Signed, Erma Heritage, PA-C 10/12/2020, 12:18 PM

## 2020-10-12 NOTE — Progress Notes (Signed)
*  PRELIMINARY RESULTS* Echocardiogram 2D Echocardiogram has been performed.  Craig Mccann 10/12/2020, 1:00 PM

## 2020-10-15 ENCOUNTER — Encounter: Payer: Self-pay | Admitting: Family Medicine

## 2020-10-15 ENCOUNTER — Ambulatory Visit (INDEPENDENT_AMBULATORY_CARE_PROVIDER_SITE_OTHER): Payer: Medicare HMO | Admitting: Family Medicine

## 2020-10-15 ENCOUNTER — Other Ambulatory Visit: Payer: Self-pay

## 2020-10-15 VITALS — BP 138/100 | HR 98 | Ht 73.5 in | Wt 256.8 lb

## 2020-10-15 DIAGNOSIS — I208 Other forms of angina pectoris: Secondary | ICD-10-CM

## 2020-10-15 DIAGNOSIS — I5030 Unspecified diastolic (congestive) heart failure: Secondary | ICD-10-CM

## 2020-10-15 MED ORDER — ISOSORBIDE MONONITRATE ER 30 MG PO TB24: 30.0000 mg | ORAL_TABLET | Freq: Every day | ORAL | 6 refills | Status: DC

## 2020-10-15 MED ORDER — CARVEDILOL 3.125 MG PO TABS: 3.1250 mg | ORAL_TABLET | Freq: Two times a day (BID) | ORAL | 6 refills | Status: DC

## 2020-10-15 NOTE — Progress Notes (Signed)
Cardiology Office Note  Date: 10/15/2020   ID: Craig Mccann, DOB 09-Jul-1951, MRN [id number]  PCP:  Celene Squibb, MD  Cardiologist:  Carlyle Dolly, MD Electrophysiologist:  None   Chief Complaint: Preop clearance for left total hip replacement surgery  History of Present Illness:  Craig Mccann is a 70 y.o. male. last encounter with Bernerd Pho, PA at [facility] care of [place] on August 10, 2017.  Past medical history of CAD by prior CT, hypertension, hyperlipidemia, nonischemic cardiomyopathy with EF of 45 to 50% in November 2018, COPD, continued tobacco abuse.  He had been seen by Dr. Harl Bowie in December 2018 with reported episodes of chest pain occurring at rest and with activity with associated DOE.  Subsequent stress test was obtained showing moderate inferoseptal and mid inferior defect with peri-infarct ischemia.  He had a subsequent cardiac catheterization secondary to abnormal stress.  No interventions were performed.  He had nonobstructive disease but decreased EF estimated at 40%.  See cath report below.  Medical management and smoking cessation were recommended.    He continued with anginal pain radiating to his neck with no other associated symptoms  associated with and without exertion.  He stated 1 month prior he had an episode of chest pain eventually relieved after second dose of sublingual nitroglycerin.  Patient stated he was planning to take a third sublingual but pain was relieved   Patient had a presentation to [facility] ED on 04/18/2020.  Stated he had diagnosis of COVID on 04/10/2020.  He received monoclonal antibody infusion at [facility].  He presented to [facility], ER because he was feeling shortness of breath.  Also history of COPD   He was here last visit for preop clearance for left total hip replacement surgery.  He had been having episodes of intermittent chest pain which he described as occurring at rest and never with exertion.  He denied  radiation to neck, arm, back, jaw or associated nausea, vomiting, or diaphoresis.  He stated nitroglycerin sublingual usually relieved the pain.  History of nonischemic cardiomyopathy with last echo cardiogram in 2018 with EF of 45 to 50%.  G1 DD.  He denied any current anginal or exertional symptoms that day.  History of OSA but unable to tolerate CPAP.  Stated he slept on his side to help with his sleep disordered breathing.  He continued to smoke.  At some point he stopped his Imdur and Lipitor and did not have a current prescription for nitroglycerin as needed.  Stated he needed a refill.   He is here today for follow-up on recent echocardiogram and stress test.  I reviewed both tests with him at length.  Advised him I had spoken to Dr. Harl Bowie due to the abnormalities on both the echocardiogram and stress test and advised elective hip surgery needed to be postponed and he needed to be optimized on medical therapy for systolic heart failure and considered for cardiac catheterization given prior history of CAD, decreased EF, and chest pain.  See stress and echo reports below.  Patient is somewhat upset that he is not able to proceed with his surgery.  He states he has been having significant fatigue recently.  Denies any overt chest pain at this time.  He states he wants to go back to work but needs the hip surgery first.  Blood pressure is elevated on arrival 138/100.  He states he smoked a cigarette earlier today.  Pulse rate was 98.  Previous blood pressure on 09/27/2020 was 118/72.   Past Medical History:  Diagnosis Date  . Acute prostatitis 01/2015  . Allergy   . Basal cell carcinoma 2017   Multiple: back, left shin, left arm  . CAD (coronary artery disease)    a. 07/2017: cath showing 50-60% stenosis along the LCx with a FFR of 0.94 not being hemodynamically significant  . CAP (community acquired pneumonia) 06/26/2015  . Chronic renal insufficiency, stage II (mild)    stage II/II: CrCl 60s.  .  Complication of anesthesia   . COPD (chronic obstructive pulmonary disease) ([place]) 05/07/15   Long time smoker + COPD changes noted on lung ca screening CT  . Diverticulitis   . Encounter for screening for lung cancer    Screening CT ok 08/27/16--repeat in 1 yr recommended.  . Family history of colon cancer in mother   . Hearing impairment    hearing aids  . History of adenomatous polyp of colon 12/24/10  . Hyperlipidemia 07/14/2016   started atorv 20 07/16/16  . Kidney stones    bilat nonobstrucing renal calculi 2016, + left benign renal cysts  . Melanoma ([place])    R posterolateral neck and R nasal ala.  Most recent was left mid back 01/2016 (Dr. Tarri Glenn).  [name] Spinal headache   . Tobacco dependence    CT chest for lung ca screning 04/2015 showed benign findings; 1 yr repeat recommended    Past Surgical History:  Procedure Laterality Date  . CHOLECYSTECTOMY  1987  . COLONOSCOPY  12/24/10; 2014   Multiple TCSs: pt on 5 yr recall, next due 2019.  [name] COLONOSCOPY N/A 10/09/2017   Procedure: COLONOSCOPY;  Surgeon: Danie Binder, MD;  Location: [facility];  Service: Endoscopy;  Laterality: N/A;  1:30  . INGUINAL HERNIA REPAIR  1984  . INTRAVASCULAR PRESSURE WIRE/FFR STUDY N/A 07/14/2017   Procedure: INTRAVASCULAR PRESSURE WIRE/FFR STUDY;  Surgeon: Belva Crome, MD;  Location: [facility];  Service: Cardiovascular;  Laterality: N/A;  . JOINT REPLACEMENT     left  . KNEE SURGERY Left 2015   cartilage repair  . KNEE SURGERY Right 1997   "          "  . LEFT HEART CATH AND CORONARY ANGIOGRAPHY N/A 07/14/2017   Procedure: LEFT HEART CATH AND CORONARY ANGIOGRAPHY;  Surgeon: Belva Crome, MD;  Location: [facility];  Service: Cardiovascular;  Laterality: N/A;  . LITHOTRIPSY    . NECK SURGERY  1989  . Thumb surgery Bilateral 2005   and 2006  . TOTAL KNEE ARTHROPLASTY Left 04/23/2016   Procedure: TOTAL KNEE ARTHROPLASTY;  Surgeon: Ninetta Lights, MD;  Location: [facility];   Service: Orthopedics;  Laterality: Left;    Current Outpatient Medications  Medication Sig Dispense Refill  . albuterol (PROAIR HFA) 108 (90 BASE) MCG/ACT inhaler Inhale 2 puffs into the lungs every 6 (six) hours as needed for wheezing or shortness of breath. 1 Inhaler 1  . aspirin EC 81 MG tablet Take 81 mg daily by mouth.    [name] atorvastatin (LIPITOR) 20 MG tablet Take 1 tablet (20 mg total) by mouth daily. 30 tablet 6  . isosorbide mononitrate (IMDUR) 30 MG 24 hr tablet Take 0.5 tablets (15 mg total) by mouth daily. 15 tablet 6  . nitroGLYCERIN (NITROSTAT) 0.4 MG SL tablet Place 1 tablet (0.4 mg total) under the tongue every 5 (five) minutes as needed for chest pain. 25 tablet 3  No current facility-administered medications for this visit.   Allergies:  Codeine, Lexiscan [regadenoson], and Valium [diazepam]   Social History: The patient  reports that he has been smoking cigarettes. He started smoking about 52 years ago. He has a 20.00 pack-year smoking history. He has never used smokeless tobacco. He reports that he does not drink alcohol and does not use drugs.   Family History: The patient's family history includes Brain cancer in his brother; Cancer in his brother, father, and mother; Colon cancer in his mother; Lung cancer in his father.   ROS:  Please see the history of present illness. Otherwise, complete review of systems is positive for none.  All other systems are reviewed and negative.   Physical Exam: VS:  BP (!) 138/100   Pulse 98   Ht 6' 1.5" (1.867 m)   Wt 256 lb 12.8 oz (116.5 kg)   SpO2 98%   BMI 33.42 kg/m , BMI Body mass index is 33.42 kg/m.  Wt Readings from Last 3 Encounters:  10/15/20 256 lb 12.8 oz (116.5 kg)  09/27/20 257 lb 3.2 oz (116.7 kg)  08/09/19 265 lb (120.2 kg)    General: Patient appears comfortable at rest. Neck: Supple, no elevated JVP or carotid bruits, no thyromegaly. Lungs: Clear to auscultation, nonlabored breathing at rest. Cardiac:  Regular rate and rhythm, no S3 or significant systolic murmur, no pericardial rub. Extremities: No pitting edema, distal pulses 2+. Skin: Warm and dry. Musculoskeletal: No kyphosis. Neuropsychiatric: Alert and oriented x3, affect grossly appropriate.  ECG:  An ECG dated 09/27/2020 was personally reviewed today and demonstrated:  Sinus rhythm with occasional premature ventricular complexes rate of 79, septal infarct, age undetermined.  Recent Labwork: No results found for requested labs within last 8760 hours.     Component Value Date/Time   CHOL 193 05/22/2017 1041   TRIG 87 05/22/2017 1041   HDL 41 05/22/2017 1041   CHOLHDL 4.7 05/22/2017 1041   VLDL 36.6 08/13/2016 1126   LDLCALC 133 (H) 05/22/2017 1041   LDLDIRECT 132.0 07/14/2016 1045    Other Studies Reviewed Today:   Nuclear stress test 10/12/2020 Study Result  Narrative & Impression   No diagnostic ST segment changes of her baseline to indicate ischemia. Patient developed high degree heart block following Lexiscan infusion. Aminophyllin was given with subsequent recovery and no persistent heart block or arrhythmia.  Would not utilize Lexiscan or adenosine for any future pharmacologic myocardial perfusion studies.  Medium sized, mild intensity, apical to basal inferior defect also involving the inferior septum and inferolateral wall at the base. This is fixed, most prominent on rest imaging, and therefore suggestive of soft tissue attenuation, although scar cannot be excluded. No large ischemic territories. Left ventricle is dilated (TID ratio normal).  This is a high risk study based predominantly on reduced LVEF.  Nuclear stress EF: 29%. Suggest echocardiography for corroboration.    Echocardiogram 10/12/2020 1. Left ventricular ejection fraction, by estimation, is 30 to 35%. The left ventricle has moderately decreased function. The left ventricle demonstrates global hypokinesis. The left ventricular internal cavity  size was mildly dilated. There is moderate to severe asymmetric left ventricular hypertrophy of the septal segment. Left ventricular diastolic parameters are consistent with Grade I diastolic dysfunction (impaired relaxation). 2. Right ventricular systolic function is normal. The right ventricular size is normal. There is normal pulmonary artery systolic pressure. The estimated right ventricular systolic pressure is 44.9 mmHg. 3. There is a trivial pericardial effusion anterior to the right ventricle.  4. The mitral valve is abnormal. Trivial mitral valve regurgitation. 5. The aortic valve is tricuspid. There is mild calcification of the aortic valve. Aortic valve regurgitation is not visualized. Mild aortic valve sclerosis is present, with no evidence of aortic valve stenosis. 6. The inferior vena cava is normal in size with greater than 50% respiratory variability, suggesting right atrial pressure of 3 mmHg.  Cardiac catheterization July 14, 2017 results:  Widely patent coronary arteries.  Right dominant anatomy. Luminal irregularities throughout the right coronary and up to 30% distal in the region of tortuosity.  Normal left main.  LAD is large and wraps around the left ventricular apex, containing only mild luminal irregularities.  Eccentric 50-60% stenosis in the mid circumflex but otherwise widely patent circumflex and branches. FFR 0.94 across the mid circumflex lesion.  Widely patent ramus intermedius with luminal irregularities  Global left ventricular hypokinesis with EF in the 40% range. Mildly elevated left ventricular end-diastolic pressure at 19 mmHg. 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  RECOMMENDATIONS:  Symptoms are atypical. He does have eccentric disease in the mid circumflex. It is not hemodynamically significant based upon an FFR of 0.94. Nevertheless would recommend antiplatelet therapy, aggressive  lipid-lowering, smoking cessation, and other risk modification as indicated.  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 Diagnostic Dominance: Right       Echocardiogram June 24, 2017 Study Conclusions  - Left ventricle: The cavity size was normal. Wall thickness was increased increased in a pattern of mild to moderate LVH. Systolic function was mildly reduced. The estimated ejection fraction was in the range of 45% to 50%. Doppler parameters are consistent with abnormal left ventricular relaxation (grade 1 diastolic dysfunction). - Aortic valve: Valve area (VTI): 1.97 cm^2. Valve area (Vmax): 2.04 cm^2. Valve area (Vmean): 2 cm^2. - Technically adequate study.  Assessment and Plan:   1.  Preoperative clearance Patient has pending left total hip replacement surgery.  Surgery will need to be deferred until further treatment for evidence of systolic heart failure (see recent echocardiogram) and abnormal stress test with continuing chest pain.  2. Atherosclerosis of native coronary artery of native heart without angina pectoris Patient had nonobstructive disease on cardiac catheterization December 2018.  Medical therapy and risk factor modification were recommended such as antiplatelet therapy, lipid management, smoking cessation.  See cardiac cath report above.  Recent episodes of chest pain relieved with nitroglycerin.  Recent abnormal stress test.  Continue aspirin 81 mg daily.  Continue atorvastatin 20 mg daily.  3.  Chest pain Continues to have anginal symptoms relieved with 2 nitroglycerin sublingual occurring mostly at rest.  He denies any radiation to neck, arm, back, jaw.  No associated symptoms..  Patient states approximately 1 month ago he had an episode which required 2 nitroglycerin for relief.  He had previously stopped his Imdur due to headaches.  Increase Imdur to 30 mg daily.   Continue atorvastatin 20 mg daily.  4. Mixed hyperlipidemia Continue Lipitor at 20 mg daily.  In 6 to 8 weeks get a follow-up fasting lipid profile and liver function test after initiation.    5. Tobacco abuse Patient states he is down to smoking 1 pack every 2 weeks.  Advised cessation   6.  Nonischemic cardiomyopathy Last echocardiogram in 2018 showed EF of 45 to 50% with mild to moderate LVH G1 DD.   Recent echocardiogram on 10/12/2020 demonstrated EF 30 to 35%, global hypokinesis.  Left  internal cavity size mildly dilated.  Moderate to severe asymmetric left ventricular hypertrophy of the septal segment.  Grade 1 diastolic dysfunction.  Trivial pericardial effusion anterior to right ventricle.  Trivial mitral regurgitation.  Start carvedilol 3.125 mg p.o. twice daily.  Get C-Met and CBC in 1 week.  Medication Adjustments/Labs and Tests Ordered: Current medicines are reviewed at length with the patient today.  Concerns regarding medicines are outlined above.   Disposition: Follow-up with Dr. Harl Bowie or APP 1 week Signed, Levell July, NP 10/15/2020 1:34 PM    [facility] [place], [place], [place] [address] Phone: [phone]; Fax: [phone]

## 2020-10-15 NOTE — Patient Instructions (Signed)
Medication Instructions:   Increase Imdur to 30mg  daily.  Begin Coreg 3.125mg  twice a day.  Continue all other medications.    Labwork:  CMET, CBC - orders given today.   Office will contact with results via phone or letter.    Testing/Procedures: none  Follow-Up: 1 week   Any Other Special Instructions Will Be Listed Below (If Applicable).  If you need a refill on your cardiac medications before your next appointment, please call your pharmacy.

## 2020-10-16 ENCOUNTER — Ambulatory Visit (HOSPITAL_COMMUNITY): Payer: Federal, State, Local not specified - PPO

## 2020-10-18 DIAGNOSIS — I5030 Unspecified diastolic (congestive) heart failure: Secondary | ICD-10-CM | POA: Diagnosis not present

## 2020-10-18 DIAGNOSIS — I208 Other forms of angina pectoris: Secondary | ICD-10-CM | POA: Diagnosis not present

## 2020-10-18 LAB — COMPLETE METABOLIC PANEL WITH GFR
AG Ratio: 2.2 (calc) (ref 1.0–2.5)
ALT: 11 U/L (ref 9–46)
AST: 10 U/L (ref 10–35)
Albumin: 4.4 g/dL (ref 3.6–5.1)
Alkaline phosphatase (APISO): 62 U/L (ref 35–144)
BUN/Creatinine Ratio: 15 (calc) (ref 6–22)
BUN: 23 mg/dL (ref 7–25)
CO2: 28 mmol/L (ref 20–32)
Calcium: 9.7 mg/dL (ref 8.6–10.3)
Chloride: 103 mmol/L (ref 98–110)
Creat: 1.49 mg/dL — ABNORMAL HIGH (ref 0.70–1.18)
GFR, Est African American: 54 mL/min/{1.73_m2} — ABNORMAL LOW (ref >=60)
GFR, Est Non African American: 47 mL/min/{1.73_m2} — ABNORMAL LOW (ref >=60)
Globulin: 2 g/dL (calc) (ref 1.9–3.7)
Glucose, Bld: 109 mg/dL — ABNORMAL HIGH (ref 65–99)
Potassium: 4.7 mmol/L (ref 3.5–5.3)
Sodium: 138 mmol/L (ref 135–146)
Total Bilirubin: 0.7 mg/dL (ref 0.2–1.2)
Total Protein: 6.4 g/dL (ref 6.1–8.1)

## 2020-10-18 LAB — CBC
HCT: 48.2 % (ref 38.5–50.0)
Hemoglobin: 15.9 g/dL (ref 13.2–17.1)
MCH: 30.5 pg (ref 27.0–33.0)
MCHC: 33 g/dL (ref 32.0–36.0)
MCV: 92.3 fL (ref 80.0–100.0)
MPV: 10.7 fL (ref 7.5–12.5)
Platelets: 276 10*3/uL (ref 140–400)
RBC: 5.22 10*6/uL (ref 4.20–5.80)
RDW: 12.8 % (ref 11.0–15.0)
WBC: 6.6 10*3/uL (ref 3.8–10.8)

## 2020-10-21 NOTE — Progress Notes (Addendum)
Cardiology Office Note  Date: 10/22/2020   ID: Craig Mccann, DOB 1951-08-03, MRN [id number]  PCP:  Celene Squibb, MD  Cardiologist:  Carlyle Dolly, MD Electrophysiologist:  None   Chief Complaint: Preop clearance for left total hip replacement surgery  History of Present Illness:  Craig Mccann is a 70 y.o. male. last encounter with Bernerd Pho, PA at [facility] on August 10, 2017.  Past medical history of CAD by prior CT, hypertension, hyperlipidemia, nonischemic cardiomyopathy with EF of 45 to 50% in November 2018, COPD, continued tobacco abuse.  He had been seen by Dr. Harl Bowie in December 2018 with reported episodes of chest pain occurring at rest and with activity with associated DOE.  Subsequent stress test was obtained showing moderate inferoseptal and mid inferior defect with peri-infarct ischemia.  He had a subsequent cardiac catheterization secondary to abnormal stress.  No interventions were performed.  He had nonobstructive disease but decreased EF estimated at 40%.  See cath report below.  Medical management and smoking cessation were recommended.    He continued with anginal pain radiating to his neck with no other associated symptoms  associated with and without exertion.  He stated 1 month prior he had an episode of chest pain eventually relieved after second dose of sublingual nitroglycerin.  Patient stated he was planning to take a third sublingual but pain was relieved   Patient had a presentation to [facility], ED on 04/18/2020.  Stated he had diagnosis of COVID on 04/10/2020.  He received monoclonal antibody infusion at [facility].  He presented to [facility], ER because he was feeling shortness of breath.  Also history of COPD   He was here last visit for preop clearance for left total hip replacement surgery.  He had been having episodes of intermittent chest pain which he described as occurring at rest and never with exertion.  He denied  radiation to neck, arm, back, jaw or associated nausea, vomiting, or diaphoresis.  He stated nitroglycerin sublingual usually relieved the pain.  History of nonischemic cardiomyopathy with last echo cardiogram in 2018 with EF of 45 to 50%.  G1 DD.  He denied any current anginal or exertional symptoms that day.  History of OSA but unable to tolerate CPAP.  Stated he slept on his side to help with his sleep disordered breathing.  He continued to smoke.  At some point he stopped his Imdur and Lipitor and did not have a current prescription for nitroglycerin as needed.  Stated he needed a refill.  Last visit he was here for follow-up on echocardiogram and stress test.  I reviewed both tests with him at length.  Advised him I had spoken to Dr. Harl Bowie due to the abnormalities on both the echocardiogram and stress test and advised elective hip surgery needed to be postponed and he needed to be optimized on medical therapy for systolic heart failure and considered for cardiac catheterization given prior history of CAD, decreased EF, and chest pain.  See stress and echo reports below.  Patient was somewhat upset that he was not able to proceed with his surgery.  He states he was having significant fatigue recently.  Denied any overt chest pain at this time.  He stated he wanted to go back to work but needs the hip surgery first.  Blood pressure was elevated on arrival 138/100.  Stated he smoked a cigarette earlier today.  Pulse rate was 98.  Previous blood pressure  on 09/27/2020 was 118/72.  Here for follow-up after starting carvedilol.  States he cannot tolerate it carvedilol it makes him feel tired and makes his head feel funny with headache.  He is very adamant about having something done right away regarding his hip he is frustrated and somewhat angry at the fact that he states he has had to wait a month.  Advised him I would contact Dr. Harl Bowie to expedite the cardiac catheterization so he can proceed with his  surgery.  He denies any anginal or exertional symptoms.  Denies any shortness of breath.  Continuing to smoke.  Blood pressure today 138/82.     Past Medical History:  Diagnosis Date  . Acute prostatitis 01/2015  . Allergy   . Basal cell carcinoma 2017   Multiple: back, left shin, left arm  . CAD (coronary artery disease)    a. 07/2017: cath showing 50-60% stenosis along the LCx with a FFR of 0.94 not being hemodynamically significant  . CAP (community acquired pneumonia) 06/26/2015  . Chronic renal insufficiency, stage II (mild)    stage II/II: CrCl 60s.  . Complication of anesthesia   . COPD (chronic obstructive pulmonary disease) ([place]) 05/07/15   Long time smoker + COPD changes noted on lung ca screening CT  . Diverticulitis   . Encounter for screening for lung cancer    Screening CT ok 08/27/16--repeat in 1 yr recommended.  . Family history of colon cancer in mother   . Hearing impairment    hearing aids  . History of adenomatous polyp of colon 12/24/10  . Hyperlipidemia 07/14/2016   started atorv 20 07/16/16  . Kidney stones    bilat nonobstrucing renal calculi 2016, + left benign renal cysts  . Melanoma ([place])    R posterolateral neck and R nasal ala.  Most recent was left mid back 01/2016 (Dr. Tarri Glenn).  [name] Spinal headache   . Tobacco dependence    CT chest for lung ca screning 04/2015 showed benign findings; 1 yr repeat recommended    Past Surgical History:  Procedure Laterality Date  . CHOLECYSTECTOMY  1987  . COLONOSCOPY  12/24/10; 2014   Multiple TCSs: pt on 5 yr recall, next due 2019.  [name] COLONOSCOPY N/A 10/09/2017   Procedure: COLONOSCOPY;  Surgeon: Danie Binder, MD;  [facility];  Service: Endoscopy;  Laterality: N/A;  1:30  . INGUINAL HERNIA REPAIR  1984  . INTRAVASCULAR PRESSURE WIRE/FFR STUDY N/A 07/14/2017   Procedure: INTRAVASCULAR PRESSURE WIRE/FFR STUDY;  Surgeon: Belva Crome, MD;  Location: [facility];  Service: Cardiovascular;   Laterality: N/A;  . JOINT REPLACEMENT     left  . KNEE SURGERY Left 2015   cartilage repair  . KNEE SURGERY Right 1997   "          "  . LEFT HEART CATH AND CORONARY ANGIOGRAPHY N/A 07/14/2017   Procedure: LEFT HEART CATH AND CORONARY ANGIOGRAPHY;  Surgeon: Belva Crome, MD;  Location: [facility];  Service: Cardiovascular;  Laterality: N/A;  . LITHOTRIPSY    . NECK SURGERY  1989  . Thumb surgery Bilateral 2005   and 2006  . TOTAL KNEE ARTHROPLASTY Left 04/23/2016   Procedure: TOTAL KNEE ARTHROPLASTY;  Surgeon: Ninetta Lights, MD;  Location: [facility];  Service: Orthopedics;  Laterality: Left;    Current Outpatient Medications  Medication Sig Dispense Refill  . albuterol (PROAIR HFA) 108 (90 BASE) MCG/ACT inhaler Inhale 2 puffs into the  lungs every 6 (six) hours as needed for wheezing or shortness of breath. 1 Inhaler 1  . aspirin EC 81 MG tablet Take 81 mg daily by mouth.    [name] atorvastatin (LIPITOR) 20 MG tablet Take 1 tablet (20 mg total) by mouth daily. 30 tablet 6  . isosorbide mononitrate (IMDUR) 30 MG 24 hr tablet Take 1 tablet (30 mg total) by mouth daily. 30 tablet 6  . losartan (COZAAR) 25 MG tablet Take 0.5 tablets (12.5 mg total) by mouth daily. 15 tablet 6  . nitroGLYCERIN (NITROSTAT) 0.4 MG SL tablet Place 1 tablet (0.4 mg total) under the tongue every 5 (five) minutes as needed for chest pain. 25 tablet 3   No current facility-administered medications for this visit.   Allergies:  Codeine, Lexiscan [regadenoson], and Valium [diazepam]   Social History: The patient  reports that he has been smoking cigarettes. He started smoking about 52 years ago. He has a 20.00 pack-year smoking history. He has never used smokeless tobacco. He reports that he does not drink alcohol and does not use drugs.   Family History: The patient's family history includes Brain cancer in his brother; Cancer in his brother, father, and mother; Colon cancer in his mother; Lung cancer in his  father.   ROS:  Please see the history of present illness. Otherwise, complete review of systems is positive for none.  All other systems are reviewed and negative.   Physical Exam: VS:  BP 138/82   Pulse 84   Ht 6\' 1"  (1.854 m)   Wt 256 lb (116.1 kg)   SpO2 93%   BMI 33.78 kg/m , BMI Body mass index is 33.78 kg/m.  Wt Readings from Last 3 Encounters:  10/22/20 256 lb (116.1 kg)  10/15/20 256 lb 12.8 oz (116.5 kg)  09/27/20 257 lb 3.2 oz (116.7 kg)    General: Patient appears comfortable at rest. Neck: Supple, no elevated JVP or carotid bruits, no thyromegaly. Lungs: Clear to auscultation, nonlabored breathing at rest. Cardiac: Regular rate and rhythm, no S3 or significant systolic murmur, no pericardial rub. Extremities: No pitting edema, distal pulses 2+. Skin: Warm and dry. Musculoskeletal: No kyphosis. Neuropsychiatric: Alert and oriented x3, affect grossly appropriate.  ECG:  An ECG dated 09/27/2020 was personally reviewed today and demonstrated:  Sinus rhythm with occasional premature ventricular complexes rate of 79, septal infarct, age undetermined.  Recent Labwork: 10/18/2020: ALT 11; AST 10; BUN 23; Creat 1.49; Hemoglobin 15.9; Platelets 276; Potassium 4.7; Sodium 138     Component Value Date/Time   CHOL 193 05/22/2017 1041   TRIG 87 05/22/2017 1041   HDL 41 05/22/2017 1041   CHOLHDL 4.7 05/22/2017 1041   VLDL 36.6 08/13/2016 1126   LDLCALC 133 (H) 05/22/2017 1041   LDLDIRECT 132.0 07/14/2016 1045    Other Studies Reviewed Today:    Cardiac catheterization 10/25/2020  LEFT HEART CATH AND CORONARY ANGIOGRAPHY    Conclusion    Ost 1st Diag lesion is 20% stenosed.  Dist RCA lesion is 30% stenosed.  Ramus lesion is 25% stenosed.  Prox Cx to Mid Cx lesion is 65% stenosed.  Mid RCA lesion is 15% stenosed.   Mild to moderate nonobstructive CAD with 20% irregularity in the first diagonal branch of the LAD, 20% stenosis in the ramus intermediate vessel,  smooth 20% proximal circumflex stenosis with no change in the previously noted 65% mid AV groove circumflex stenosis, and mild 15 and 30% smooth narrowings in a dominant RCA.  No  significant change since the prior catheterization of 2019.  LVEDP 15 mmHg.  RECOMMENDATION: Guideline directed medical therapy for nonischemic cardiomyopathy with reduced EF at 30 to 35%.  The patient will return to Dr. Harl Bowie.  Smoking cessation is essential.  Aggressive lipid-lowering therapy with target LDL less than 70  Diagnostic Dominance: Right              Nuclear stress test 10/12/2020 Study Result  Narrative & Impression   No diagnostic ST segment changes of her baseline to indicate ischemia. Patient developed high degree heart block following Lexiscan infusion. Aminophyllin was given with subsequent recovery and no persistent heart block or arrhythmia.  Would not utilize Lexiscan or adenosine for any future pharmacologic myocardial perfusion studies.  Medium sized, mild intensity, apical to basal inferior defect also involving the inferior septum and inferolateral wall at the base. This is fixed, most prominent on rest imaging, and therefore suggestive of soft tissue attenuation, although scar cannot be excluded. No large ischemic territories. Left ventricle is dilated (TID ratio normal).  This is a high risk study based predominantly on reduced LVEF.  Nuclear stress EF: 29%. Suggest echocardiography for corroboration.    Echocardiogram 10/12/2020 1. Left ventricular ejection fraction, by estimation, is 30 to 35%. The left ventricle has moderately decreased function. The left ventricle demonstrates global hypokinesis. The left ventricular internal cavity size was mildly dilated. There is moderate to severe asymmetric left ventricular hypertrophy of the septal segment. Left ventricular diastolic parameters are consistent with Grade I diastolic dysfunction (impaired relaxation). 2.  Right ventricular systolic function is normal. The right ventricular size is normal. There is normal pulmonary artery systolic pressure. The estimated right ventricular systolic pressure is 11.0 mmHg. 3. There is a trivial pericardial effusion anterior to the right ventricle. 4. The mitral valve is abnormal. Trivial mitral valve regurgitation. 5. The aortic valve is tricuspid. There is mild calcification of the aortic valve. Aortic valve regurgitation is not visualized. Mild aortic valve sclerosis is present, with no evidence of aortic valve stenosis. 6. The inferior vena cava is normal in size with greater than 50% respiratory variability, suggesting right atrial pressure of 3 mmHg.  Cardiac catheterization July 14, 2017 results:  Widely patent coronary arteries.  Right dominant anatomy. Luminal irregularities throughout the right coronary and up to 30% distal in the region of tortuosity.  Normal left main.  LAD is large and wraps around the left ventricular apex, containing only mild luminal irregularities.  Eccentric 50-60% stenosis in the mid circumflex but otherwise widely patent circumflex and branches. FFR 0.94 across the mid circumflex lesion.  Widely patent ramus intermedius with luminal irregularities  Global left ventricular hypokinesis with EF in the 40% range. Mildly elevated left ventricular end-diastolic pressure at 19 mmHg. 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  RECOMMENDATIONS:  Symptoms are atypical. He does have eccentric disease in the mid circumflex. It is not hemodynamically significant based upon an FFR of 0.94. Nevertheless would recommend antiplatelet therapy, aggressive lipid-lowering, smoking cessation, and other risk modification as indicated.  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 Diagnostic Dominance:  Right       Echocardiogram June 24, 2017 Study Conclusions  - Left ventricle: The cavity size was normal. Wall thickness was increased increased in a pattern of mild to moderate LVH. Systolic function was mildly reduced. The estimated ejection fraction was in the range of 45% to 50%. Doppler parameters are consistent with abnormal  left ventricular relaxation (grade 1 diastolic dysfunction). - Aortic valve: Valve area (VTI): 1.97 cm^2. Valve area (Vmax): 2.04 cm^2. Valve area (Vmean): 2 cm^2. - Technically adequate study.  Assessment and Plan:   1.  Preoperative clearance Patient has pending left total hip replacement surgery.  Recent cardiac catheterization showed mild to moderate nonobstructive CAD with no significant change since prior catheterization 2019.  Recommendation was guideline directed medical therapy for nonischemic cardiomyopathy with reduced EF but 30 to 35%.  Advised smoking cessation was essential.  Aggressive lipid-lowering therapy with target LDL less than 70.  Per Dr. Harl Bowie patient is cleared to undergo left total hip replacement surgery under general anesthesia from a cardiac standpoint.  RCRI score = 0 placing the patient perioperative risk of major cardiac event is 0.4%.  Please send to Dr. Caprice Kluver at office phone number [phone].  Fax [phone]   2. Atherosclerosis of native coronary artery of native heart without angina pectoris Patient had nonobstructive disease on cardiac catheterization December 2018.  Medical therapy and risk factor modification were recommended such as antiplatelet therapy, lipid management, smoking cessation.  See cardiac cath report above.  Recent episodes of chest pain relieved with nitroglycerin.  Recent abnormal stress test.  Continue aspirin 81 mg daily.  Continue atorvastatin 20 mg daily.  3.  Chest pain Had no chest pain today.  He states the medicine we previously gave him is causing him to have  headaches.  He had previously stopped his Imdur due to headaches so we decreased it to 15 mg.    Continue atorvastatin 20 mg daily.  4. Mixed hyperlipidemia Continue Lipitor at 20 mg daily.  In 6 to 8 weeks get a follow-up fasting lipid profile and liver function test after initiation.    5. Tobacco abuse Patient states he is down to smoking 1 pack every 2 weeks.  Advised cessation   6.  Nonischemic cardiomyopathy Last echocardiogram in 2018 showed EF of 45 to 50% with mild to moderate LVH G1 DD.   Recent echocardiogram on 10/12/2020 demonstrated EF 30 to 35%, global hypokinesis.  Left internal cavity size mildly dilated.  Moderate to severe asymmetric left ventricular hypertrophy of the septal segment.  Grade 1 diastolic dysfunction.  Trivial pericardial effusion anterior to right ventricle.  Trivial mitral regurgitation.  Stop carvedilol due to patient's complaint of side effects.  Start losartan 12.5 mg daily.  Advised patient I will contact Dr. Harl Bowie immediately regarding scheduling him for cardiac catheterization.  He is very upset that it has taken a month to attempt to adjust his medications.  Advised him I will contact Dr. Harl Bowie.  Medication Adjustments/Labs and Tests Ordered: Current medicines are reviewed at length with the patient today.  Concerns regarding medicines are outlined above.   Disposition: Follow-up with Dr. Harl Bowie or [facility].  Appointment will be pending depending on cardiac catheterization being scheduled.  Signed, Levell July, NP 10/22/2020 10:45 AM    [facility], [place], [place] [address] Phone: [phone]; Fax: [phone]

## 2020-10-21 NOTE — H&P (View-Only) (Signed)
Cardiology Office Note  Date: 10/22/2020   ID: Craig Mccann, DOB 03-22-1951, MRN [id number]  PCP:  Celene Squibb, MD  Cardiologist:  Carlyle Dolly, MD Electrophysiologist:  None   Chief Complaint: Preop clearance for left total hip replacement surgery  History of Present Illness:  Craig Mccann is a 70 y.o. male. last encounter with Bernerd Pho, PA at [facility] on August 10, 2017.  Past medical history of CAD by prior CT, hypertension, hyperlipidemia, nonischemic cardiomyopathy with EF of 45 to 50% in November 2018, COPD, continued tobacco abuse.  He had been seen by Dr. Harl Bowie in December 2018 with reported episodes of chest pain occurring at rest and with activity with associated DOE.  Subsequent stress test was obtained showing moderate inferoseptal and mid inferior defect with peri-infarct ischemia.  He had a subsequent cardiac catheterization secondary to abnormal stress.  No interventions were performed.  He had nonobstructive disease but decreased EF estimated at 40%.  See cath report below.  Medical management and smoking cessation were recommended.    He continued with anginal pain radiating to his neck with no other associated symptoms  associated with and without exertion.  He stated 1 month prior he had an episode of chest pain eventually relieved after second dose of sublingual nitroglycerin.  Patient stated he was planning to take a third sublingual but pain was relieved   Patient had a presentation to [facility] ED on 04/18/2020.  Stated he had diagnosis of COVID on 04/10/2020.  He received monoclonal antibody infusion at [facility].  He presented to [facility], ER because he was feeling shortness of breath.  Also history of COPD   He was here last visit for preop clearance for left total hip replacement surgery.  He had been having episodes of intermittent chest pain which he described as occurring at rest and never with exertion.  He denied  radiation to neck, arm, back, jaw or associated nausea, vomiting, or diaphoresis.  He stated nitroglycerin sublingual usually relieved the pain.  History of nonischemic cardiomyopathy with last echo cardiogram in 2018 with EF of 45 to 50%.  G1 DD.  He denied any current anginal or exertional symptoms that day.  History of OSA but unable to tolerate CPAP.  Stated he slept on his side to help with his sleep disordered breathing.  He continued to smoke.  At some point he stopped his Imdur and Lipitor and did not have a current prescription for nitroglycerin as needed.  Stated he needed a refill.  Last visit he was here for follow-up on echocardiogram and stress test.  I reviewed both tests with him at length.  Advised him I had spoken to Dr. Harl Bowie due to the abnormalities on both the echocardiogram and stress test and advised elective hip surgery needed to be postponed and he needed to be optimized on medical therapy for systolic heart failure and considered for cardiac catheterization given prior history of CAD, decreased EF, and chest pain.  See stress and echo reports below.  Patient was somewhat upset that he was not able to proceed with his surgery.  He states he was having significant fatigue recently.  Denied any overt chest pain at this time.  He stated he wanted to go back to work but needs the hip surgery first.  Blood pressure was elevated on arrival 138/100.  Stated he smoked a cigarette earlier today.  Pulse rate was 98.  Previous blood pressure  on 09/27/2020 was 118/72.  Here for follow-up after starting carvedilol.  States he cannot tolerate it carvedilol it makes him feel tired and makes his head feel funny with headache.  He is very adamant about having something done right away regarding his hip he is frustrated and somewhat angry at the fact that he states he has had to wait a month.  Advised him I would contact Dr. Harl Bowie to expedite the cardiac catheterization so he can proceed with his  surgery.  He denies any anginal or exertional symptoms.  Denies any shortness of breath.  Continuing to smoke.  Blood pressure today 138/82.   Past Medical History:  Diagnosis Date  . Acute prostatitis 01/2015  . Allergy   . Basal cell carcinoma 2017   Multiple: back, left shin, left arm  . CAD (coronary artery disease)    a. 07/2017: cath showing 50-60% stenosis along the LCx with a FFR of 0.94 not being hemodynamically significant  . CAP (community acquired pneumonia) 06/26/2015  . Chronic renal insufficiency, stage II (mild)    stage II/II: CrCl 60s.  . Complication of anesthesia   . COPD (chronic obstructive pulmonary disease) ([place]) 05/07/15   Long time smoker + COPD changes noted on lung ca screening CT  . Diverticulitis   . Encounter for screening for lung cancer    Screening CT ok 08/27/16--repeat in 1 yr recommended.  . Family history of colon cancer in mother   . Hearing impairment    hearing aids  . History of adenomatous polyp of colon 12/24/10  . Hyperlipidemia 07/14/2016   started atorv 20 07/16/16  . Kidney stones    bilat nonobstrucing renal calculi 2016, + left benign renal cysts  . Melanoma ([place])    R posterolateral neck and R nasal ala.  Most recent was left mid back 01/2016 (Dr. Tarri Glenn).  [name] Spinal headache   . Tobacco dependence    CT chest for lung ca screning 04/2015 showed benign findings; 1 yr repeat recommended    Past Surgical History:  Procedure Laterality Date  . CHOLECYSTECTOMY  1987  . COLONOSCOPY  12/24/10; 2014   Multiple TCSs: pt on 5 yr recall, next due 2019.  [name] COLONOSCOPY N/A 10/09/2017   Procedure: COLONOSCOPY;  Surgeon: Danie Binder, MD;  [facility];  Service: Endoscopy;  Laterality: N/A;  1:30  . INGUINAL HERNIA REPAIR  1984  . INTRAVASCULAR PRESSURE WIRE/FFR STUDY N/A 07/14/2017   Procedure: INTRAVASCULAR PRESSURE WIRE/FFR STUDY;  Surgeon: Belva Crome, MD;  Location: [facility];  Service: Cardiovascular;   Laterality: N/A;  . JOINT REPLACEMENT     left  . KNEE SURGERY Left 2015   cartilage repair  . KNEE SURGERY Right 1997   "          "  . LEFT HEART CATH AND CORONARY ANGIOGRAPHY N/A 07/14/2017   Procedure: LEFT HEART CATH AND CORONARY ANGIOGRAPHY;  Surgeon: Belva Crome, MD;  Location: [facility];  Service: Cardiovascular;  Laterality: N/A;  . LITHOTRIPSY    . NECK SURGERY  1989  . Thumb surgery Bilateral 2005   and 2006  . TOTAL KNEE ARTHROPLASTY Left 04/23/2016   Procedure: TOTAL KNEE ARTHROPLASTY;  Surgeon: Ninetta Lights, MD;  Location: [facility];  Service: Orthopedics;  Laterality: Left;    Current Outpatient Medications  Medication Sig Dispense Refill  . albuterol (PROAIR HFA) 108 (90 BASE) MCG/ACT inhaler Inhale 2 puffs into the lungs every  6 (six) hours as needed for wheezing or shortness of breath. 1 Inhaler 1  . aspirin EC 81 MG tablet Take 81 mg daily by mouth.    [name] atorvastatin (LIPITOR) 20 MG tablet Take 1 tablet (20 mg total) by mouth daily. 30 tablet 6  . isosorbide mononitrate (IMDUR) 30 MG 24 hr tablet Take 1 tablet (30 mg total) by mouth daily. 30 tablet 6  . losartan (COZAAR) 25 MG tablet Take 0.5 tablets (12.5 mg total) by mouth daily. 15 tablet 6  . nitroGLYCERIN (NITROSTAT) 0.4 MG SL tablet Place 1 tablet (0.4 mg total) under the tongue every 5 (five) minutes as needed for chest pain. 25 tablet 3   No current facility-administered medications for this visit.   Allergies:  Codeine, Lexiscan [regadenoson], and Valium [diazepam]   Social History: The patient  reports that he has been smoking cigarettes. He started smoking about 52 years ago. He has a 20.00 pack-year smoking history. He has never used smokeless tobacco. He reports that he does not drink alcohol and does not use drugs.   Family History: The patient's family history includes Brain cancer in his brother; Cancer in his brother, father, and mother; Colon cancer in his mother; Lung cancer in his  father.   ROS:  Please see the history of present illness. Otherwise, complete review of systems is positive for none.  All other systems are reviewed and negative.   Physical Exam: VS:  BP 138/82   Pulse 84   Ht 6\' 1"  (1.854 m)   Wt 256 lb (116.1 kg)   SpO2 93%   BMI 33.78 kg/m , BMI Body mass index is 33.78 kg/m.  Wt Readings from Last 3 Encounters:  10/22/20 256 lb (116.1 kg)  10/15/20 256 lb 12.8 oz (116.5 kg)  09/27/20 257 lb 3.2 oz (116.7 kg)    General: Patient appears comfortable at rest. Neck: Supple, no elevated JVP or carotid bruits, no thyromegaly. Lungs: Clear to auscultation, nonlabored breathing at rest. Cardiac: Regular rate and rhythm, no S3 or significant systolic murmur, no pericardial rub. Extremities: No pitting edema, distal pulses 2+. Skin: Warm and dry. Musculoskeletal: No kyphosis. Neuropsychiatric: Alert and oriented x3, affect grossly appropriate.  ECG:  An ECG dated 09/27/2020 was personally reviewed today and demonstrated:  Sinus rhythm with occasional premature ventricular complexes rate of 79, septal infarct, age undetermined.  Recent Labwork: 10/18/2020: ALT 11; AST 10; BUN 23; Creat 1.49; Hemoglobin 15.9; Platelets 276; Potassium 4.7; Sodium 138     Component Value Date/Time   CHOL 193 05/22/2017 1041   TRIG 87 05/22/2017 1041   HDL 41 05/22/2017 1041   CHOLHDL 4.7 05/22/2017 1041   VLDL 36.6 08/13/2016 1126   LDLCALC 133 (H) 05/22/2017 1041   LDLDIRECT 132.0 07/14/2016 1045    Other Studies Reviewed Today:   Nuclear stress test 10/12/2020 Study Result  Narrative & Impression   No diagnostic ST segment changes of her baseline to indicate ischemia. Patient developed high degree heart block following Lexiscan infusion. Aminophyllin was given with subsequent recovery and no persistent heart block or arrhythmia.  Would not utilize Lexiscan or adenosine for any future pharmacologic myocardial perfusion studies.  Medium sized, mild  intensity, apical to basal inferior defect also involving the inferior septum and inferolateral wall at the base. This is fixed, most prominent on rest imaging, and therefore suggestive of soft tissue attenuation, although scar cannot be excluded. No large ischemic territories. Left ventricle is dilated (TID ratio normal).  This  is a high risk study based predominantly on reduced LVEF.  Nuclear stress EF: 29%. Suggest echocardiography for corroboration.    Echocardiogram 10/12/2020 1. Left ventricular ejection fraction, by estimation, is 30 to 35%. The left ventricle has moderately decreased function. The left ventricle demonstrates global hypokinesis. The left ventricular internal cavity size was mildly dilated. There is moderate to severe asymmetric left ventricular hypertrophy of the septal segment. Left ventricular diastolic parameters are consistent with Grade I diastolic dysfunction (impaired relaxation). 2. Right ventricular systolic function is normal. The right ventricular size is normal. There is normal pulmonary artery systolic pressure. The estimated right ventricular systolic pressure is 03.4 mmHg. 3. There is a trivial pericardial effusion anterior to the right ventricle. 4. The mitral valve is abnormal. Trivial mitral valve regurgitation. 5. The aortic valve is tricuspid. There is mild calcification of the aortic valve. Aortic valve regurgitation is not visualized. Mild aortic valve sclerosis is present, with no evidence of aortic valve stenosis. 6. The inferior vena cava is normal in size with greater than 50% respiratory variability, suggesting right atrial pressure of 3 mmHg.  Cardiac catheterization July 14, 2017 results:  Widely patent coronary arteries.  Right dominant anatomy. Luminal irregularities throughout the right coronary and up to 30% distal in the region of tortuosity.  Normal left main.  LAD is large and wraps around the left ventricular apex,  containing only mild luminal irregularities.  Eccentric 50-60% stenosis in the mid circumflex but otherwise widely patent circumflex and branches. FFR 0.94 across the mid circumflex lesion.  Widely patent ramus intermedius with luminal irregularities  Global left ventricular hypokinesis with EF in the 40% range. Mildly elevated left ventricular end-diastolic pressure at 19 mmHg. 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  RECOMMENDATIONS:  Symptoms are atypical. He does have eccentric disease in the mid circumflex. It is not hemodynamically significant based upon an FFR of 0.94. Nevertheless would recommend antiplatelet therapy, aggressive lipid-lowering, smoking cessation, and other risk modification as indicated.  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 Diagnostic Dominance: Right       Echocardiogram June 24, 2017 Study Conclusions  - Left ventricle: The cavity size was normal. Wall thickness was increased increased in a pattern of mild to moderate LVH. Systolic function was mildly reduced. The estimated ejection fraction was in the range of 45% to 50%. Doppler parameters are consistent with abnormal left ventricular relaxation (grade 1 diastolic dysfunction). - Aortic valve: Valve area (VTI): 1.97 cm^2. Valve area (Vmax): 2.04 cm^2. Valve area (Vmean): 2 cm^2. - Technically adequate study.  Assessment and Plan:   1.  Preoperative clearance Patient has pending left total hip replacement surgery.  Surgery will need to be deferred until further treatment for evidence of systolic heart failure (see recent echocardiogram) and abnormal stress test with continuing chest pain.  2. Atherosclerosis of native coronary artery of native heart without angina pectoris Patient had nonobstructive disease on cardiac catheterization December 2018.  Medical  therapy and risk factor modification were recommended such as antiplatelet therapy, lipid management, smoking cessation.  See cardiac cath report above.  Recent episodes of chest pain relieved with nitroglycerin.  Recent abnormal stress test.  Continue aspirin 81 mg daily.  Continue atorvastatin 20 mg daily.  3.  Chest pain He was no chest pain today.  He states the medicine we previously gave him is causing him to have headaches.  He had previously stopped his Imdur due to headaches  so we decreased it to 15 mg.  At last visit we increased Imdur to 30 mg daily.  Today he is complaining about headaches.  Advised him he could decrease the Imdur to 15 mg daily again.  Continue atorvastatin 20 mg daily.  4. Mixed hyperlipidemia Continue Lipitor at 20 mg daily.  In 6 to 8 weeks get a follow-up fasting lipid profile and liver function test after initiation.    5. Tobacco abuse Patient states he is down to smoking 1 pack every 2 weeks.  Advised cessation   6.  Nonischemic cardiomyopathy Last echocardiogram in 2018 showed EF of 45 to 50% with mild to moderate LVH G1 DD.   Recent echocardiogram on 10/12/2020 demonstrated EF 30 to 35%, global hypokinesis.  Left internal cavity size mildly dilated.  Moderate to severe asymmetric left ventricular hypertrophy of the septal segment.  Grade 1 diastolic dysfunction.  Trivial pericardial effusion anterior to right ventricle.  Trivial mitral regurgitation.  Stop carvedilol due to patient's complaint of side effects.  Start losartan 12.5 mg daily.  Advised patient I will contact Dr. Harl Bowie immediately regarding scheduling him for cardiac catheterization.  He is very upset that it has taken a month to attempt to adjust his medications.  Advised him I will contact Dr. Harl Bowie.  Medication Adjustments/Labs and Tests Ordered: Current medicines are reviewed at length with the patient today.  Concerns regarding medicines are outlined above.   Disposition: Follow-up with Dr.  Harl Bowie or [facility].  Appointment will be pending depending on cardiac catheterization being scheduled.  Signed, Levell July, NP 10/22/2020 10:45 AM    [facility], [place], [place] [address] Phone: [phone]; Fax: [phone]

## 2020-10-22 ENCOUNTER — Encounter: Payer: Self-pay | Admitting: Family Medicine

## 2020-10-22 ENCOUNTER — Ambulatory Visit: Payer: Medicare HMO | Admitting: Family Medicine

## 2020-10-22 ENCOUNTER — Telehealth: Payer: Self-pay | Admitting: Family Medicine

## 2020-10-22 ENCOUNTER — Telehealth: Payer: Self-pay | Admitting: *Deleted

## 2020-10-22 ENCOUNTER — Encounter: Payer: Self-pay | Admitting: *Deleted

## 2020-10-22 VITALS — BP 138/82 | HR 84 | Ht 73.0 in | Wt 256.0 lb

## 2020-10-22 DIAGNOSIS — I208 Other forms of angina pectoris: Secondary | ICD-10-CM

## 2020-10-22 DIAGNOSIS — E782 Mixed hyperlipidemia: Secondary | ICD-10-CM

## 2020-10-22 DIAGNOSIS — Z720 Tobacco use: Secondary | ICD-10-CM

## 2020-10-22 DIAGNOSIS — I428 Other cardiomyopathies: Secondary | ICD-10-CM

## 2020-10-22 DIAGNOSIS — I2510 Atherosclerotic heart disease of native coronary artery without angina pectoris: Secondary | ICD-10-CM

## 2020-10-22 DIAGNOSIS — Z01818 Encounter for other preprocedural examination: Secondary | ICD-10-CM | POA: Diagnosis not present

## 2020-10-22 MED ORDER — LOSARTAN POTASSIUM 25 MG PO TABS: 12.5000 mg | ORAL_TABLET | Freq: Every day | ORAL | 6 refills | Status: DC

## 2020-10-22 NOTE — Telephone Encounter (Signed)
Laurine Blazer, LPN  7/78/2423 53:61 AM EDT Back to Top     Results discussed during OV by provider. Copy to pcp.

## 2020-10-22 NOTE — Telephone Encounter (Signed)
ECHO -   Verta Ellen., NP  10/15/2020 8:09 AM EDT      Echocardiogram showed decreased pumping function of the heart when compared to the 2018 echo. Also showed some decreased motion in the left ventricle and some increased size of the muscle separating the 2 bottom chambers of his heart. Also heart muscle was a little stiff and had problems relaxing when trying to fill. Will consult with Dr. Harl Bowie as to whether he can proceed with surgery or we need to do some further investigation. His stress test showed calculated EF of 29%. But no major ischemic territories. I will CC this to Dr. Harl Bowie for further direction on whether he can proceed with surgery or not.   STRESS TEST -    Verta Ellen., NP  10/15/2020 8:04 AM EDT      Nuclear stress test did not show any large ischemic territories but was considered high risk due to ejection fraction of 29%. (Decreased pumping function.)   LABS -    Verta Ellen., NP  10/18/2020 4:14 PM EDT      CBC looks good. No signs of infection or anemia blood work. Blood sugar is slightly elevated at 109. Kidney function is about the same as it was back in July 2019. No major changes

## 2020-10-22 NOTE — Telephone Encounter (Signed)
Pre-cert Verification for the following procedure     Left heart cath scheduled for -   Thursday, 10/25/2020 at 7:30 am with Dr. Claiborne Billings.

## 2020-10-22 NOTE — Patient Instructions (Signed)
Medication Instructions:   Stop Coreg.    Begin Losartan 12.5mg  daily.   Continue all other medications.    Labwork: none  Testing/Procedures: none  Follow-Up: Pending   Any Other Special Instructions Will Be Listed Below (If Applicable).  If you need a refill on your cardiac medications before your next appointment, please call your pharmacy.

## 2020-10-23 ENCOUNTER — Other Ambulatory Visit: Payer: Self-pay

## 2020-10-23 ENCOUNTER — Other Ambulatory Visit (HOSPITAL_COMMUNITY)
Admission: RE | Admit: 2020-10-23 | Discharge: 2020-10-23 | Disposition: A | Discharge status: Home / Self Care | Payer: Medicare HMO | Source: Ambulatory Visit | Attending: Cardiovascular Disease | Admitting: Cardiovascular Disease

## 2020-10-23 DIAGNOSIS — Z01812 Encounter for preprocedural laboratory examination: Secondary | ICD-10-CM | POA: Insufficient documentation

## 2020-10-23 DIAGNOSIS — Z20822 Contact with and (suspected) exposure to covid-19: Secondary | ICD-10-CM | POA: Insufficient documentation

## 2020-10-23 LAB — SARS CORONAVIRUS 2 (TAT 6-24 HRS): SARS Coronavirus 2: NEGATIVE

## 2020-10-24 ENCOUNTER — Telehealth: Payer: Self-pay | Admitting: *Deleted

## 2020-10-24 NOTE — Telephone Encounter (Addendum)
Pt contacted pre-catheterization scheduled at [facility] for: Thursday October 25, 2020 7:30 AM Verified arrival time and place: [facility]) at: 5:30 AM   No solid food after midnight prior to cath, clear liquids until 5 AM day of procedure.  Hold: Losartan-day before and day  procedure-GFR 47  Except hold medications AM meds can be  taken pre-cath with sips of water including: ASA 81 mg   Confirmed patient has responsible adult to drive home post procedure and be with patient first 24 hours after arriving home: yes  You are allowed ONE visitor in the waiting room during the time you are at the hospital for your procedure. Both you and your visitor must wear a mask once you enter the hospital.     Reviewed procedure/mask/visitor instructions with patient.

## 2020-10-24 NOTE — Telephone Encounter (Signed)
No answer, voicemail message. 

## 2020-10-25 ENCOUNTER — Ambulatory Visit (HOSPITAL_COMMUNITY)
Admission: RE | Disposition: A | Discharge status: Home / Self Care | Payer: Self-pay | Source: Home / Self Care | Attending: Cardiovascular Disease

## 2020-10-25 ENCOUNTER — Ambulatory Visit (HOSPITAL_COMMUNITY)
Admission: RE | Admit: 2020-10-25 | Discharge: 2020-10-25 | Disposition: A | Discharge status: Home / Self Care | Payer: Medicare HMO | Attending: Cardiovascular Disease | Admitting: Cardiovascular Disease

## 2020-10-25 ENCOUNTER — Other Ambulatory Visit: Payer: Self-pay

## 2020-10-25 DIAGNOSIS — Z79899 Other long term (current) drug therapy: Secondary | ICD-10-CM | POA: Insufficient documentation

## 2020-10-25 DIAGNOSIS — Z885 Allergy status to narcotic agent status: Secondary | ICD-10-CM | POA: Diagnosis not present

## 2020-10-25 DIAGNOSIS — R931 Abnormal findings on diagnostic imaging of heart and coronary circulation: Secondary | ICD-10-CM | POA: Diagnosis not present

## 2020-10-25 DIAGNOSIS — Z888 Allergy status to other drugs, medicaments and biological substances status: Secondary | ICD-10-CM | POA: Insufficient documentation

## 2020-10-25 DIAGNOSIS — R69 Illness, unspecified: Secondary | ICD-10-CM | POA: Diagnosis not present

## 2020-10-25 DIAGNOSIS — E782 Mixed hyperlipidemia: Secondary | ICD-10-CM | POA: Diagnosis not present

## 2020-10-25 DIAGNOSIS — Z7982 Long term (current) use of aspirin: Secondary | ICD-10-CM | POA: Diagnosis not present

## 2020-10-25 DIAGNOSIS — I428 Other cardiomyopathies: Secondary | ICD-10-CM | POA: Diagnosis not present

## 2020-10-25 DIAGNOSIS — I25119 Atherosclerotic heart disease of native coronary artery with unspecified angina pectoris: Secondary | ICD-10-CM

## 2020-10-25 DIAGNOSIS — I2510 Atherosclerotic heart disease of native coronary artery without angina pectoris: Secondary | ICD-10-CM | POA: Diagnosis not present

## 2020-10-25 DIAGNOSIS — I429 Cardiomyopathy, unspecified: Secondary | ICD-10-CM

## 2020-10-25 DIAGNOSIS — F17210 Nicotine dependence, cigarettes, uncomplicated: Secondary | ICD-10-CM | POA: Diagnosis not present

## 2020-10-25 HISTORY — PX: LEFT HEART CATH AND CORONARY ANGIOGRAPHY: CATH118249

## 2020-10-25 SURGERY — LEFT HEART CATH AND CORONARY ANGIOGRAPHY
Anesthesia: LOCAL

## 2020-10-25 MED ORDER — SODIUM CHLORIDE 0.9 % IV SOLN: INTRAVENOUS | Status: DC

## 2020-10-25 MED ORDER — SODIUM CHLORIDE 0.9% FLUSH: 3.0000 mL | INTRAVENOUS | Status: DC | PRN

## 2020-10-25 MED ORDER — SODIUM CHLORIDE 0.9 % WEIGHT BASED INFUSION
3.0000 mL/kg/h | INTRAVENOUS | Status: DC
  Administered 2020-10-25: 3 mL/kg/h via INTRAVENOUS

## 2020-10-25 MED ORDER — SODIUM CHLORIDE 0.9% FLUSH: 3.0000 mL | Freq: Two times a day (BID) | INTRAVENOUS | Status: DC

## 2020-10-25 MED ORDER — HEPARIN (PORCINE) IN NACL 1000-0.9 UT/500ML-% IV SOLN
INTRAVENOUS | Status: AC
  Filled 2020-10-25: qty 500

## 2020-10-25 MED ORDER — LIDOCAINE HCL (PF) 1 % IJ SOLN
INTRAMUSCULAR | Status: DC | PRN
  Administered 2020-10-25: 20 mL

## 2020-10-25 MED ORDER — FENTANYL CITRATE (PF) 100 MCG/2ML IJ SOLN
INTRAMUSCULAR | Status: DC | PRN
  Administered 2020-10-25: 50 ug via INTRAVENOUS

## 2020-10-25 MED ORDER — SODIUM CHLORIDE 0.9 % WEIGHT BASED INFUSION: 1.0000 mL/kg/h | INTRAVENOUS | Status: DC

## 2020-10-25 MED ORDER — IOHEXOL 350 MG/ML SOLN
INTRAVENOUS | Status: DC | PRN
  Administered 2020-10-25: 50 mL

## 2020-10-25 MED ORDER — LIDOCAINE HCL (PF) 1 % IJ SOLN
INTRAMUSCULAR | Status: AC
  Filled 2020-10-25: qty 30

## 2020-10-25 MED ORDER — ACETAMINOPHEN 325 MG PO TABS: 650.0000 mg | ORAL_TABLET | ORAL | Status: DC | PRN

## 2020-10-25 MED ORDER — LABETALOL HCL 5 MG/ML IV SOLN: 10.0000 mg | INTRAVENOUS | Status: DC | PRN

## 2020-10-25 MED ORDER — ASPIRIN 81 MG PO CHEW: 81.0000 mg | CHEWABLE_TABLET | ORAL | Status: DC

## 2020-10-25 MED ORDER — ATORVASTATIN CALCIUM 40 MG PO TABS
40.0000 mg | ORAL_TABLET | Freq: Every day | ORAL | Status: DC
  Filled 2020-10-25: qty 1

## 2020-10-25 MED ORDER — ONDANSETRON HCL 4 MG/2ML IJ SOLN: 4.0000 mg | Freq: Four times a day (QID) | INTRAMUSCULAR | Status: DC | PRN

## 2020-10-25 MED ORDER — ASPIRIN 81 MG PO CHEW: 81.0000 mg | CHEWABLE_TABLET | Freq: Every day | ORAL | Status: DC

## 2020-10-25 MED ORDER — SODIUM CHLORIDE 0.9 % IV SOLN: 250.0000 mL | INTRAVENOUS | Status: DC | PRN

## 2020-10-25 MED ORDER — HYDRALAZINE HCL 20 MG/ML IJ SOLN
10.0000 mg | INTRAMUSCULAR | Status: DC | PRN
Start: 2020-10-25 — End: 2020-10-25

## 2020-10-25 MED ORDER — FENTANYL CITRATE (PF) 100 MCG/2ML IJ SOLN
INTRAMUSCULAR | Status: AC
  Filled 2020-10-25: qty 2

## 2020-10-25 SURGICAL SUPPLY — 8 items
CATH INFINITI 5FR MULTPACK ANG (CATHETERS) ×1 IMPLANT
KIT HEART LEFT (KITS) ×2 IMPLANT
PACK CARDIAC CATHETERIZATION (CUSTOM PROCEDURE TRAY) ×2 IMPLANT
SHEATH PINNACLE 5F 10CM (SHEATH) ×1 IMPLANT
SHEATH PROBE COVER 6X72 (BAG) ×1 IMPLANT
TRANSDUCER W/STOPCOCK (MISCELLANEOUS) ×2 IMPLANT
TUBING CIL FLEX 10 FLL-RA (TUBING) ×2 IMPLANT
WIRE EMERALD 3MM-J .035X150CM (WIRE) ×1 IMPLANT

## 2020-10-25 NOTE — Progress Notes (Signed)
Site area: Right groin a 5 french arterial sheath was removed  Site Prior to Removal:  Level 0  Pressure Applied For 20 MINUTES    Bedrest Beginning at 0855am x 4 hours  Manual:   Yes.    Patient Status During Pull:  stable  Post Pull Groin Site:  Level 0  Post Pull Instructions Given:  Yes.    Post Pull Pulses Present:  Yes.    Dressing Applied:  Yes.    Comments:

## 2020-10-25 NOTE — Interval H&P Note (Signed)
Cath Lab Visit (complete for each Cath Lab visit)  Clinical Evaluation Leading to the Procedure:   ACS: No.  Non-ACS:    Anginal Classification: CCS III  Anti-ischemic medical therapy: Maximal Therapy (2 or more classes of medications)  Non-Invasive Test Results: High-risk stress test findings: cardiac mortality >3%/year  Prior CABG: No previous CABG      History and Physical Interval Note:  10/25/2020 7:42 AM  Craig Mccann  has presented today for surgery, with the diagnosis of abnormal stress test.  The various methods of treatment have been discussed with the patient and family. After consideration of risks, benefits and other options for treatment, the patient has consented to  Procedure(s): LEFT HEART CATH AND CORONARY ANGIOGRAPHY (N/A) as a surgical intervention.  The patient's history has been reviewed, patient examined, no change in status, stable for surgery.  I have reviewed the patient's chart and labs.  Questions were answered to the patient's satisfaction.     Craig Mccann

## 2020-10-25 NOTE — Discharge Instructions (Signed)
Femoral Site Care  This sheet gives you information about how to care for yourself after your procedure. Your health care provider may also give you more specific instructions. If you have problems or questions, contact your health care provider. What can I expect after the procedure? After the procedure, it is common to have:  Bruising that usually fades within 1-2 weeks.  Tenderness at the site. Follow these instructions at home: Wound care  Follow instructions from your health care provider about how to take care of your insertion site. Make sure you: ? Wash your hands with soap and water before you change your bandage (dressing). If soap and water are not available, use hand sanitizer. ? Change your dressing as told by your health care provider. ? Leave stitches (sutures), skin glue, or adhesive strips in place. These skin closures may need to stay in place for 2 weeks or longer. If adhesive strip edges start to loosen and curl up, you may trim the loose edges. Do not remove adhesive strips completely unless your health care provider tells you to do that.  Do not take baths, swim, or use a hot tub until your health care provider approves.  You may shower 24-48 hours after the procedure or as told by your health care provider. ? Gently wash the site with plain soap and water. ? Pat the area dry with a clean towel. ? Do not rub the site. This may cause bleeding.  Do not apply powder or lotion to the site. Keep the site clean and dry.  Check your femoral site every day for signs of infection. Check for: ? Redness, swelling, or pain. ? Fluid or blood. ? Warmth. ? Pus or a bad smell. Activity  For the first 2-3 days after your procedure, or as long as directed: ? Avoid climbing stairs as much as possible. ? Do not squat.  Do not lift anything that is heavier than 10 lb (4.5 kg), or the limit that you are told, until your health care provider says that it is safe.  Rest as  directed. ? Avoid sitting for a long time without moving. Get up to take short walks every 1-2 hours.  Do not drive for 24 hours if you were given a medicine to help you relax (sedative). General instructions  Take over-the-counter and prescription medicines only as told by your health care provider.  Keep all follow-up visits as told by your health care provider. This is important. Contact a health care provider if you have:  A fever or chills.  You have redness, swelling, or pain around your insertion site. Get help right away if:  The catheter insertion area swells very fast.  You pass out.  You suddenly start to sweat or your skin gets clammy.  The catheter insertion area is bleeding, and the bleeding does not stop when you hold steady pressure on the area.  The area near or just beyond the catheter insertion site becomes pale, cool, tingly, or numb. These symptoms may represent a serious problem that is an emergency. Do not wait to see if the symptoms will go away. Get medical help right away. Call your local emergency services (911 in the [place] Do not drive yourself to the hospital. Summary  After the procedure, it is common to have bruising that usually fades within 1-2 weeks.  Check your femoral site every day for signs of infection.  Do not lift anything that is heavier than 10 lb (4.5 kg), or   the limit that you are told, until your health care provider says that it is safe. This information is not intended to replace advice given to you by your health care provider. Make sure you discuss any questions you have with your health care provider. Document Revised: 03/23/2020 Document Reviewed: 03/23/2020 Elsevier Patient Education  2021 [employer]  

## 2020-10-26 ENCOUNTER — Encounter (HOSPITAL_COMMUNITY): Payer: Self-pay | Admitting: Cardiovascular Disease

## 2020-10-26 ENCOUNTER — Telehealth: Payer: Self-pay | Admitting: Family Medicine

## 2020-10-26 MED FILL — Heparin Sod (Porcine)-NaCl IV Soln 1000 Unit/500ML-0.9%: INTRAVENOUS | Qty: 1000 | Status: AC

## 2020-10-26 NOTE — Telephone Encounter (Signed)
Given recent cath results and EF is he okay to go ahead and have his hip surgery from a cardiac standpoint?

## 2020-10-26 NOTE — Telephone Encounter (Signed)
Patient called stating that he now needs pre-op clearance for hip surgery since having the heart Cath.

## 2020-10-29 NOTE — Telephone Encounter (Signed)
Patient called to check status of pre-op clearance. Advised that message would be sent to provider.

## 2020-10-29 NOTE — Telephone Encounter (Signed)
Ok to clear him for his surgery. ANdy please coordinate documenting his clearnace in his chart.   Carlyle Dolly MD

## 2020-10-30 ENCOUNTER — Encounter: Payer: Self-pay | Admitting: Family Medicine

## 2020-10-30 NOTE — Telephone Encounter (Signed)
[facility] to confirm receipt of the preop card clearance. Lmom for their surgery scheduler. The patients pre-op cardiovascular clearance was faxed to their office this morning. They are to contact the [facility] office if not received.

## 2020-10-30 NOTE — Telephone Encounter (Signed)
Just faxed the note to Dr. Tawny Asal office to physician who is doing his hip surgery.  You may want to check to make sure it went.  I faxed it through epic.  Thanks

## 2020-11-01 ENCOUNTER — Ambulatory Visit: Payer: Medicare Other | Admitting: Family Medicine

## 2020-11-01 ENCOUNTER — Telehealth: Payer: Self-pay | Admitting: General Practice

## 2020-11-01 NOTE — Telephone Encounter (Signed)
   Primary Cardiologist: Carlyle Dolly, MD  Chart reviewed as part of pre-operative protocol coverage. Given past medical history and time since last visit, based on ACC/AHA guidelines, Craig Mccann would be at acceptable risk for the planned procedure without further cardiovascular testing.   His RCRI is class II risk,  0.9% risk of major cardiac event.  I will route this recommendation to the requesting party via Epic fax function and remove from pre-op pool.  Please call with questions.  Jossie Ng. Temprance Wyre NP-C    11/01/2020, 10:37 AM [place] [address] [address] Office [phone] Fax [phone]

## 2020-11-01 NOTE — Telephone Encounter (Signed)
   Primary Cardiologist: Carlyle Dolly, MD  Chart reviewed as part of pre-operative protocol coverage. Given past medical history and time since last visit, based on ACC/AHA guidelines, Craig Mccann would be at acceptable risk for the planned procedure without further cardiovascular testing.   His RCRI is a class II risk 0.9% risk of major cardiac event.  I will route this recommendation to the requesting party via Epic fax function and remove from pre-op pool.  Please call with questions.  Jossie Ng. Ladine Kiper NP-C    11/01/2020, 10:45 AM [place] [address] Office [phone] Fax [phone]

## 2020-11-01 NOTE — Telephone Encounter (Signed)
New message    Raliegh Ip sent new fax : Is patient cleared for surgery, the response that they received back did not say if patient was clear?   Patient is having spinal anesthesia .

## 2020-11-05 DIAGNOSIS — M1612 Unilateral primary osteoarthritis, left hip: Secondary | ICD-10-CM | POA: Diagnosis not present

## 2020-11-07 ENCOUNTER — Encounter (HOSPITAL_COMMUNITY): Payer: Self-pay

## 2020-11-07 NOTE — Patient Instructions (Signed)
DUE TO COVID-19 ONLY ONE VISITOR IS ALLOWED TO COME WITH YOU AND STAY IN THE WAITING ROOM ONLY DURING PRE OP AND PROCEDURE DAY OF SURGERY.   TWO VISITOR  MAY VISIT WITH YOU AFTER SURGERY IN YOUR PRIVATE ROOM DURING VISITING HOURS ONLY!  YOU NEED TO HAVE A COVID 19 TEST ON__4-15-22_____ @_______ , THIS TEST MUST BE DONE BEFORE SURGERY,  COVID TESTING SITE [address] [place] [address], IT IS ON THE RIGHT GOING OUT [address] APPROXIMATELY  2 MINUTES PAST [employer] ON THE RIGHT. ONCE YOUR COVID TEST IS COMPLETED,  PLEASE BEGIN THE QUARANTINE INSTRUCTIONS AS OUTLINED IN YOUR HANDOUT.                STEADMAN PROSPERI  11/07/2020   Your procedure is scheduled on: 11-20-20   Report to [facility] Main  Entrance   Report to admitting at      [place]  AM     Call this number if you have problems the morning of surgery [phone]    Remember: Do not eat food :After Midnight. You may have clear liquids until 0700 am then nothing by mouth    CLEAR LIQUID DIET                                                                                    Foods Excluded  Black Coffee and tea, regular and decaf                                         liquids that you cannot  Plain Jell-O any favor except red or purple                                           see through such as: Fruit ices (not with fruit pulp)                                                   milk, soups, orange juice  Iced Popsicles                                                              All solid food Carbonated beverages, regular and diet                                    Cranberry, grape and apple juices Sports drinks like Gatorade Lightly seasoned clear broth or consume(fat free) Sugar, honey syrup   _____________________________________________________________________    BRUSH YOUR TEETH MORNING OF SURGERY AND RINSE  YOUR MOUTH OUT, NO CHEWING GUM CANDY OR MINTS.     Take these medicines  the morning of surgery with A SIP OF WATER: inhaler and bring with you                                 You may not have any metal on your body including hair pins and              piercings  Do not wear jewelry, , lotions, powders or perfumes, deodorant                Men may shave face and neck.   Do not bring valuables to the hospital. [place].  Contacts, dentures or bridgework may not be worn into surgery. .     Patients discharged the day of surgery will not be allowed to drive home. IF YOU ARE HAVING SURGERY AND GOING HOME THE SAME DAY, YOU MUST HAVE AN ADULT TO DRIVE YOU HOME AND BE WITH YOU FOR 24 HOURS. YOU MAY GO HOME BY TAXI OR UBER OR ORTHERWISE, BUT AN ADULT MUST ACCOMPANY YOU HOME AND STAY WITH YOU FOR 24 HOURS.  Name and phone number of your driver:  Special Instructions: N/A              Please read over the following fact sheets you were given: _____________________________________________________________________             [facility] - Preparing for Surgery Before surgery, you can play an important role.  Because skin is not sterile, your skin needs to be as free of germs as possible.  You can reduce the number of germs on your skin by washing with CHG (chlorahexidine gluconate) soap before surgery.  CHG is an antiseptic cleaner which kills germs and bonds with the skin to continue killing germs even after washing. Please DO NOT use if you have an allergy to CHG or antibacterial soaps.  If your skin becomes reddened/irritated stop using the CHG and inform your nurse when you arrive at Short Stay. Do not shave (including legs and underarms) for at least 48 hours prior to the first CHG shower.  You may shave your face/neck. Please follow these instructions carefully:  1.  Shower with CHG Soap the night before surgery and the  morning of Surgery.  2.  If you choose to wash your hair, wash your hair first as usual with  your  normal  shampoo.  3.  After you shampoo, rinse your hair and body thoroughly to remove the  shampoo.                           4.  Use CHG as you would any other liquid soap.  You can apply chg directly  to the skin and wash                       Gently with a scrungie or clean washcloth.  5.  Apply the CHG Soap to your body ONLY FROM THE NECK DOWN.   Do not use on face/ open                           Wound  or open sores. Avoid contact with eyes, ears mouth and genitals (private parts).                       Wash face,  Genitals (private parts) with your normal soap.             6.  Wash thoroughly, paying special attention to the area where your surgery  will be performed.  7.  Thoroughly rinse your body with warm water from the neck down.  8.  DO NOT shower/wash with your normal soap after using and rinsing off  the CHG Soap.                9.  Pat yourself dry with a clean towel.            10.  Wear clean pajamas.            11.  Place clean sheets on your bed the night of your first shower and do not  sleep with pets. Day of Surgery : Do not apply any lotions/deodorants the morning of surgery.  Please wear clean clothes to the hospital/surgery center.  FAILURE TO FOLLOW THESE INSTRUCTIONS MAY RESULT IN THE CANCELLATION OF YOUR SURGERY PATIENT SIGNATURE_________________________________  NURSE SIGNATURE__________________________________  ________________________________________________________________________   Adam Phenix  An incentive spirometer is a tool that can help keep your lungs clear and active. This tool measures how well you are filling your lungs with each breath. Taking long deep breaths may help reverse or decrease the chance of developing breathing (pulmonary) problems (especially infection) following:  A long period of time when you are unable to move or be active. BEFORE THE PROCEDURE   If the spirometer includes an indicator to show your best effort,  your nurse or respiratory therapist will set it to a desired goal.  If possible, sit up straight or lean slightly forward. Try not to slouch.  Hold the incentive spirometer in an upright position. INSTRUCTIONS FOR USE  1. Sit on the edge of your bed if possible, or sit up as far as you can in bed or on a chair. 2. Hold the incentive spirometer in an upright position. 3. Breathe out normally. 4. Place the mouthpiece in your mouth and seal your lips tightly around it. 5. Breathe in slowly and as deeply as possible, raising the piston or the ball toward the top of the column. 6. Hold your breath for 3-5 seconds or for as long as possible. Allow the piston or ball to fall to the bottom of the column. 7. Remove the mouthpiece from your mouth and breathe out normally. 8. Rest for a few seconds and repeat Steps 1 through 7 at least 10 times every 1-2 hours when you are awake. Take your time and take a few normal breaths between deep breaths. 9. The spirometer may include an indicator to show your best effort. Use the indicator as a goal to work toward during each repetition. 10. After each set of 10 deep breaths, practice coughing to be sure your lungs are clear. If you have an incision (the cut made at the time of surgery), support your incision when coughing by placing a pillow or rolled up towels firmly against it. Once you are able to get out of bed, walk around indoors and cough well. You may stop using the incentive spirometer when instructed by your caregiver.  RISKS AND COMPLICATIONS  Take your time so you do not get dizzy  or light-headed.  If you are in pain, you may need to take or ask for pain medication before doing incentive spirometry. It is harder to take a deep breath if you are having pain. AFTER USE  Rest and breathe slowly and easily.  It can be helpful to keep track of a log of your progress. Your caregiver can provide you with a simple table to help with this. If you are  using the spirometer at home, follow these instructions: [place] IF:   You are having difficultly using the spirometer.  You have trouble using the spirometer as often as instructed.  Your pain medication is not giving enough relief while using the spirometer.  You develop fever of 100.5 F (38.1 C) or higher. SEEK IMMEDIATE MEDICAL CARE IF:   You cough up bloody sputum that had not been present before.  You develop fever of 102 F (38.9 C) or greater.  You develop worsening pain at or near the incision site. MAKE SURE YOU:   Understand these instructions.  Will watch your condition.  Will get help right away if you are not doing well or get worse. Document Released: 12/01/2006 Document Revised: 10/13/2011 Document Reviewed: 02/01/2007 [facility] Patient Information 2014 [place], [place].   ________________________________________________________________________

## 2020-11-07 NOTE — Progress Notes (Signed)
Please place ordrs in epic pt. Is scheduled for preop

## 2020-11-07 NOTE — Progress Notes (Addendum)
PCP - Allyn Kenner, MD Cardiologist - clearance Carlyle Dolly 11-01-20 epic  PPM/ICD -  Device Orders -  Rep Notified -   Chest x-ray -  EKG - 10-05-20  epic Stress Test - 10-12-20 ECHO - 10-12-20 Cardiac Cath - 11-01-20  Sleep Study -  CPAP -   Fasting Blood Sugar -  Checks Blood Sugar _____ times a day  Blood Thinner Instructions: Aspirin Instructions:  ERAS Protcol - PRE-SURGERY Ensure or G2-   COVID TEST- 4-15  Activity-- Anesthesia review: CAD,COPD, Cardiomyopathy, HTN, EF 29%  Patient denies shortness of breath, fever, cough and chest pain at PAT appointment   All instructions explained to the patient, with a verbal understanding of the material. Patient agrees to go over the instructions while at home for a better understanding. Patient also instructed to self quarantine after being tested for COVID-19. The opportunity to ask questions was provided.

## 2020-11-08 ENCOUNTER — Other Ambulatory Visit: Payer: Self-pay

## 2020-11-08 ENCOUNTER — Encounter (HOSPITAL_COMMUNITY)
Admission: RE | Admit: 2020-11-08 | Discharge: 2020-11-08 | Disposition: A | Discharge status: Home / Self Care | Payer: Medicare HMO | Source: Ambulatory Visit | Attending: Orthopedic Surgery | Admitting: Orthopedic Surgery

## 2020-11-08 ENCOUNTER — Encounter (HOSPITAL_COMMUNITY): Payer: Self-pay

## 2020-11-08 DIAGNOSIS — Z01818 Encounter for other preprocedural examination: Secondary | ICD-10-CM | POA: Insufficient documentation

## 2020-11-08 HISTORY — DX: Personal history of urinary calculi: Z87.442

## 2020-11-08 LAB — BASIC METABOLIC PANEL
Anion gap: 7 (ref 5–15)
BUN: 26 mg/dL — ABNORMAL HIGH (ref 8–23)
CO2: 24 mmol/L (ref 22–32)
Calcium: 9.6 mg/dL (ref 8.9–10.3)
Chloride: 105 mmol/L (ref 98–111)
Creatinine, Ser: 1.57 mg/dL — ABNORMAL HIGH (ref 0.61–1.24)
GFR, Estimated: 47 mL/min — ABNORMAL LOW (ref >60)
Glucose, Bld: 98 mg/dL (ref 70–99)
Potassium: 4.8 mmol/L (ref 3.5–5.1)
Sodium: 136 mmol/L (ref 135–145)

## 2020-11-08 LAB — CBC
HCT: 46.5 % (ref 39.0–52.0)
Hemoglobin: 15.3 g/dL (ref 13.0–17.0)
MCH: 30.4 pg (ref 26.0–34.0)
MCHC: 32.9 g/dL (ref 30.0–36.0)
MCV: 92.4 fL (ref 80.0–100.0)
Platelets: 254 10*3/uL (ref 150–400)
RBC: 5.03 MIL/uL (ref 4.22–5.81)
RDW: 14.6 % (ref 11.5–15.5)
WBC: 7.4 10*3/uL (ref 4.0–10.5)
nRBC: 0 % (ref 0.0–0.2)

## 2020-11-08 LAB — TYPE AND SCREEN
ABO/RH(D): A POS
Antibody Screen: NEGATIVE

## 2020-11-08 LAB — SURGICAL PCR SCREEN
MRSA, PCR: NEGATIVE
Staphylococcus aureus: POSITIVE — AB

## 2020-11-09 NOTE — Anesthesia Preprocedure Evaluation (Addendum)
Anesthesia Evaluation  Patient identified by MRN, date of birth, ID band Patient awake    Reviewed: Allergy & Precautions, NPO status , Patient's Chart, lab work & pertinent test results  History of Anesthesia Complications (+) POST - OP SPINAL HEADACHE and history of anesthetic complications  Airway Mallampati: II  TM Distance: >3 FB     Dental  (+) Edentulous Upper   Pulmonary COPD, Current Smoker and Patient abstained from smoking.,    Pulmonary exam normal breath sounds clear to auscultation       Cardiovascular + CAD   Rhythm:Regular Rate:Normal  EKG: 11/08/2020 Rate 83 bpm  Normal sinus rhythm Left axis deviation Nonspecific T wave abnormality Abnormal ECG No significant change since last tracing  CV: Echo 10/12/20 1. Left ventricular ejection fraction, by estimation, is 30 to 35%. The  left ventricle has moderately decreased function. The left ventricle  demonstrates global hypokinesis. The left ventricular internal cavity size  was mildly dilated. There is moderate  to severe asymmetric left ventricular hypertrophy of the septal segment.  Left ventricular diastolic parameters are consistent with Grade I  diastolic dysfunction (impaired relaxation).  2. Right ventricular systolic function is normal. The right ventricular  size is normal. There is normal pulmonary artery systolic pressure. The  estimated right ventricular systolic pressure is 09.8 mmHg.  3. There is a trivial pericardial effusion anterior to the right  ventricle.  4. The mitral valve is abnormal. Trivial mitral valve regurgitation.  5. The aortic valve is tricuspid. There is mild calcification of the  aortic valve. Aortic valve regurgitation is not visualized. Mild aortic  valve sclerosis is present, with no evidence of aortic valve stenosis.  6. The inferior vena cava is normal in size with greater than 50%  respiratory variability,  suggesting right atrial pressure of 3 mmHg.  Cardiac Cath 10/25/2020  Ost 1st Diag lesion is 20% stenosed.  Dist RCA lesion is 30% stenosed.  Ramus lesion is 25% stenosed.  Prox Cx to Mid Cx lesion is 65% stenosed.  Mid RCA lesion is 15% stenosed.  Mild to moderate nonobstructive CAD with 20% irregularity in the first diagonal branch of the LAD, 20% stenosis in the ramus intermediate vessel, smooth 20% proximal circumflex stenosis with no change in the previously noted 65% mid AV groove circumflex stenosis, and mild 15 and 30% smooth narrowings in a dominant RCA.  No significant change since the prior catheterization of 2019.  LVEDP 15 mmHg.   Neuro/Psych  Headaches, Hearing loss negative psych ROS   GI/Hepatic negative GI ROS, Neg liver ROS,   Endo/Other  negative endocrine ROS  Renal/GU Renal InsufficiencyRenal disease  negative genitourinary   Musculoskeletal negative musculoskeletal ROS (+)   Abdominal   Peds negative pediatric ROS (+)  Hematology negative hematology ROS (+)   Anesthesia Other Findings H/o melanoma  Reproductive/Obstetrics negative OB ROS                           Anesthesia Physical Anesthesia Plan  ASA: IV  Anesthesia Plan: General   Post-op Pain Management:    Induction: Intravenous  PONV Risk Score and Plan: 1  Airway Management Planned: Oral ETT  Additional Equipment: None  Intra-op Plan:   Post-operative Plan: Extubation in OR  Informed Consent: I have reviewed the patients History and Physical, chart, labs and discussed the procedure including the risks, benefits and alternatives for the proposed anesthesia with the patient or authorized representative who has  indicated his/her understanding and acceptance.     Dental advisory given  Plan Discussed with: CRNA, Anesthesiologist and Surgeon  Anesthesia Plan Comments: (Will avoid midazolam given h/o anaphylaxis with valium. 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 Norton Blizzard, MD  )      Anesthesia Quick Evaluation

## 2020-11-09 NOTE — Progress Notes (Signed)
Anesthesia Chart Review   Case: [id number] Date/Time: 11/20/20 0958   Procedure: TOTAL HIP ARTHROPLASTY (Left Hip)   Anesthesia type: Spinal   Pre-op diagnosis: djd left hip   Location: [facility] ROOM 08 / [facility]   Surgeons: Renette Butters, MD      DISCUSSION:70 y.o. current every days smoker with h/o COPD, CKD, CAD, nonischemic cardiomyopathy (Echo 10/12/20 30 to 35%), left hip djd scheduled for above procedure 11/20/2020 with Dr. Edmonia Lynch.   Per cardiology preoperative evaluation 11/01/2020, "Chart reviewed as part of pre-operative protocol coverage. Given past medical history and time since last visit, based on ACC/AHA guidelines, Craig Mccann would be at acceptable risk for the planned procedure without further cardiovascular testing.   His RCRI is a class II risk 0.9% risk of major cardiac event."  Anticipate pt can proceed with planned procedure barring acute status change.   VS: BP (!) 141/77   Pulse 85   Temp 36.8 C (Oral)   Resp 15   Ht 6' 1.5" (1.867 m)   Wt 116.1 kg   SpO2 97%   BMI 33.32 kg/m   PROVIDERS: Celene Squibb, MD is PCP    LABS: Labs reviewed: Acceptable for surgery. (all labs ordered are listed, but only abnormal results are displayed)  Labs Reviewed  SURGICAL PCR SCREEN - Abnormal; Notable for the following components:      Result Value   Staphylococcus aureus POSITIVE (*)    All other components within normal limits  BASIC METABOLIC PANEL - Abnormal; Notable for the following components:   BUN 26 (*)    Creatinine, Ser 1.57 (*)    GFR, Estimated 47 (*)    All other components within normal limits  CBC  TYPE AND SCREEN     IMAGES:   EKG: 11/08/2020 Rate 83 bpm  Normal sinus rhythm Left axis deviation Nonspecific T wave abnormality Abnormal ECG No significant change since last tracing  CV: Echo 10/12/20 1. Left ventricular ejection fraction, by estimation, is 30 to 35%. The  left ventricle has moderately decreased function. The  left ventricle  demonstrates global hypokinesis. The left ventricular internal cavity size  was mildly dilated. There is moderate  to severe asymmetric left ventricular hypertrophy of the septal segment.  Left ventricular diastolic parameters are consistent with Grade I  diastolic dysfunction (impaired relaxation).  2. Right ventricular systolic function is normal. The right ventricular  size is normal. There is normal pulmonary artery systolic pressure. The  estimated right ventricular systolic pressure is 74.0 mmHg.  3. There is a trivial pericardial effusion anterior to the right  ventricle.  4. The mitral valve is abnormal. Trivial mitral valve regurgitation.  5. The aortic valve is tricuspid. There is mild calcification of the  aortic valve. Aortic valve regurgitation is not visualized. Mild aortic  valve sclerosis is present, with no evidence of aortic valve stenosis.  6. The inferior vena cava is normal in size with greater than 50%  respiratory variability, suggesting right atrial pressure of 3 mmHg.  Cardiac Cath 10/25/2020  Ost 1st Diag lesion is 20% stenosed.  Dist RCA lesion is 30% stenosed.  Ramus lesion is 25% stenosed.  Prox Cx to Mid Cx lesion is 65% stenosed.  Mid RCA lesion is 15% stenosed.   Mild to moderate nonobstructive CAD with 20% irregularity in the first diagonal branch of the LAD, 20% stenosis in the ramus intermediate vessel, smooth 20% proximal circumflex stenosis with no change in the previously noted  65% mid AV groove circumflex stenosis, and mild 15 and 30% smooth narrowings in a dominant RCA.  No significant change since the prior catheterization of 2019.  LVEDP 15 mmHg.  RECOMMENDATION: Guideline directed medical therapy for nonischemic cardiomyopathy with reduced EF at 30 to 35%.  The patient will return to Dr. Harl Bowie.  Smoking cessation is essential.  Aggressive lipid-lowering therapy with target LDL less than 70  Past Medical  History:  Diagnosis Date  . Acute prostatitis 01/2015  . Allergy   . Basal cell carcinoma 2017   Multiple: back, left shin, left arm  . CAD (coronary artery disease)    a. 07/2017: cath showing 50-60% stenosis along the LCx with a FFR of 0.94 not being hemodynamically significant  . CAP (community acquired pneumonia) 06/26/2015  . Chronic renal insufficiency, stage II (mild)    stage II/II: CrCl 60s.  . Complication of anesthesia   . COPD (chronic obstructive pulmonary disease) ([place]) 05/07/15   Long time smoker + COPD changes noted on lung ca screening CT  . Diverticulitis   . Encounter for screening for lung cancer    Screening CT ok 08/27/16--repeat in 1 yr recommended.  . Family history of colon cancer in mother   . Hearing impairment    hearing aids  . History of adenomatous polyp of colon 12/24/10  . History of kidney stones   . Hyperlipidemia 07/14/2016   started atorv 20 07/16/16  . Kidney stones    bilat nonobstrucing renal calculi 2016, + left benign renal cysts  . Melanoma ([place])    R posterolateral neck and R nasal ala.  Most recent was left mid back 01/2016 (Dr. Tarri Glenn).  [name] Spinal headache   . Tobacco dependence    CT chest for lung ca screning 04/2015 showed benign findings; 1 yr repeat recommended    Past Surgical History:  Procedure Laterality Date  . CERVICAL FUSION    . CHOLECYSTECTOMY  1987  . COLONOSCOPY  12/24/10; 2014   Multiple TCSs: pt on 5 yr recall, next due 2019.  [name] COLONOSCOPY N/A 10/09/2017   Procedure: COLONOSCOPY;  Surgeon: Danie Binder, MD;  Location[facility];  Service: Endoscopy;  Laterality: N/A;  1:30  . INGUINAL HERNIA REPAIR  1984  . INTRAVASCULAR PRESSURE WIRE/FFR STUDY N/A 07/14/2017   Procedure: INTRAVASCULAR PRESSURE WIRE/FFR STUDY;  Surgeon: Belva Crome, MD;  Location: [facility];  Service: Cardiovascular;  Laterality: N/A;  . JOINT REPLACEMENT     left  . KNEE SURGERY Left 2015   cartilage repair  . KNEE SURGERY  Right 1997   "          "  . LEFT HEART CATH AND CORONARY ANGIOGRAPHY N/A 07/14/2017   Procedure: LEFT HEART CATH AND CORONARY ANGIOGRAPHY;  Surgeon: Belva Crome, MD;  Location: [place] CV LAB;  Service: Cardiovascular;  Laterality: N/A;  . LEFT HEART CATH AND CORONARY ANGIOGRAPHY N/A 10/25/2020   Procedure: LEFT HEART CATH AND CORONARY ANGIOGRAPHY;  Surgeon: Troy Sine, MD;  Location: [place] CV LAB;  Service: Cardiovascular;  Laterality: N/A;  . LITHOTRIPSY    . NECK SURGERY  1989  . Thumb surgery Bilateral 2005   and 2006  . TOTAL KNEE ARTHROPLASTY Left 04/23/2016   Procedure: TOTAL KNEE ARTHROPLASTY;  Surgeon: Ninetta Lights, MD;  Location: [place];  Service: Orthopedics;  Laterality: Left;    MEDICATIONS: . albuterol (PROAIR HFA) 108 (90 BASE) MCG/ACT inhaler  . aspirin  EC 81 MG tablet  . atorvastatin (LIPITOR) 20 MG tablet  . isosorbide mononitrate (IMDUR) 30 MG 24 hr tablet  . losartan (COZAAR) 25 MG tablet  . nitroGLYCERIN (NITROSTAT) 0.4 MG SL tablet   No current facility-administered medications for this encounter.    Konrad Felix, PA-C [facility] Pre-Surgical Testing [phone]

## 2020-11-15 ENCOUNTER — Other Ambulatory Visit: Payer: Self-pay | Admitting: *Deleted

## 2020-11-15 ENCOUNTER — Encounter: Payer: Self-pay | Admitting: *Deleted

## 2020-11-15 DIAGNOSIS — E782 Mixed hyperlipidemia: Secondary | ICD-10-CM

## 2020-11-16 ENCOUNTER — Other Ambulatory Visit (HOSPITAL_COMMUNITY)
Admission: RE | Admit: 2020-11-16 | Discharge: 2020-11-16 | Disposition: A | Discharge status: Home / Self Care | Payer: Medicare HMO | Source: Ambulatory Visit | Attending: Orthopedic Surgery | Admitting: Orthopedic Surgery

## 2020-11-16 DIAGNOSIS — Z01812 Encounter for preprocedural laboratory examination: Secondary | ICD-10-CM | POA: Insufficient documentation

## 2020-11-16 DIAGNOSIS — Z20822 Contact with and (suspected) exposure to covid-19: Secondary | ICD-10-CM | POA: Insufficient documentation

## 2020-11-16 LAB — SARS CORONAVIRUS 2 (TAT 6-24 HRS): SARS Coronavirus 2: NEGATIVE

## 2020-11-19 NOTE — H&P (Signed)
HIP ARTHROPLASTY ADMISSION H&P  Patient ID: Craig Mccann MRN: [id number] DOB/AGE: 03-05-1951 70 y.o.  Chief Complaint: left hip pain.  Planned Procedure Date: 11-20-20  Medical Clearance by Delphina Cahill MD   Cardiac Clearance by Carlyle Dolly MD   HPI: Craig Mccann is a 70 y.o. 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 The patient has a history of pain and functional disability in the left hip due to arthritis and has failed non-surgical conservative treatments for greater than 12 weeks to include NSAID's and/or analgesics, corticosteriod injections and activity modification.  Onset of symptoms was abrupt, starting 1 years ago with rapidlly worsening course since that time. The patient noted no past surgery on the left hip.  Patient currently rates pain at 9 out of 10 with activity. Patient has night pain, worsening of pain with activity and weight bearing and pain that interferes with activities of daily living.  Patient has evidence of subchondral sclerosis and joint space narrowing by imaging studies.  There is no active infection.  Past Medical History:  Diagnosis Date  . Acute prostatitis 01/2015  . Allergy   . Basal cell carcinoma 2017   Multiple: back, left shin, left arm  . CAD (coronary artery disease)    a. 07/2017: cath showing 50-60% stenosis along the LCx with a FFR of 0.94 not being hemodynamically significant  . CAP (community acquired pneumonia) 06/26/2015  . Chronic renal insufficiency, stage II (mild)    stage II/II: CrCl 60s.  . Complication of anesthesia   . COPD (chronic obstructive pulmonary disease) ([place]) 05/07/15   Long time smoker + COPD changes noted on lung ca screening CT  . Diverticulitis   . Encounter for screening for lung cancer    Screening CT ok 08/27/16--repeat in 1 yr recommended.  . Family history of colon cancer in mother   . Hearing impairment    hearing aids  . History of adenomatous polyp of colon 12/24/10  . History of kidney  stones   . Hyperlipidemia 07/14/2016   started atorv 20 07/16/16  . Kidney stones    bilat nonobstrucing renal calculi 2016, + left benign renal cysts  . Melanoma ([place])    R posterolateral neck and R nasal ala.  Most recent was left mid back 01/2016 (Dr. Tarri Glenn).  [name] Spinal headache   . Tobacco dependence    CT chest for lung ca screning 04/2015 showed benign findings; 1 yr repeat recommended   Past Surgical History:  Procedure Laterality Date  . CERVICAL FUSION    . CHOLECYSTECTOMY  1987  . COLONOSCOPY  12/24/10; 2014   Multiple TCSs: pt on 5 yr recall, next due 2019.  [name] COLONOSCOPY N/A 10/09/2017   Procedure: COLONOSCOPY;  Surgeon: Danie Binder, MD;  Location[facility];  Service: Endoscopy;  Laterality: N/A;  1:30  . INGUINAL HERNIA REPAIR  1984  . INTRAVASCULAR PRESSURE WIRE/FFR STUDY N/A 07/14/2017   Procedure: INTRAVASCULAR PRESSURE WIRE/FFR STUDY;  Surgeon: Belva Crome, MD;  Location: [facility];  Service: Cardiovascular;  Laterality: N/A;  . JOINT REPLACEMENT     left  . KNEE SURGERY Left 2015   cartilage repair  . KNEE SURGERY Right 1997   "          "  . LEFT HEART CATH AND CORONARY ANGIOGRAPHY N/A 07/14/2017   Procedure: LEFT HEART CATH AND CORONARY ANGIOGRAPHY;  Surgeon: Belva Crome, MD;  Location: [facility];  Service:  Cardiovascular;  Laterality: N/A;  . LEFT HEART CATH AND CORONARY ANGIOGRAPHY N/A 10/25/2020   Procedure: LEFT HEART CATH AND CORONARY ANGIOGRAPHY;  Surgeon: Troy Sine, MD;  Location: [facility];  Service: Cardiovascular;  Laterality: N/A;  . LITHOTRIPSY    . NECK SURGERY  1989  . Thumb surgery Bilateral 2005   and 2006  . TOTAL KNEE ARTHROPLASTY Left 04/23/2016   Procedure: TOTAL KNEE ARTHROPLASTY;  Surgeon: Ninetta Lights, MD;  Location: [facility];  Service: Orthopedics;  Laterality: Left;   Allergies  Allergen Reactions  . Codeine Hives  . Lexiscan [Regadenoson] Other (See Comments)    Transient Heart Block  .  Valium [Diazepam] Anaphylaxis   Prior to Admission medications   Medication Sig Start Date End Date Taking? Authorizing Provider  albuterol (PROAIR HFA) 108 (90 BASE) MCG/ACT inhaler Inhale 2 puffs into the lungs every 6 (six) hours as needed for wheezing or shortness of breath. 06/26/15   McGowenAdrian Blackwater, MD  aspirin EC 81 MG tablet Take 81 mg by mouth every evening.    [provider]  atorvastatin (LIPITOR) 20 MG tablet Take 1 tablet (20 mg total) by mouth daily. Patient taking differently: Take 20 mg by mouth every evening. 09/27/20   Verta Ellen., NP  isosorbide mononitrate (IMDUR) 30 MG 24 hr tablet Take 1 tablet (30 mg total) by mouth daily. Patient taking differently: Take 30 mg by mouth every evening. 10/15/20 01/13/21  Verta Ellen., NP  losartan (COZAAR) 25 MG tablet Take 0.5 tablets (12.5 mg total) by mouth daily. Patient taking differently: Take 12.5 mg by mouth every evening. 10/22/20 01/20/21  Verta Ellen., NP  nitroGLYCERIN (NITROSTAT) 0.4 MG SL tablet Place 1 tablet (0.4 mg total) under the tongue every 5 (five) minutes as needed for chest pain. Patient taking differently: Place 0.4 mg under the tongue every 5 (five) minutes x 3 doses as needed for chest pain. 09/27/20 12/26/20  Verta Ellen., NP   Social History   Socioeconomic History  . Marital status: Married    Spouse name: Not on file  . Number of children: Not on file  . Years of education: Not on file  . Highest education level: Not on file  Occupational History  . Not on file  Tobacco Use  . Smoking status: Current Every Day Smoker    Packs/day: 0.50    Years: 40.00    Pack years: 20.00    Types: Cigarettes    Start date: 08/04/1968  . Smokeless tobacco: Never Used  . Tobacco comment: half pack a day  Vaping Use  . Vaping Use: Never used  Substance and Sexual Activity  . Alcohol use: No    Alcohol/week: 0.0 standard drinks  . Drug use: No  . Sexual activity: Yes  Other  Topics Concern  . Not on file  Social History Narrative   Married, 3 daughters.   Educ: HS + 2 yr technical school.   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   Relocated to [facility] 2016.   Tob: 50 pack-yr hx; cutting back as of 01/2015.   No alc or drugs.   Social Determinants of Health   Financial Resource Strain: Not on file  Food Insecurity: Not on file  Transportation Needs: Not on file  Physical Activity: Not on file  Stress: Not on file  Social Connections: Not on file   Family History  Problem Relation Age of Onset  .  Colon cancer Mother   . Cancer Mother   . Lung cancer Father   . Cancer Father   . Brain cancer Brother   . Cancer Brother     ROS: Currently denies lightheadedness, dizziness, Fever, chills, CP, SOB.   No personal history of DVT, PE, MI, or CVA. Recent Heart Cath done, no stents placed No loose teeth. Has dentures All other systems have been reviewed and were otherwise currently negative with the exception of those mentioned in the HPI and as above.  Objective: Vitals: Ht: 6'1" Wt: 255.4 lbs Temp: 97.9 BP: 143/99 Pulse: 77 O2 97% on room air.   Physical Exam: General: Alert, NAD. Trendelenberg Gait  HEENT: EOMI, Good Neck Extension. Head is normocephalic, atraumatic.  Pulm: No increased work of breathing.  Clear B/L A/P w/o crackle or wheeze.  CV: RRR, No m/g/r appreciated  GI: soft, NT, ND Neuro: CN II-XII grossly intact without focal deficit.  Sensation intact distally Skin: No lesions in the area of chief complaint MSK/Surgical Site: Hip pain with passive ROM. + Stinchfield. + SLR. +FABER/FADIR. Decreased strength.  NVI.    Imaging Review Plain radiographs demonstrate severe degenerative joint disease of the bilateral hip.   The bone quality appears to be fair for age and reported activity level.  Preoperative templating of the joint replacement has been completed, documented, and submitted to the Operating Room personnel in order to  optimize intra-operative equipment management.  Assessment: djd left hip Active Problems:   * No active hospital problems. *   Plan: Plan for Procedure(s): TOTAL HIP ARTHROPLASTY  The patient history, physical exam, clinical judgement of the provider and imaging are consistent with end stage degenerative joint disease and total joint arthroplasty is deemed medically necessary. The treatment options including medical management, injection therapy, and arthroplasty were discussed at length. The risks and benefits of Procedure(s): TOTAL HIP ARTHROPLASTY were presented and reviewed.  The risks of nonoperative treatment, versus surgical intervention including but not limited to continued pain, aseptic loosening, stiffness, dislocation/subluxation, infection, bleeding, nerve injury, blood clots, cardiopulmonary complications, morbidity, mortality, among others were discussed. The patient verbalizes understanding and wishes to proceed with the plan.  Patient is being admitted for surgery, pain control, PT, prophylactic antibiotics, VTE prophylaxis, progressive ambulation, ADL's and discharge planning.   Dental prophylaxis discussed and recommended for 2 years postoperatively.   The patient does meet the criteria for TXA which will be used perioperatively.    ASA 81 mg BID will be used postoperatively for DVT prophylaxis in addition to SCDs, and early ambulation.  Plan for Oxycodone, Mobic, Tylenol for pain.    Robaxin for spasm.   Zofran for nausea and vomiting.  Omeprazole for gastric protection.  Pharmacy: [facility]  The patient is planning to be discharged home with OPPT and into the care of his wife Neoma Laming who can be reached at [phone].  Follow up appt 11-30-20 at 10:45am   Alisa Graff Office [phone] 11/19/2020 2:42 PM

## 2020-11-20 ENCOUNTER — Ambulatory Visit (HOSPITAL_COMMUNITY): Payer: Medicare HMO

## 2020-11-20 ENCOUNTER — Ambulatory Visit (HOSPITAL_COMMUNITY): Payer: Medicare HMO | Admitting: Physician Assistant

## 2020-11-20 ENCOUNTER — Ambulatory Visit (HOSPITAL_COMMUNITY): Payer: Medicare HMO | Admitting: Certified Registered Nurse Anesthetist

## 2020-11-20 ENCOUNTER — Ambulatory Visit (HOSPITAL_COMMUNITY)
Admission: RE | Admit: 2020-11-20 | Discharge: 2020-11-20 | Disposition: A | Discharge status: Home / Self Care | Payer: Medicare HMO | Source: Ambulatory Visit | Attending: Orthopedic Surgery | Admitting: Orthopedic Surgery

## 2020-11-20 ENCOUNTER — Encounter (HOSPITAL_COMMUNITY)
Admission: RE | Disposition: A | Discharge status: Home / Self Care | Payer: Self-pay | Source: Ambulatory Visit | Attending: Orthopedic Surgery

## 2020-11-20 ENCOUNTER — Encounter (HOSPITAL_COMMUNITY): Payer: Self-pay | Admitting: Orthopedic Surgery

## 2020-11-20 DIAGNOSIS — J449 Chronic obstructive pulmonary disease, unspecified: Secondary | ICD-10-CM | POA: Diagnosis not present

## 2020-11-20 DIAGNOSIS — Z96642 Presence of left artificial hip joint: Secondary | ICD-10-CM | POA: Diagnosis not present

## 2020-11-20 DIAGNOSIS — F17210 Nicotine dependence, cigarettes, uncomplicated: Secondary | ICD-10-CM | POA: Insufficient documentation

## 2020-11-20 DIAGNOSIS — R69 Illness, unspecified: Secondary | ICD-10-CM | POA: Diagnosis not present

## 2020-11-20 DIAGNOSIS — Z888 Allergy status to other drugs, medicaments and biological substances status: Secondary | ICD-10-CM | POA: Diagnosis not present

## 2020-11-20 DIAGNOSIS — M1612 Unilateral primary osteoarthritis, left hip: Secondary | ICD-10-CM | POA: Insufficient documentation

## 2020-11-20 DIAGNOSIS — Z79899 Other long term (current) drug therapy: Secondary | ICD-10-CM | POA: Insufficient documentation

## 2020-11-20 DIAGNOSIS — Z7982 Long term (current) use of aspirin: Secondary | ICD-10-CM | POA: Insufficient documentation

## 2020-11-20 DIAGNOSIS — S82122A Displaced fracture of lateral condyle of left tibia, initial encounter for closed fracture: Secondary | ICD-10-CM

## 2020-11-20 DIAGNOSIS — Z885 Allergy status to narcotic agent status: Secondary | ICD-10-CM | POA: Diagnosis not present

## 2020-11-20 DIAGNOSIS — E785 Hyperlipidemia, unspecified: Secondary | ICD-10-CM | POA: Diagnosis not present

## 2020-11-20 DIAGNOSIS — Z471 Aftercare following joint replacement surgery: Secondary | ICD-10-CM | POA: Diagnosis not present

## 2020-11-20 DIAGNOSIS — I25119 Atherosclerotic heart disease of native coronary artery with unspecified angina pectoris: Secondary | ICD-10-CM | POA: Diagnosis not present

## 2020-11-20 HISTORY — PX: TOTAL HIP ARTHROPLASTY: SHX124

## 2020-11-20 SURGERY — ARTHROPLASTY, HIP, TOTAL, ANTERIOR APPROACH
Anesthesia: General | Site: Hip | Laterality: Left

## 2020-11-20 MED ORDER — LACTATED RINGERS IV BOLUS
250.0000 mL | Freq: Once | INTRAVENOUS | Status: AC
  Administered 2020-11-20: 250 mL via INTRAVENOUS

## 2020-11-20 MED ORDER — BUPIVACAINE LIPOSOME 1.3 % IJ SUSP
INTRAMUSCULAR | Status: DC | PRN
  Administered 2020-11-20: 20 mL

## 2020-11-20 MED ORDER — SODIUM CHLORIDE (PF) 0.9 % IJ SOLN
INTRAMUSCULAR | Status: AC
  Filled 2020-11-20: qty 20

## 2020-11-20 MED ORDER — EPHEDRINE SULFATE-NACL 50-0.9 MG/10ML-% IV SOSY
PREFILLED_SYRINGE | INTRAVENOUS | Status: DC | PRN
  Administered 2020-11-20: 5 mg via INTRAVENOUS

## 2020-11-20 MED ORDER — PROPOFOL 10 MG/ML IV BOLUS
INTRAVENOUS | Status: AC
  Filled 2020-11-20: qty 20

## 2020-11-20 MED ORDER — OMEPRAZOLE MAGNESIUM 20 MG PO TBEC: 20.0000 mg | DELAYED_RELEASE_TABLET | Freq: Every day | ORAL | 0 refills | Status: DC

## 2020-11-20 MED ORDER — PHENYLEPHRINE HCL (PRESSORS) 10 MG/ML IV SOLN
INTRAVENOUS | Status: AC
  Filled 2020-11-20: qty 1

## 2020-11-20 MED ORDER — PROPOFOL 10 MG/ML IV BOLUS
INTRAVENOUS | Status: DC | PRN
  Administered 2020-11-20: 150 mg via INTRAVENOUS

## 2020-11-20 MED ORDER — ORAL CARE MOUTH RINSE: 15.0000 mL | Freq: Once | OROMUCOSAL | Status: AC

## 2020-11-20 MED ORDER — BUPIVACAINE LIPOSOME 1.3 % IJ SUSP
20.0000 mL | Freq: Once | INTRAMUSCULAR | Status: DC
  Filled 2020-11-20: qty 20

## 2020-11-20 MED ORDER — ONDANSETRON HCL 4 MG/2ML IJ SOLN
INTRAMUSCULAR | Status: AC
  Filled 2020-11-20: qty 2

## 2020-11-20 MED ORDER — PHENYLEPHRINE 40 MCG/ML (10ML) SYRINGE FOR IV PUSH (FOR BLOOD PRESSURE SUPPORT)
PREFILLED_SYRINGE | INTRAVENOUS | Status: DC | PRN
  Administered 2020-11-20: 80 ug via INTRAVENOUS

## 2020-11-20 MED ORDER — LACTATED RINGERS IV SOLN: INTRAVENOUS | Status: DC

## 2020-11-20 MED ORDER — STERILE WATER FOR IRRIGATION IR SOLN
Status: DC | PRN
  Administered 2020-11-20: 2000 mL

## 2020-11-20 MED ORDER — LIDOCAINE 2% (20 MG/ML) 5 ML SYRINGE
INTRAMUSCULAR | Status: DC | PRN
  Administered 2020-11-20: 100 mg via INTRAVENOUS

## 2020-11-20 MED ORDER — METOCLOPRAMIDE HCL 5 MG PO TABS: 5.0000 mg | ORAL_TABLET | Freq: Three times a day (TID) | ORAL | Status: DC | PRN

## 2020-11-20 MED ORDER — CELECOXIB 200 MG PO CAPS
200.0000 mg | ORAL_CAPSULE | Freq: Once | ORAL | Status: AC
  Administered 2020-11-20: 200 mg via ORAL
  Filled 2020-11-20: qty 1

## 2020-11-20 MED ORDER — DEXAMETHASONE SODIUM PHOSPHATE 10 MG/ML IJ SOLN
INTRAMUSCULAR | Status: AC
  Filled 2020-11-20: qty 1

## 2020-11-20 MED ORDER — TRANEXAMIC ACID-NACL 1000-0.7 MG/100ML-% IV SOLN
1000.0000 mg | INTRAVENOUS | Status: AC
  Administered 2020-11-20: 1000 mg via INTRAVENOUS
  Filled 2020-11-20: qty 100

## 2020-11-20 MED ORDER — FENTANYL CITRATE (PF) 100 MCG/2ML IJ SOLN
INTRAMUSCULAR | Status: DC | PRN
  Administered 2020-11-20: 50 ug via INTRAVENOUS
  Administered 2020-11-20 (×2): 100 ug via INTRAVENOUS

## 2020-11-20 MED ORDER — CEFAZOLIN SODIUM-DEXTROSE 2-4 GM/100ML-% IV SOLN
2.0000 g | INTRAVENOUS | Status: AC
  Administered 2020-11-20: 2 g via INTRAVENOUS
  Filled 2020-11-20: qty 100

## 2020-11-20 MED ORDER — LIDOCAINE 2% (20 MG/ML) 5 ML SYRINGE
INTRAMUSCULAR | Status: AC
  Filled 2020-11-20: qty 5

## 2020-11-20 MED ORDER — ACETAMINOPHEN 325 MG PO TABS
325.0000 mg | ORAL_TABLET | Freq: Four times a day (QID) | ORAL | Status: DC | PRN
Start: 2020-11-21 — End: 2020-11-20

## 2020-11-20 MED ORDER — PROMETHAZINE HCL 25 MG/ML IJ SOLN: 6.2500 mg | INTRAMUSCULAR | Status: DC | PRN

## 2020-11-20 MED ORDER — PHENYLEPHRINE HCL-NACL 10-0.9 MG/250ML-% IV SOLN
INTRAVENOUS | Status: DC | PRN
  Administered 2020-11-20: 40 ug/min via INTRAVENOUS

## 2020-11-20 MED ORDER — TRANEXAMIC ACID-NACL 1000-0.7 MG/100ML-% IV SOLN: 1000.0000 mg | Freq: Once | INTRAVENOUS | Status: DC

## 2020-11-20 MED ORDER — ASPIRIN EC 81 MG PO TBEC: 81.0000 mg | DELAYED_RELEASE_TABLET | Freq: Two times a day (BID) | ORAL | 0 refills | Status: DC

## 2020-11-20 MED ORDER — OXYCODONE HCL 5 MG PO TABS: 5.0000 mg | ORAL_TABLET | Freq: Four times a day (QID) | ORAL | 0 refills | Status: DC | PRN

## 2020-11-20 MED ORDER — ROCURONIUM BROMIDE 10 MG/ML (PF) SYRINGE
PREFILLED_SYRINGE | INTRAVENOUS | Status: AC
  Filled 2020-11-20: qty 10

## 2020-11-20 MED ORDER — ONDANSETRON HCL 4 MG/2ML IJ SOLN
4.0000 mg | Freq: Four times a day (QID) | INTRAMUSCULAR | Status: DC | PRN
Start: 2020-11-20 — End: 2020-11-20

## 2020-11-20 MED ORDER — ACETAMINOPHEN 500 MG PO TABS
1000.0000 mg | ORAL_TABLET | Freq: Once | ORAL | Status: AC
Start: 2020-11-20 — End: 2020-11-20
  Administered 2020-11-20: 1000 mg via ORAL
  Filled 2020-11-20: qty 2

## 2020-11-20 MED ORDER — DEXAMETHASONE SODIUM PHOSPHATE 4 MG/ML IJ SOLN
INTRAMUSCULAR | Status: DC | PRN
  Administered 2020-11-20: 10 mg via INTRAVENOUS

## 2020-11-20 MED ORDER — ACETAMINOPHEN 500 MG PO TABS: 1000.0000 mg | ORAL_TABLET | Freq: Once | ORAL | Status: DC

## 2020-11-20 MED ORDER — ACETAMINOPHEN 500 MG PO TABS: 1000.0000 mg | ORAL_TABLET | Freq: Four times a day (QID) | ORAL | Status: DC

## 2020-11-20 MED ORDER — HYDROMORPHONE HCL 1 MG/ML IJ SOLN
INTRAMUSCULAR | Status: DC | PRN
  Administered 2020-11-20: .5 mg via INTRAVENOUS

## 2020-11-20 MED ORDER — SODIUM CHLORIDE 0.9% FLUSH
INTRAVENOUS | Status: DC | PRN
  Administered 2020-11-20: 20 mL

## 2020-11-20 MED ORDER — FENTANYL CITRATE (PF) 100 MCG/2ML IJ SOLN: 25.0000 ug | INTRAMUSCULAR | Status: DC | PRN

## 2020-11-20 MED ORDER — EPHEDRINE 5 MG/ML INJ
INTRAVENOUS | Status: AC
  Filled 2020-11-20: qty 10

## 2020-11-20 MED ORDER — OXYCODONE HCL 5 MG PO TABS: 5.0000 mg | ORAL_TABLET | ORAL | Status: DC | PRN

## 2020-11-20 MED ORDER — METHOCARBAMOL 500 MG IVPB - SIMPLE MED
INTRAVENOUS | Status: AC
  Filled 2020-11-20: qty 50

## 2020-11-20 MED ORDER — TRAMADOL HCL 50 MG PO TABS: 50.0000 mg | ORAL_TABLET | Freq: Four times a day (QID) | ORAL | Status: DC

## 2020-11-20 MED ORDER — METOCLOPRAMIDE HCL 5 MG/ML IJ SOLN: 5.0000 mg | Freq: Three times a day (TID) | INTRAMUSCULAR | Status: DC | PRN

## 2020-11-20 MED ORDER — CEFAZOLIN SODIUM-DEXTROSE 2-4 GM/100ML-% IV SOLN
2.0000 g | Freq: Four times a day (QID) | INTRAVENOUS | Status: DC
Start: 2020-11-20 — End: 2020-11-20

## 2020-11-20 MED ORDER — 0.9 % SODIUM CHLORIDE (POUR BTL) OPTIME
TOPICAL | Status: DC | PRN
  Administered 2020-11-20: 1000 mL

## 2020-11-20 MED ORDER — ONDANSETRON HCL 4 MG/2ML IJ SOLN
INTRAMUSCULAR | Status: DC | PRN
  Administered 2020-11-20: 4 mg via INTRAVENOUS

## 2020-11-20 MED ORDER — CHLORHEXIDINE GLUCONATE 0.12 % MT SOLN
15.0000 mL | Freq: Once | OROMUCOSAL | Status: AC
  Administered 2020-11-20: 15 mL via OROMUCOSAL

## 2020-11-20 MED ORDER — OXYCODONE HCL 5 MG PO TABS: 10.0000 mg | ORAL_TABLET | ORAL | Status: DC | PRN

## 2020-11-20 MED ORDER — DEXAMETHASONE SODIUM PHOSPHATE 10 MG/ML IJ SOLN: 8.0000 mg | Freq: Once | INTRAMUSCULAR | Status: DC

## 2020-11-20 MED ORDER — HYDROMORPHONE HCL 2 MG/ML IJ SOLN
INTRAMUSCULAR | Status: AC
  Filled 2020-11-20: qty 1

## 2020-11-20 MED ORDER — SUGAMMADEX SODIUM 200 MG/2ML IV SOLN
INTRAVENOUS | Status: DC | PRN
  Administered 2020-11-20: 200 mg via INTRAVENOUS

## 2020-11-20 MED ORDER — ONDANSETRON HCL 4 MG PO TABS: 4.0000 mg | ORAL_TABLET | Freq: Four times a day (QID) | ORAL | Status: DC | PRN

## 2020-11-20 MED ORDER — OXYCODONE HCL 5 MG PO TABS
ORAL_TABLET | ORAL | Status: AC
  Administered 2020-11-20: 5 mg via ORAL
  Filled 2020-11-20: qty 1

## 2020-11-20 MED ORDER — FENTANYL CITRATE (PF) 100 MCG/2ML IJ SOLN
INTRAMUSCULAR | Status: AC
  Administered 2020-11-20: 50 ug via INTRAVENOUS
  Filled 2020-11-20: qty 2

## 2020-11-20 MED ORDER — AMISULPRIDE (ANTIEMETIC) 5 MG/2ML IV SOLN: 10.0000 mg | Freq: Once | INTRAVENOUS | Status: DC | PRN

## 2020-11-20 MED ORDER — ONDANSETRON HCL 4 MG PO TABS: 4.0000 mg | ORAL_TABLET | Freq: Every day | ORAL | 0 refills | Status: DC | PRN

## 2020-11-20 MED ORDER — METHOCARBAMOL 500 MG PO TABS: 500.0000 mg | ORAL_TABLET | Freq: Four times a day (QID) | ORAL | Status: DC | PRN

## 2020-11-20 MED ORDER — LACTATED RINGERS IV BOLUS
500.0000 mL | Freq: Once | INTRAVENOUS | Status: AC
  Administered 2020-11-20: 500 mL via INTRAVENOUS

## 2020-11-20 MED ORDER — ACETAMINOPHEN 500 MG PO TABS: 1000.0000 mg | ORAL_TABLET | Freq: Four times a day (QID) | ORAL | 0 refills | Status: DC | PRN

## 2020-11-20 MED ORDER — HYDROMORPHONE HCL 1 MG/ML IJ SOLN: 0.5000 mg | INTRAMUSCULAR | Status: DC | PRN

## 2020-11-20 MED ORDER — METHOCARBAMOL 500 MG PO TABS: 500.0000 mg | ORAL_TABLET | Freq: Three times a day (TID) | ORAL | 0 refills | Status: DC | PRN

## 2020-11-20 MED ORDER — MELOXICAM 15 MG PO TABS: 15.0000 mg | ORAL_TABLET | Freq: Every day | ORAL | 0 refills | Status: DC

## 2020-11-20 MED ORDER — FENTANYL CITRATE (PF) 250 MCG/5ML IJ SOLN
INTRAMUSCULAR | Status: AC
  Filled 2020-11-20: qty 5

## 2020-11-20 MED ORDER — FENTANYL CITRATE (PF) 100 MCG/2ML IJ SOLN
INTRAMUSCULAR | Status: AC
  Filled 2020-11-20: qty 2

## 2020-11-20 MED ORDER — METHOCARBAMOL 500 MG IVPB - SIMPLE MED
500.0000 mg | Freq: Four times a day (QID) | INTRAVENOUS | Status: DC | PRN
  Administered 2020-11-20: 500 mg via INTRAVENOUS

## 2020-11-20 MED ORDER — ROCURONIUM BROMIDE 10 MG/ML (PF) SYRINGE
PREFILLED_SYRINGE | INTRAVENOUS | Status: DC | PRN
  Administered 2020-11-20: 80 mg via INTRAVENOUS

## 2020-11-20 SURGICAL SUPPLY — 73 items
APL PRP STRL LF DISP 70% ISPRP (MISCELLANEOUS)
BAG SPEC THK2 15X12 ZIP CLS (MISCELLANEOUS)
BAG ZIPLOCK 12X15 (MISCELLANEOUS) IMPLANT
BLADE SAG 18X100X1.27 (BLADE) ×2 IMPLANT
BLADE SAW SAG 73X25 THK (BLADE) ×1
BLADE SAW SGTL 73X25 THK (BLADE) ×1 IMPLANT
BLADE SURG SZ10 CARB STEEL (BLADE) ×4 IMPLANT
BRUSH FEMORAL CANAL (MISCELLANEOUS) IMPLANT
CHLORAPREP W/TINT 26 (MISCELLANEOUS) ×2 IMPLANT
CLSR STERI-STRIP ANTIMIC 1/2X4 (GAUZE/BANDAGES/DRESSINGS) ×2 IMPLANT
COVER PERINEAL POST (MISCELLANEOUS) ×2 IMPLANT
COVER SURGICAL LIGHT HANDLE (MISCELLANEOUS) ×4 IMPLANT
COVER WAND RF STERILE (DRAPES) IMPLANT
DECANTER SPIKE VIAL GLASS SM (MISCELLANEOUS) ×4 IMPLANT
DRAPE IMP U-DRAPE 54X76 (DRAPES) ×4 IMPLANT
DRAPE INCISE IOBAN 66X45 STRL (DRAPES) ×2 IMPLANT
DRAPE ORTHO SPLIT 77X108 STRL (DRAPES) ×4
DRAPE STERI IOBAN 125X83 (DRAPES) ×2 IMPLANT
DRAPE SURG ORHT 6 SPLT 77X108 (DRAPES) ×2 IMPLANT
DRAPE U-SHAPE 47X51 STRL (DRAPES) ×6 IMPLANT
DRSG MEPILEX BORDER 4X8 (GAUZE/BANDAGES/DRESSINGS) ×3 IMPLANT
ELECT BLADE TIP CTD 4 INCH (ELECTRODE) ×2 IMPLANT
ELECT REM PT RETURN 15FT ADLT (MISCELLANEOUS) ×4 IMPLANT
EVACUATOR 1/8 PVC DRAIN (DRAIN) IMPLANT
FACESHIELD WRAPAROUND (MASK) ×8 IMPLANT
FACESHIELD WRAPAROUND OR TEAM (MASK) ×4 IMPLANT
GLOVE SRG 8 PF TXTR STRL LF DI (GLOVE) ×2 IMPLANT
GLOVE SURG ENC MOIS LTX SZ7.5 (GLOVE) ×8 IMPLANT
GLOVE SURG UNDER POLY LF SZ7.5 (GLOVE) ×4 IMPLANT
GLOVE SURG UNDER POLY LF SZ8 (GLOVE) ×4
GOWN STRL REUS W/ TWL LRG LVL3 (GOWN DISPOSABLE) ×1 IMPLANT
GOWN STRL REUS W/ TWL XL LVL3 (GOWN DISPOSABLE) ×1 IMPLANT
GOWN STRL REUS W/TWL LRG LVL3 (GOWN DISPOSABLE) ×2 IMPLANT
GOWN STRL REUS W/TWL XL LVL3 (GOWN DISPOSABLE) ×4 IMPLANT
HEAD BIOLOX HIP 36/-5 (Joint) IMPLANT
HIP BIOLOX HD 36/-5 (Joint) ×2 IMPLANT
HOLDER FOLEY CATH W/STRAP (MISCELLANEOUS) IMPLANT
INSERT TRIDENT POLY 36 0DEG (Insert) ×1 IMPLANT
KIT BASIN OR (CUSTOM PROCEDURE TRAY) ×1 IMPLANT
KIT TURNOVER KIT A (KITS) ×4 IMPLANT
MANIFOLD NEPTUNE II (INSTRUMENTS) ×3 IMPLANT
NS IRRIG 1000ML POUR BTL (IV SOLUTION) ×3 IMPLANT
PACK ANTERIOR HIP CUSTOM (KITS) ×2 IMPLANT
PACK TOTAL JOINT (CUSTOM PROCEDURE TRAY) ×1 IMPLANT
PENCIL SMOKE EVACUATOR (MISCELLANEOUS) IMPLANT
PRESSURIZER FEMORAL UNIV (MISCELLANEOUS) IMPLANT
PROTECTOR NERVE ULNAR (MISCELLANEOUS) ×3 IMPLANT
RETRIEVER SUT HEWSON (MISCELLANEOUS) ×2 IMPLANT
SCREW HEX LP 6.5X20 (Screw) ×1 IMPLANT
SHELL ACETABUL CLUSTER SZ 54 (Shell) ×1 IMPLANT
STAPLER VISISTAT 35W (STAPLE) ×1 IMPLANT
STEM 37MM HIP (Hips) ×1 IMPLANT
STRIP CLOSURE SKIN 1/4X4 (GAUZE/BANDAGES/DRESSINGS) ×1 IMPLANT
SUCTION FRAZIER HANDLE 12FR (TUBING) ×2
SUCTION TUBE FRAZIER 12FR DISP (TUBING) ×1 IMPLANT
SUT MNCRL AB 3-0 PS2 18 (SUTURE) ×2 IMPLANT
SUT MNCRL AB 4-0 PS2 18 (SUTURE) ×1 IMPLANT
SUT STRATAFIX 0 PDS 27 VIOLET (SUTURE) ×2
SUT VIC AB 0 CT1 36 (SUTURE) ×6 IMPLANT
SUT VIC AB 1 CT1 36 (SUTURE) ×2 IMPLANT
SUT VIC AB 1 CTX 36 (SUTURE)
SUT VIC AB 1 CTX36XBRD ANBCTR (SUTURE) ×2 IMPLANT
SUT VIC AB 2-0 CT1 27 (SUTURE) ×4
SUT VIC AB 2-0 CT1 TAPERPNT 27 (SUTURE) ×4 IMPLANT
SUT VLOC 180 0 24IN GS25 (SUTURE) ×1 IMPLANT
SUTURE STRATFX 0 PDS 27 VIOLET (SUTURE) ×1 IMPLANT
SYR 50ML LL SCALE MARK (SYRINGE) ×2 IMPLANT
TOWEL OR 17X26 10 PK STRL BLUE (TOWEL DISPOSABLE) ×2 IMPLANT
TOWEL OR NON WOVEN STRL DISP B (DISPOSABLE) ×2 IMPLANT
TRAY CATH INTERMITTENT SS 16FR (CATHETERS) IMPLANT
TRAY FOLEY MTR SLVR 16FR STAT (SET/KITS/TRAYS/PACK) ×1 IMPLANT
TUBE SUCTION HIGH CAP CLEAR NV (SUCTIONS) ×2 IMPLANT
WATER STERILE IRR 1000ML POUR (IV SOLUTION) ×6 IMPLANT

## 2020-11-20 NOTE — Discharge Instructions (Signed)
POST-OPERATIVE OPIOID TAPER INSTRUCTIONS: . It is important to wean off of your opioid medication as soon as possible. If you do not need pain medication after your surgery it is ok to stop day one. . Opioids include: o Codeine, Hydrocodone(Norco, Vicodin), Oxycodone(Percocet, oxycontin) and hydromorphone amongst others.  . Long term and even short term use of opiods can cause: o Increased pain response o Dependence o Constipation o Depression o Respiratory depression o And more.  . Withdrawal symptoms can include o Flu like symptoms o Nausea, vomiting o And more . Techniques to manage these symptoms o Hydrate well o Eat regular healthy meals o Stay active o Use relaxation techniques(deep breathing, meditating, yoga) . Do Not substitute Alcohol to help with tapering . If you have been on opioids for less than two weeks and do not have pain than it is ok to stop all together.  . Plan to wean off of opioids o This plan should start within one week post op of your joint replacement. o Maintain the same interval or time between taking each dose and first decrease the dose.  o Cut the total daily intake of opioids by one tablet each day o Next start to increase the time between doses. o The last dose that should be eliminated is the evening dose.    

## 2020-11-20 NOTE — Anesthesia Postprocedure Evaluation (Signed)
Anesthesia Post Note  Patient: Craig Mccann  Procedure(s) Performed: TOTAL HIP ARTHROPLASTY ANTERIOR APPROACH (Left Hip)     Patient location during evaluation: PACU Anesthesia Type: General Level of consciousness: awake Pain management: pain level controlled Vital Signs Assessment: post-procedure vital signs reviewed and stable Respiratory status: spontaneous breathing and respiratory function stable Cardiovascular status: stable Postop Assessment: no apparent nausea or vomiting Anesthetic complications: no   No complications documented.  Last Vitals:  Vitals:   11/20/20 1030 11/20/20 1039  BP: 114/85 (!) 127/92  Pulse: 68 67  Resp: 18 16  Temp:    SpO2: 94% 92%    Last Pain:  Vitals:   11/20/20 1039  TempSrc:   PainSc: 4                  Candra R Kawana Hegel

## 2020-11-20 NOTE — Anesthesia Procedure Notes (Addendum)
Procedure Name: Intubation Date/Time: 11/20/2020 7:37 AM Performed by: Claudia Desanctis, CRNA Pre-anesthesia Checklist: Patient identified, Emergency Drugs available, Suction available and Patient being monitored Patient Re-evaluated:Patient Re-evaluated prior to induction Oxygen Delivery Method: Circle system utilized Preoxygenation: Pre-oxygenation with 100% oxygen Induction Type: IV induction Ventilation: Mask ventilation without difficulty and Oral airway inserted - appropriate to patient size Laryngoscope Size: Mac and 4 Grade View: Grade II Tube type: Oral Tube size: 7.5 mm Number of attempts: 1 Airway Equipment and Method: Stylet Placement Confirmation: ETT inserted through vocal cords under direct vision,  positive ETCO2 and breath sounds checked- equal and bilateral Secured at: 22 cm Tube secured with: Tape Dental Injury: Teeth and Oropharynx as per pre-operative assessment

## 2020-11-20 NOTE — Interval H&P Note (Signed)
History and Physical Interval Note:  11/20/2020 7:26 AM  Craig Mccann  has presented today for surgery, with the diagnosis of djd left hip.  The various methods of treatment have been discussed with the patient and family. After consideration of risks, benefits and other options for treatment, the patient has consented to  Procedure(s): TOTAL HIP ARTHROPLASTY ANTERIOR APPROACH (Left) as a surgical intervention.  The patient's history has been reviewed, patient examined, no change in status, stable for surgery.  I have reviewed the patient's chart and labs.  Questions were answered to the patient's satisfaction.     Renette Butters

## 2020-11-20 NOTE — Transfer of Care (Signed)
Immediate Anesthesia Transfer of Care Note  Patient: Craig Mccann  Procedure(s) Performed: TOTAL HIP ARTHROPLASTY ANTERIOR APPROACH (Left Hip)  Patient Location: PACU  Anesthesia Type:General  Level of Consciousness: awake and patient cooperative  Airway & Oxygen Therapy: Patient Spontanous Breathing and Patient connected to face mask  Post-op Assessment: Report given to RN and Post -op Vital signs reviewed and stable  Post vital signs: Reviewed and stable  Last Vitals:  Vitals Value Taken Time  BP 143/97 11/20/20 0932  Temp    Pulse 79 11/20/20 0935  Resp 18 11/20/20 0935  SpO2 98 % 11/20/20 0935  Vitals shown include unvalidated device data.  Last Pain:  Vitals:   11/20/20 0600  TempSrc:   PainSc: 0-No pain         Complications: No complications documented.

## 2020-11-20 NOTE — Op Note (Signed)
11/20/2020  8:54 AM  PATIENT:  Craig Mccann   MRN: [id number]  PRE-OPERATIVE DIAGNOSIS:  djd left hip  POST-OPERATIVE DIAGNOSIS:  djd left hip  PROCEDURE:  Procedure(s): TOTAL HIP ARTHROPLASTY ANTERIOR APPROACH  PREOPERATIVE INDICATIONS:    Craig Mccann is an 70 y.o. male who has a diagnosis of <principal problem not specified> and elected for surgical management after failing conservative treatment.  The risks benefits and alternatives were discussed with the patient including but not limited to the risks of nonoperative treatment, versus surgical intervention including infection, bleeding, nerve injury, periprosthetic fracture, the need for revision surgery, dislocation, leg length discrepancy, blood clots, cardiopulmonary complications, morbidity, mortality, among others, and they were willing to proceed.     OPERATIVE REPORT     SURGEON:   Renette Butters, MD    ASSISTANT:  Aggie Moats, PA-C, he was present and scrubbed throughout the case, critical for completion in a timely fashion, and for retraction, instrumentation, and closure.     ANESTHESIA:  General    COMPLICATIONS:  None.     COMPONENTS:  Stryker acolade fit femur size 7 with a 36 mm -5 head ball and an acetabular shell size 54 with a  polyethylene liner    PROCEDURE IN DETAIL:   The patient was met in the holding area and  identified.  The appropriate hip was identified and marked at the operative site.  The patient was then transported to the OR  and  placed under anesthesia per that record.  At that point, the patient was  placed in the supine position and  secured to the operating room table and all bony prominences padded. He received pre-operative antibiotics    The operative lower extremity was prepped from the iliac crest to the distal leg.  Sterile draping was performed.  Time out was performed prior to incision.      Skin incision was made just 2 cm lateral to the ASIS  extending in line with  the tensor fascia lata. Electrocautery was used to control all bleeders. I dissected down sharply to the fascia of the tensor fascia lata was confirmed that the muscle fibers beneath were running posteriorly. I then incised the fascia over the superficial tensor fascia lata in line with the incision. The fascia was elevated off the anterior aspect of the muscle the muscle was retracted posteriorly and protected throughout the case. I then used electrocautery to incise the tensor fascia lata fascia control and all bleeders. Immediately visible was the fat over top of the anterior neck and capsule.  I removed the anterior fat from the capsule and elevated the rectus muscle off of the anterior capsule. I then removed a large time of capsule. The retractors were then placed over the anterior acetabulum as well as around the superior and inferior neck.  I then made a femoral neck cut. Then used the power corkscrew to remove the femoral head from the acetabulum and thoroughly irrigated the acetabulum. I sized the femoral head.    I then exposed the deep acetabulum, cleared out any tissue including the ligamentum teres.   After adequate visualization, I excised the labrum, and then sequentially reamed.  I then impacted the acetabular implant into place using fluoroscopy for guidance.  Appropriate version and inclination was confirmed clinically matching their bony anatomy, and with fluoroscopy.  I placed a 20 mm screw in the posterior/superio position with an excellent bite.    I then placed the polyethylene liner in  place  I then adducted the leg and released the external rotators from the posterior femur allowing it to be easily delivered up lateral and anterior to the acetabulum for preparation of the femoral canal.    I then prepared the proximal femur using the cookie-cutter and then sequentially reamed and broached.  A trial broach, neck, and head was utilized, and I reduced the hip and used  floroscopy to assess the neck length and femoral implant.  I then impacted the femoral prosthesis into place into the appropriate version. The hip was then reduced and fluoroscopy confirmed appropriate position. Leg lengths were restored.  I then irrigated the hip copiously again with, and repaired the fascia with Vicryl, followed by monocryl for the subcutaneous tissue, Monocryl for the skin, Steri-Strips and sterile gauze. The patient was then awakened and returned to PACU in stable and satisfactory condition. There were no complications.  POST OPERATIVE PLAN: WBAT, DVT px: SCD's/TED, ambulation and chemical dvt px  Edmonia Lynch, MD Orthopedic Surgeon [phone]

## 2020-11-20 NOTE — Evaluation (Signed)
Physical Therapy Evaluation Patient Details Name: Craig Mccann MRN: [id number] DOB: 1950-08-09 Today's Date: 11/20/2020   History of Present Illness  Patient is 70 y.o. male s/p Lt THA anterior approach on 11/20/20 with PMH significant for HLD, melanoma, COPD, CAD, Lt TKA in 2017.    Clinical Impression  Craig Mccann is a 70 y.o. male POD 0 s/p Lt THA. Patient reports independence with mobility at baseline. Patient is now limited by functional impairments (see PT problem list below) and requires min guard/supervision for transfers and gait with RW. Patient was able to ambulate ~100 feet with RW and min guard/supervision and cues for safe walker management. Patient educated on safe sequencing for stair mobility and verbalized safe guarding position for people assisting with mobility. Patient instructed in exercises to facilitate ROM and circulation. Patient will benefit from continued skilled PT interventions to address impairments and progress towards PLOF. Patient has met mobility goals at adequate level for discharge home; will continue to follow if pt continues acute stay to progress towards Mod I goals.     Follow Up Recommendations Follow surgeon's recommendation for DC plan and follow-up therapies    Equipment Recommendations  Rolling walker with 5" wheels    Recommendations for Other Services       Precautions / Restrictions Precautions Precautions: Fall Restrictions Weight Bearing Restrictions: No Other Position/Activity Restrictions: WBAT      Mobility  Bed Mobility Overal bed mobility: Needs Assistance Bed Mobility: Supine to Sit     Supine to sit: Supervision;HOB elevated     General bed mobility comments: no assist needed, pt taking extra time.    Transfers Overall transfer level: Needs assistance Equipment used: Rolling walker (2 wheeled) Transfers: Sit to/from Stand Sit to Stand: Min guard;Supervision         General transfer comment: VC's for  safety with hand placement and min guard/supervision for power up.  Ambulation/Gait Ambulation/Gait assistance: Supervision;Min guard Gait Distance (Feet): 100 Feet Assistive device: Rolling walker (2 wheeled) Gait Pattern/deviations: Step-to pattern;Decreased stride length;Decreased weight shift to left Gait velocity: decr   General Gait Details: cues for safe step pattern and proximity to RW, intermittent cues to keep walker on floor. no overt LOB noted, pt's wife instructed on safe guarding position to provide during gait.  Stairs Stairs: Yes Stairs assistance: Min guard;Supervision Stair Management: Two rails;Step to pattern;Forwards Number of Stairs: 3 General stair comments: VC's for step sequencing and for safe guarding position for family to provide. Wife guarded pt with supervision from therapist. no LOB noted.  Wheelchair Mobility    Modified Rankin (Stroke Patients Only)       Balance Overall balance assessment: Needs assistance;Mild deficits observed, not formally tested Sitting-balance support: Feet supported Sitting balance-Leahy Scale: Good     Standing balance support: During functional activity;Bilateral upper extremity supported Standing balance-Leahy Scale: Fair                               Pertinent Vitals/Pain Pain Assessment: Faces Faces Pain Scale: Hurts a little bit Pain Location: Lt hip Pain Descriptors / Indicators: Aching;Discomfort Pain Intervention(s): Limited activity within patient's tolerance;Monitored during session;Repositioned    Home Living Family/patient expects to be discharged to:: Private residence Living Arrangements: Spouse/significant other Available Help at Discharge: Family Type of Home: House Home Access: Stairs to enter Entrance Stairs-Rails: Can reach both Entrance Stairs-Number of Steps: 5+1 Home Layout: One level Home Equipment: Cane - single  point;Toilet riser      Prior Function Level of  Independence: Independent               Hand Dominance   Dominant Hand: Right    Extremity/Trunk Assessment   Upper Extremity Assessment Upper Extremity Assessment: Overall WFL for tasks assessed    Lower Extremity Assessment Lower Extremity Assessment: Overall WFL for tasks assessed    Cervical / Trunk Assessment Cervical / Trunk Assessment: Normal  Communication   Communication: No difficulties  Cognition Arousal/Alertness: Awake/alert Behavior During Therapy: WFL for tasks assessed/performed Overall Cognitive Status: Within Functional Limits for tasks assessed                                        General Comments      Exercises Total Joint Exercises Ankle Circles/Pumps: AROM;Both;20 reps;Seated Quad Sets: AROM;Left;5 reps;Seated Short Arc Quad: AROM;Left;5 reps;Seated Heel Slides: AROM;Left;5 reps;Seated Hip ABduction/ADduction: AROM;Left;5 reps;Seated   Assessment/Plan    PT Assessment Patient needs continued PT services  PT Problem List Decreased strength;Decreased range of motion;Decreased activity tolerance;Decreased balance;Decreased mobility;Decreased knowledge of use of DME;Decreased safety awareness;Decreased knowledge of precautions       PT Treatment Interventions DME instruction;Stair training;Gait training;Functional mobility training;Therapeutic activities;Therapeutic exercise;Balance training;Patient/family education    PT Goals (Current goals can be found in the Care Plan section)  Acute Rehab PT Goals Patient Stated Goal: get home today PT Goal Formulation: With patient/family Time For Goal Achievement: 11/27/20 Potential to Achieve Goals: Good    Frequency 7X/week   Barriers to discharge        Co-evaluation               AM-PAC PT "6 Clicks" Mobility  Outcome Measure Help needed turning from your back to your side while in a flat bed without using bedrails?: None Help needed moving from lying on your  back to sitting on the side of a flat bed without using bedrails?: A Little Help needed moving to and from a bed to a chair (including a wheelchair)?: A Little Help needed standing up from a chair using your arms (e.g., wheelchair or bedside chair)?: A Little Help needed to walk in hospital room?: A Little Help needed climbing 3-5 steps with a railing? : A Little 6 Click Score: 19    End of Session Equipment Utilized During Treatment: Gait belt Activity Tolerance: Patient tolerated treatment well Patient left: in chair;with call bell/phone within reach;with family/visitor present Nurse Communication: Mobility status PT Visit Diagnosis: Muscle weakness (generalized) (M62.81);Difficulty in walking, not elsewhere classified (R26.2)    Time: 1132-1205 PT Time Calculation (min) (ACUTE ONLY): 33 min   Charges:   PT Evaluation $PT Eval Low Complexity: 1 Low PT Treatments $Gait Training: 8-22 mins        Verner Mould, DPT Acute Rehabilitation Services Office [phone] Pager [phone]     Jacques Navy 11/20/2020, 1:50 PM

## 2020-11-21 ENCOUNTER — Encounter (HOSPITAL_COMMUNITY): Payer: Self-pay | Admitting: Orthopedic Surgery

## 2020-11-22 DIAGNOSIS — M25552 Pain in left hip: Secondary | ICD-10-CM | POA: Diagnosis not present

## 2020-11-22 NOTE — Progress Notes (Signed)
Attempted to waste Fentanyl in Pyxis, unable to find patient. Wasted 147mcg with witness, Marylyn Ishihara, RN, in stericycle.

## 2020-11-27 DIAGNOSIS — M25552 Pain in left hip: Secondary | ICD-10-CM | POA: Diagnosis not present

## 2020-11-30 DIAGNOSIS — M1612 Unilateral primary osteoarthritis, left hip: Secondary | ICD-10-CM | POA: Diagnosis not present

## 2020-12-04 DIAGNOSIS — M25552 Pain in left hip: Secondary | ICD-10-CM | POA: Diagnosis not present

## 2020-12-06 DIAGNOSIS — M25552 Pain in left hip: Secondary | ICD-10-CM | POA: Diagnosis not present

## 2020-12-11 DIAGNOSIS — M25552 Pain in left hip: Secondary | ICD-10-CM | POA: Diagnosis not present

## 2020-12-12 DIAGNOSIS — M25562 Pain in left knee: Secondary | ICD-10-CM | POA: Diagnosis not present

## 2020-12-13 DIAGNOSIS — M25552 Pain in left hip: Secondary | ICD-10-CM | POA: Diagnosis not present

## 2020-12-18 DIAGNOSIS — M25552 Pain in left hip: Secondary | ICD-10-CM | POA: Diagnosis not present

## 2020-12-20 DIAGNOSIS — M25552 Pain in left hip: Secondary | ICD-10-CM | POA: Diagnosis not present

## 2020-12-28 DIAGNOSIS — M1612 Unilateral primary osteoarthritis, left hip: Secondary | ICD-10-CM | POA: Diagnosis not present

## 2021-01-02 ENCOUNTER — Encounter: Payer: Self-pay | Admitting: *Deleted

## 2021-01-30 DIAGNOSIS — L570 Actinic keratosis: Secondary | ICD-10-CM | POA: Diagnosis not present

## 2021-04-03 DIAGNOSIS — R7301 Impaired fasting glucose: Secondary | ICD-10-CM | POA: Diagnosis not present

## 2021-04-03 DIAGNOSIS — J984 Other disorders of lung: Secondary | ICD-10-CM | POA: Diagnosis not present

## 2021-04-03 DIAGNOSIS — E782 Mixed hyperlipidemia: Secondary | ICD-10-CM | POA: Diagnosis not present

## 2021-04-03 DIAGNOSIS — N1831 Chronic kidney disease, stage 3a: Secondary | ICD-10-CM | POA: Diagnosis not present

## 2021-04-03 DIAGNOSIS — J449 Chronic obstructive pulmonary disease, unspecified: Secondary | ICD-10-CM | POA: Diagnosis not present

## 2021-04-03 DIAGNOSIS — R0600 Dyspnea, unspecified: Secondary | ICD-10-CM | POA: Diagnosis not present

## 2021-04-03 DIAGNOSIS — I255 Ischemic cardiomyopathy: Secondary | ICD-10-CM | POA: Diagnosis not present

## 2021-04-09 DIAGNOSIS — U071 COVID-19: Secondary | ICD-10-CM | POA: Diagnosis not present

## 2021-04-15 ENCOUNTER — Ambulatory Visit (HOSPITAL_COMMUNITY)
Admission: RE | Admit: 2021-04-15 | Discharge: 2021-04-15 | Disposition: A | Discharge status: Home / Self Care | Payer: Medicare HMO | Source: Ambulatory Visit | Attending: Adult Health Nurse Practitioner | Admitting: Adult Health Nurse Practitioner

## 2021-04-15 ENCOUNTER — Other Ambulatory Visit (HOSPITAL_COMMUNITY): Payer: Self-pay | Admitting: Adult Health Nurse Practitioner

## 2021-04-15 ENCOUNTER — Other Ambulatory Visit: Payer: Self-pay

## 2021-04-15 DIAGNOSIS — I255 Ischemic cardiomyopathy: Secondary | ICD-10-CM

## 2021-04-15 DIAGNOSIS — J439 Emphysema, unspecified: Secondary | ICD-10-CM | POA: Diagnosis not present

## 2021-04-15 DIAGNOSIS — R0602 Shortness of breath: Secondary | ICD-10-CM | POA: Diagnosis not present

## 2021-05-01 DIAGNOSIS — I25119 Atherosclerotic heart disease of native coronary artery with unspecified angina pectoris: Secondary | ICD-10-CM | POA: Diagnosis not present

## 2021-05-01 DIAGNOSIS — G4709 Other insomnia: Secondary | ICD-10-CM | POA: Diagnosis not present

## 2021-05-01 DIAGNOSIS — I255 Ischemic cardiomyopathy: Secondary | ICD-10-CM | POA: Diagnosis not present

## 2021-05-01 DIAGNOSIS — J439 Emphysema, unspecified: Secondary | ICD-10-CM | POA: Diagnosis not present

## 2021-05-01 DIAGNOSIS — K2960 Other gastritis without bleeding: Secondary | ICD-10-CM | POA: Diagnosis not present

## 2021-05-01 DIAGNOSIS — N1831 Chronic kidney disease, stage 3a: Secondary | ICD-10-CM | POA: Diagnosis not present

## 2021-05-06 ENCOUNTER — Ambulatory Visit: Payer: Medicare HMO | Admitting: Cardiology

## 2021-05-06 ENCOUNTER — Encounter: Payer: Self-pay | Admitting: Cardiology

## 2021-05-06 ENCOUNTER — Other Ambulatory Visit: Payer: Self-pay

## 2021-05-06 VITALS — BP 130/80 | HR 94 | Ht 74.0 in | Wt 245.0 lb

## 2021-05-06 DIAGNOSIS — I2510 Atherosclerotic heart disease of native coronary artery without angina pectoris: Secondary | ICD-10-CM

## 2021-05-06 DIAGNOSIS — I5022 Chronic systolic (congestive) heart failure: Secondary | ICD-10-CM | POA: Diagnosis not present

## 2021-05-06 MED ORDER — LOSARTAN POTASSIUM 25 MG PO TABS: 12.5000 mg | ORAL_TABLET | Freq: Every day | ORAL | 6 refills | Status: DC

## 2021-05-06 MED ORDER — CARVEDILOL 3.125 MG PO TABS: 3.1250 mg | ORAL_TABLET | Freq: Two times a day (BID) | ORAL | 6 refills | Status: DC

## 2021-05-06 NOTE — Progress Notes (Signed)
Clinical Summary Mr. Cozort is a 70 y.o.male seen today for follow up of the following medical problems.   1. Chest pain/CA 06/2017 nuclear stress: moderate mid inferoseptal and mid inferior defect with peri-infarct ischemia. LVEF 46% - 06/2017 echo LVEF 25-05%, grade I diastolic dysfunction    10/9765 cath: LAD patent, D1 20%, ramus 25%, LCX 65% prox, RCA mid 15% and distal 30% - no recent chest pains.   2. Chronic systolic HF New diagnosis of systolic HF in 10/4191 02/9023 echo LVEF 30-35%, mod to severe asymmetric septal hypertrophy, grade I dd,  10/2020 cath: LAD patent, D1 20%, ramus 25%, LCX 65% prox, RCA mid 15% and distal 30%  - reported side effects on coreg 3.125mg  bid initially and stopped taking. Has started taking once daily on his own and tolerating.    - chronic SOB he relates to his COPD - no recent edema     Past Medical History:  Diagnosis Date   Acute prostatitis 01/2015   Allergy    Basal cell carcinoma 2017   Multiple: back, left shin, left arm   CAD (coronary artery disease)    a. 07/2017: cath showing 50-60% stenosis along the LCx with a FFR of 0.94 not being hemodynamically significant   CAP (community acquired pneumonia) 06/26/2015   Chronic renal insufficiency, stage II (mild)    stage II/II: CrCl 60s.   Complication of anesthesia    COPD (chronic obstructive pulmonary disease) ([place]) 05/07/15   Long time smoker + COPD changes noted on lung ca screening CT   Diverticulitis    Encounter for screening for lung cancer    Screening CT ok 08/27/16--repeat in 1 yr recommended.   Family history of colon cancer in mother    Hearing impairment    hearing aids   History of adenomatous polyp of colon 12/24/10   History of kidney stones    Hyperlipidemia 07/14/2016   started atorv 20 07/16/16   Kidney stones    bilat nonobstrucing renal calculi 2016, + left benign renal cysts   Melanoma ([place])    R posterolateral neck and R nasal ala.  Most recent was  left mid back 01/2016 (Dr. Tarri Glenn).   Spinal headache    Tobacco dependence    CT chest for lung ca screning 04/2015 showed benign findings; 1 yr repeat recommended     Allergies  Allergen Reactions   Codeine Hives   Lexiscan [Regadenoson] Other (See Comments)    Transient Heart Block   Valium [Diazepam] Anaphylaxis     Current Outpatient Medications  Medication Sig Dispense Refill   acetaminophen (TYLENOL) 500 MG tablet Take 2 tablets (1,000 mg total) by mouth every 6 (six) hours as needed for mild pain or moderate pain. 60 tablet 0   albuterol (PROAIR HFA) 108 (90 BASE) MCG/ACT inhaler Inhale 2 puffs into the lungs every 6 (six) hours as needed for wheezing or shortness of breath. 1 Inhaler 1   aspirin EC 81 MG tablet Take 1 tablet (81 mg total) by mouth 2 (two) times daily. For DVT prophylaxis for 30 days after surgery. 60 tablet 0   atorvastatin (LIPITOR) 20 MG tablet Take 1 tablet (20 mg total) by mouth daily. (Patient taking differently: Take 20 mg by mouth every evening.) 30 tablet 6   isosorbide mononitrate (IMDUR) 30 MG 24 hr tablet Take 1 tablet (30 mg total) by mouth daily. (Patient taking differently: Take 30 mg by mouth every evening.) 30 tablet 6  losartan (COZAAR) 25 MG tablet Take 0.5 tablets (12.5 mg total) by mouth daily. (Patient taking differently: Take 12.5 mg by mouth every evening.) 15 tablet 6   meloxicam (MOBIC) 15 MG tablet Take 1 tablet (15 mg total) by mouth daily. 30 tablet 0   methocarbamol (ROBAXIN) 500 MG tablet Take 1 tablet (500 mg total) by mouth every 8 (eight) hours as needed for muscle spasms. 20 tablet 0   nitroGLYCERIN (NITROSTAT) 0.4 MG SL tablet Place 1 tablet (0.4 mg total) under the tongue every 5 (five) minutes as needed for chest pain. (Patient taking differently: Place 0.4 mg under the tongue every 5 (five) minutes x 3 doses as needed for chest pain.) 25 tablet 3   omeprazole (PRILOSEC OTC) 20 MG tablet Take 1 tablet (20 mg total) by mouth  daily. For gastric protection 30 tablet 0   ondansetron (ZOFRAN) 4 MG tablet Take 1 tablet (4 mg total) by mouth daily as needed for nausea or vomiting. 10 tablet 0   oxyCODONE (ROXICODONE) 5 MG immediate release tablet Take 1 tablet (5 mg total) by mouth every 6 (six) hours as needed for severe pain. Do not take more than 6 tablets in a 24 hour period. 28 tablet 0   No current facility-administered medications for this visit.     Past Surgical History:  Procedure Laterality Date   CERVICAL FUSION     CHOLECYSTECTOMY  1987   COLONOSCOPY  12/24/10; 2014   Multiple TCSs: pt on 5 yr recall, next due 2019.   COLONOSCOPY N/A 10/09/2017   Procedure: COLONOSCOPY;  Surgeon: Danie Binder, MD;  Location: [facility];  Service: Endoscopy;  Laterality: N/A;  1:30   INGUINAL HERNIA REPAIR  1984   INTRAVASCULAR PRESSURE WIRE/FFR STUDY N/A 07/14/2017   Procedure: INTRAVASCULAR PRESSURE WIRE/FFR STUDY;  Surgeon: Belva Crome, MD;  Location: [facility];  Service: Cardiovascular;  Laterality: N/A;   JOINT REPLACEMENT     left   KNEE SURGERY Left 2015   cartilage repair   KNEE SURGERY Right 1997   "          "   LEFT HEART CATH AND CORONARY ANGIOGRAPHY N/A 07/14/2017   Procedure: LEFT HEART CATH AND CORONARY ANGIOGRAPHY;  Surgeon: Belva Crome, MD;  Location: [facility];  Service: Cardiovascular;  Laterality: N/A;   LEFT HEART CATH AND CORONARY ANGIOGRAPHY N/A 10/25/2020   Procedure: LEFT HEART CATH AND CORONARY ANGIOGRAPHY;  Surgeon: Troy Sine, MD;  Location: [facility];  Service: Cardiovascular;  Laterality: N/A;   LITHOTRIPSY     NECK SURGERY  1989   Thumb surgery Bilateral 2005   and 2006   TOTAL HIP ARTHROPLASTY Left 11/20/2020   Procedure: TOTAL HIP ARTHROPLASTY ANTERIOR APPROACH;  Surgeon: Renette Butters, MD;  Location: [facility];  Service: Orthopedics;  Laterality: Left;   TOTAL KNEE ARTHROPLASTY Left 04/23/2016   Procedure: TOTAL KNEE ARTHROPLASTY;  Surgeon:  Ninetta Lights, MD;  Location: [facility];  Service: Orthopedics;  Laterality: Left;     Allergies  Allergen Reactions   Codeine Hives   Lexiscan [Regadenoson] Other (See Comments)    Transient Heart Block   Valium [Diazepam] Anaphylaxis      Family History  Problem Relation Age of Onset   Colon cancer Mother    Cancer Mother    Lung cancer Father    Cancer Father    Brain cancer Brother    Cancer Brother  Social History Mr. Paff reports that he has been smoking cigarettes. He started smoking about 52 years ago. He has a 20.00 pack-year smoking history. He has never used smokeless tobacco. Mr. Gildersleeve reports no history of alcohol use.   Review of Systems CONSTITUTIONAL: No weight loss, fever, chills, weakness or fatigue.  HEENT: Eyes: No visual loss, blurred vision, double vision or yellow sclerae.No hearing loss, sneezing, congestion, runny nose or sore throat.  SKIN: No rash or itching.  CARDIOVASCULAR: per hpi RESPIRATORY: per hpi GASTROINTESTINAL: No anorexia, nausea, vomiting or diarrhea. No abdominal pain or blood.  GENITOURINARY: No burning on urination, no polyuria NEUROLOGICAL: No headache, dizziness, syncope, paralysis, ataxia, numbness or tingling in the extremities. No change in bowel or bladder control.  MUSCULOSKELETAL: No muscle, back pain, joint pain or stiffness.  LYMPHATICS: No enlarged nodes. No history of splenectomy.  PSYCHIATRIC: No history of depression or anxiety.  ENDOCRINOLOGIC: No reports of sweating, cold or heat intolerance. No polyuria or polydipsia.  [name]   Physical Examination Today's Vitals   05/06/21 1519  BP: 130/80  Pulse: 94  SpO2: 95%  Weight: 245 lb (111.1 kg)  Height: 6\' 2"  (1.88 m)   Body mass index is 31.46 kg/m.  Gen: resting comfortably, no acute distress HEENT: no scleral icterus, pupils equal round and reactive, no palptable cervical adenopathy,  CV: RRR, no m/r/g no jvd Resp: Clear to auscultation  bilaterally GI: abdomen is soft, non-tender, non-distended, normal bowel sounds, no hepatosplenomegaly MSK: extremities are warm, no edema.  Skin: warm, no rash Neuro:  no focal deficits Psych: appropriate affect   Diagnostic Studies 06/2017 nuclear stress There was no ST segment deviation noted during stress. Defect 1: There is a medium defect of moderate severity present in the mid inferoseptal and mid inferior location. Findings consistent with prior myocardial infarction with peri-infarct ischemia. Variable soft tissue attenuation artifact is also contributing to defect. This is a low to intermediate risk study. Nuclear stress EF: 46%.   11.2018 echoStudy Conclusions   - Left ventricle: The cavity size was normal. Wall thickness was   increased increased in a pattern of mild to moderate LVH.   Systolic function was mildly reduced. The estimated ejection   fraction was in the range of 45% to 50%. Doppler parameters are   consistent with abnormal left ventricular relaxation (grade 1   diastolic dysfunction). - Aortic valve: Valve area (VTI): 1.97 cm^2. Valve area (Vmax):   2.04 cm^2. Valve area (Vmean): 2 cm^2. - Technically adequate study.  10/2020 cath Ost 1st Diag lesion is 20% stenosed. Dist RCA lesion is 30% stenosed. Ramus lesion is 25% stenosed. Prox Cx to Mid Cx lesion is 65% stenosed. Mid RCA lesion is 15% stenosed.   Mild to moderate nonobstructive CAD with 20% irregularity in the first diagonal Trudi Morgenthaler of the LAD, 20% stenosis in the ramus intermediate vessel, smooth 20% proximal circumflex stenosis with no change in the previously noted 65% mid AV groove circumflex stenosis, and mild 15 and 30% smooth narrowings in a dominant RCA.   No significant change since the prior catheterization of 2019.   LVEDP 15 mmHg.   RECOMMENDATION: Guideline directed medical therapy for nonischemic cardiomyopathy with reduced EF at 30 to 35%.  The patient will return to Dr. Harl Bowie.   Smoking cessation is essential.  Aggressive lipid-lowering therapy with target LDL less than 70   10/2020 echo IMPRESSIONS     1. Left ventricular ejection fraction, by estimation, is 30 to 35%. The  left ventricle has moderately decreased function. The left ventricle  demonstrates global hypokinesis. The left ventricular internal cavity size  was mildly dilated. There is moderate   to severe asymmetric left ventricular hypertrophy of the septal segment.  Left ventricular diastolic parameters are consistent with Grade I  diastolic dysfunction (impaired relaxation).   2. Right ventricular systolic function is normal. The right ventricular  size is normal. There is normal pulmonary artery systolic pressure. The  estimated right ventricular systolic pressure is 65.6 mmHg.   3. There is a trivial pericardial effusion anterior to the right  ventricle.   4. The mitral valve is abnormal. Trivial mitral valve regurgitation.   5. The aortic valve is tricuspid. There is mild calcification of the  aortic valve. Aortic valve regurgitation is not visualized. Mild aortic  valve sclerosis is present, with no evidence of aortic valve stenosis.   6. The inferior vena cava is normal in size with greater than 50%  respiratory variability, suggesting right atrial pressure of 3 mmHg.     Assessment and Plan  1. Chronic systolic HF - new diagnosis 10/2020 - no recent symptoms - some reported side effects and some initial reluctance to medical therapy initially. He is very willing to retry medications at this time. Try coreg 3.125mg  bid again, if trouble tolerating would change to bisoprolol 2.5mg  daily. Restart losartan 12.5mg  daily, depending on renal function consider transitioning to entresto over time. Check BMET in 2 weeks   2. CAD - mild to moderate CAD by recent cath - no recent symptoms, continuw ASA and statin   F/u with Dr Harl Bowie 1 month      Arnoldo Lenis, M.D.

## 2021-05-06 NOTE — Patient Instructions (Signed)
Medication Instructions:  Begin Coreg 3.125mg  twice a day. Begin Losartan 12.5mg  daily.  Continue all other medications.     Labwork: BMET - order given today.  Please do in 2 weeks (around 05/20/2021).  Office will contact with results via phone or letter.     Testing/Procedures: none  Follow-Up: 1 month   Any Other Special Instructions Will Be Listed Below (If Applicable).   If you need a refill on your cardiac medications before your next appointment, please call your pharmacy.

## 2021-06-06 ENCOUNTER — Ambulatory Visit: Payer: Medicare HMO | Admitting: Cardiology

## 2021-06-06 ENCOUNTER — Encounter: Payer: Self-pay | Admitting: Cardiology

## 2021-06-06 VITALS — BP 126/78 | HR 94 | Ht 74.0 in | Wt 245.0 lb

## 2021-06-06 DIAGNOSIS — I2510 Atherosclerotic heart disease of native coronary artery without angina pectoris: Secondary | ICD-10-CM | POA: Diagnosis not present

## 2021-06-06 DIAGNOSIS — I5022 Chronic systolic (congestive) heart failure: Secondary | ICD-10-CM

## 2021-06-06 MED ORDER — CARVEDILOL 6.25 MG PO TABS: 6.2500 mg | ORAL_TABLET | Freq: Two times a day (BID) | ORAL | 6 refills | Status: DC

## 2021-06-06 NOTE — Progress Notes (Signed)
Clinical Summary Craig Mccann is a 70 y.o.male seen today for follow up of the following medical problems.    1. Chest pain/CA 06/2017 nuclear stress: moderate mid inferoseptal and mid inferior defect with peri-infarct ischemia. LVEF 46% - 06/2017 echo LVEF 11-57%, grade I diastolic dysfunction     09/6201 cath: LAD patent, D1 20%, ramus 25%, LCX 65% prox, RCA mid 15% and distal 30% - no recent chest pains.    2. Chronic systolic HF New diagnosis of systolic HF in 12/5972 08/6382 echo LVEF 30-35%, mod to severe asymmetric septal hypertrophy, grade I dd,  10/2020 cath: LAD patent, D1 20%, ramus 25%, LCX 65% prox, RCA mid 15% and distal 30%        -no SOB/DOE, no LE edema - tolerating coreg 3.125mg  bid and losartan 12.5mg  daily - did not go for bmet since last visit      Past Medical History:  Diagnosis Date   Acute prostatitis 01/2015   Allergy    Basal cell carcinoma 2017   Multiple: back, left shin, left arm   CAD (coronary artery disease)    a. 07/2017: cath showing 50-60% stenosis along the LCx with a FFR of 0.94 not being hemodynamically significant   CAP (community acquired pneumonia) 06/26/2015   Chronic renal insufficiency, stage II (mild)    stage II/II: CrCl 60s.   Complication of anesthesia    COPD (chronic obstructive pulmonary disease) ([place]) 05/07/15   Long time smoker + COPD changes noted on lung ca screening CT   Diverticulitis    Encounter for screening for lung cancer    Screening CT ok 08/27/16--repeat in 1 yr recommended.   Family history of colon cancer in mother    Hearing impairment    hearing aids   History of adenomatous polyp of colon 12/24/10   History of kidney stones    Hyperlipidemia 07/14/2016   started atorv 20 07/16/16   Kidney stones    bilat nonobstrucing renal calculi 2016, + left benign renal cysts   Melanoma ([place])    R posterolateral neck and R nasal ala.  Most recent was left mid back 01/2016 (Dr. Tarri Glenn).   Spinal headache     Tobacco dependence    CT chest for lung ca screning 04/2015 showed benign findings; 1 yr repeat recommended     Allergies  Allergen Reactions   Codeine Hives   Lexiscan [Regadenoson] Other (See Comments)    Transient Heart Block   Valium [Diazepam] Anaphylaxis     Current Outpatient Medications  Medication Sig Dispense Refill   albuterol (PROAIR HFA) 108 (90 BASE) MCG/ACT inhaler Inhale 2 puffs into the lungs every 6 (six) hours as needed for wheezing or shortness of breath. 1 Inhaler 1   aspirin EC 81 MG tablet Take 1 tablet (81 mg total) by mouth 2 (two) times daily. For DVT prophylaxis for 30 days after surgery. 60 tablet 0   Budeson-Glycopyrrol-Formoterol 160-9-4.8 MCG/ACT AERO Inhale 2 puffs into the lungs 2 (two) times daily.     carvedilol (COREG) 3.125 MG tablet Take 1 tablet (3.125 mg total) by mouth 2 (two) times daily. 60 tablet 6   famotidine (PEPCID) 40 MG tablet Take 40 mg by mouth 2 (two) times daily.     losartan (COZAAR) 25 MG tablet Take 0.5 tablets (12.5 mg total) by mouth daily. 15 tablet 6   nitroGLYCERIN (NITROSTAT) 0.4 MG SL tablet Place 0.4 mg under the tongue every 5 (five) minutes as needed  for chest pain.     omeprazole (PRILOSEC OTC) 20 MG tablet Take 1 tablet (20 mg total) by mouth daily. For gastric protection 30 tablet 0   No current facility-administered medications for this visit.     Past Surgical History:  Procedure Laterality Date   CERVICAL FUSION     CHOLECYSTECTOMY  1987   COLONOSCOPY  12/24/10; 2014   Multiple TCSs: pt on 5 yr recall, next due 2019.   COLONOSCOPY N/A 10/09/2017   Procedure: COLONOSCOPY;  Surgeon: Danie Binder, MD;  [facility];  Service: Endoscopy;  Laterality: N/A;  1:30   INGUINAL HERNIA REPAIR  1984   INTRAVASCULAR PRESSURE WIRE/FFR STUDY N/A 07/14/2017   Procedure: INTRAVASCULAR PRESSURE WIRE/FFR STUDY;  Surgeon: Belva Crome, MD;  Location: [facility];  Service: Cardiovascular;  Laterality: N/A;    JOINT REPLACEMENT     left   KNEE SURGERY Left 2015   cartilage repair   KNEE SURGERY Right 1997   "          "   LEFT HEART CATH AND CORONARY ANGIOGRAPHY N/A 07/14/2017   Procedure: LEFT HEART CATH AND CORONARY ANGIOGRAPHY;  Surgeon: Belva Crome, MD;  Location: [facility];  Service: Cardiovascular;  Laterality: N/A;   LEFT HEART CATH AND CORONARY ANGIOGRAPHY N/A 10/25/2020   Procedure: LEFT HEART CATH AND CORONARY ANGIOGRAPHY;  Surgeon: Troy Sine, MD;  Location: [facility];  Service: Cardiovascular;  Laterality: N/A;   LITHOTRIPSY     NECK SURGERY  1989   Thumb surgery Bilateral 2005   and 2006   TOTAL HIP ARTHROPLASTY Left 11/20/2020   Procedure: TOTAL HIP ARTHROPLASTY ANTERIOR APPROACH;  Surgeon: Renette Butters, MD;  Location: [facility];  Service: Orthopedics;  Laterality: Left;   TOTAL KNEE ARTHROPLASTY Left 04/23/2016   Procedure: TOTAL KNEE ARTHROPLASTY;  Surgeon: Ninetta Lights, MD;  Location: [facility];  Service: Orthopedics;  Laterality: Left;     Allergies  Allergen Reactions   Codeine Hives   Lexiscan [Regadenoson] Other (See Comments)    Transient Heart Block   Valium [Diazepam] Anaphylaxis      Family History  Problem Relation Age of Onset   Colon cancer Mother    Cancer Mother    Lung cancer Father    Cancer Father    Brain cancer Brother    Cancer Brother      Social History Craig Mccann reports that he has been smoking cigarettes. He started smoking about 52 years ago. He has a 20.00 pack-year smoking history. He has never used smokeless tobacco. Craig Mccann reports no history of alcohol use.   Review of Systems CONSTITUTIONAL: No weight loss, fever, chills, weakness or fatigue.  HEENT: Eyes: No visual loss, blurred vision, double vision or yellow sclerae.No hearing loss, sneezing, congestion, runny nose or sore throat.  SKIN: No rash or itching.  CARDIOVASCULAR: per hpi RESPIRATORY: No shortness of breath, cough or sputum.   GASTROINTESTINAL: No anorexia, nausea, vomiting or diarrhea. No abdominal pain or blood.  GENITOURINARY: No burning on urination, no polyuria NEUROLOGICAL: No headache, dizziness, syncope, paralysis, ataxia, numbness or tingling in the extremities. No change in bowel or bladder control.  MUSCULOSKELETAL: No muscle, back pain, joint pain or stiffness.  LYMPHATICS: No enlarged nodes. No history of splenectomy.  PSYCHIATRIC: No history of depression or anxiety.  ENDOCRINOLOGIC: No reports of sweating, cold or heat intolerance. No polyuria or polydipsia.  [name]   Physical  Examination Today's Vitals   06/06/21 1342  BP: 126/78  Pulse: 94  SpO2: 97%  Weight: 245 lb (111.1 kg)  Height: 6\' 2"  (1.88 m)   Body mass index is 31.46 kg/m.  Gen: resting comfortably, no acute distress HEENT: no scleral icterus, pupils equal round and reactive, no palptable cervical adenopathy,  CV: RRR, no mr/g no jvd Resp: Clear to auscultation bilaterally GI: abdomen is soft, non-tender, non-distended, normal bowel sounds, no hepatosplenomegaly MSK: extremities are warm, no edema.  Skin: warm, no rash Neuro:  no focal deficits Psych: appropriate affect   Diagnostic Studies 06/2017 nuclear stress There was no ST segment deviation noted during stress. Defect 1: There is a medium defect of moderate severity present in the mid inferoseptal and mid inferior location. Findings consistent with prior myocardial infarction with peri-infarct ischemia. Variable soft tissue attenuation artifact is also contributing to defect. This is a low to intermediate risk study. Nuclear stress EF: 46%.   11.2018 echoStudy Conclusions   - Left ventricle: The cavity size was normal. Wall thickness was   increased increased in a pattern of mild to moderate LVH.   Systolic function was mildly reduced. The estimated ejection   fraction was in the range of 45% to 50%. Doppler parameters are   consistent with abnormal left  ventricular relaxation (grade 1   diastolic dysfunction). - Aortic valve: Valve area (VTI): 1.97 cm^2. Valve area (Vmax):   2.04 cm^2. Valve area (Vmean): 2 cm^2. - Technically adequate study.   10/2020 cath Ost 1st Diag lesion is 20% stenosed. Dist RCA lesion is 30% stenosed. Ramus lesion is 25% stenosed. Prox Cx to Mid Cx lesion is 65% stenosed. Mid RCA lesion is 15% stenosed.   Mild to moderate nonobstructive CAD with 20% irregularity in the first diagonal Elizabella Nolet of the LAD, 20% stenosis in the ramus intermediate vessel, smooth 20% proximal circumflex stenosis with no change in the previously noted 65% mid AV groove circumflex stenosis, and mild 15 and 30% smooth narrowings in a dominant RCA.   No significant change since the prior catheterization of 2019.   LVEDP 15 mmHg.   RECOMMENDATION: Guideline directed medical therapy for nonischemic cardiomyopathy with reduced EF at 30 to 35%.  The patient will return to Dr. Harl Bowie.  Smoking cessation is essential.  Aggressive lipid-lowering therapy with target LDL less than 70     10/2020 echo IMPRESSIONS     1. Left ventricular ejection fraction, by estimation, is 30 to 35%. The  left ventricle has moderately decreased function. The left ventricle  demonstrates global hypokinesis. The left ventricular internal cavity size  was mildly dilated. There is moderate   to severe asymmetric left ventricular hypertrophy of the septal segment.  Left ventricular diastolic parameters are consistent with Grade I  diastolic dysfunction (impaired relaxation).   2. Right ventricular systolic function is normal. The right ventricular  size is normal. There is normal pulmonary artery systolic pressure. The  estimated right ventricular systolic pressure is 44.9 mmHg.   3. There is a trivial pericardial effusion anterior to the right  ventricle.   4. The mitral valve is abnormal. Trivial mitral valve regurgitation.   5. The aortic valve is tricuspid.  There is mild calcification of the  aortic valve. Aortic valve regurgitation is not visualized. Mild aortic  valve sclerosis is present, with no evidence of aortic valve stenosis.   6. The inferior vena cava is normal in size with greater than 50%  respiratory variability, suggesting right atrial  pressure of 3 mmHg.       Assessment and Plan  1. Chronic systolic HF - new diagnosis 10/2020 - no recent symptoms - some reported side effects and some initial reluctance to medical therapy initially. He is very willing to retry medications at this time.   - increase coreg to 6.25mg  bid. - reorder bmet, pending renal function titrate ARB and consider ARNI in near future. Optimzie beta and ARB, then consider aldactone and SGLT2i -nursing visit 2 weeks for vitals check and further medication titration - recheck echo over next several months     2. CAD - mild to moderate CAD by recent cath - denies any symptoms      Arnoldo Lenis, M.D.

## 2021-06-06 NOTE — Patient Instructions (Signed)
Medication Instructions:  Increase Coreg to 6.25mg  twice a day Continue all other medications.     Labwork: BMET - order given today.  Office will contact with results via phone or letter.    Testing/Procedures: none  Follow-Up: 2 months   Any Other Special Instructions Will Be Listed Below (If Applicable). Nurse visit in 2 weeks for vitals check.    If you need a refill on your cardiac medications before your next appointment, please call your pharmacy.

## 2021-06-12 DIAGNOSIS — I5022 Chronic systolic (congestive) heart failure: Secondary | ICD-10-CM | POA: Diagnosis not present

## 2021-06-13 LAB — BASIC METABOLIC PANEL
BUN/Creatinine Ratio: 11 (calc) (ref 6–22)
BUN: 20 mg/dL (ref 7–25)
CO2: 29 mmol/L (ref 20–32)
Calcium: 9.1 mg/dL (ref 8.6–10.3)
Chloride: 108 mmol/L (ref 98–110)
Creat: 1.81 mg/dL — ABNORMAL HIGH (ref 0.70–1.28)
Glucose, Bld: 109 mg/dL (ref 65–139)
Potassium: 4 mmol/L (ref 3.5–5.3)
Sodium: 144 mmol/L (ref 135–146)

## 2021-06-14 ENCOUNTER — Telehealth: Payer: Self-pay

## 2021-06-14 DIAGNOSIS — I5022 Chronic systolic (congestive) heart failure: Secondary | ICD-10-CM

## 2021-06-14 NOTE — Telephone Encounter (Signed)
-----   Message from Arnoldo Lenis, MD sent at 06/14/2021 11:28 AM EST ----- Some decrease in renal function in blood work. May be due to the losartan. Any reason he would be dehydrated (diarrhea, vomiting, not eating/drinking well). If no reason for dehydration can he stop losartan and repeat a bmet in 2 weeks  Zandra Abts MD

## 2021-06-14 NOTE — Telephone Encounter (Signed)
Per Zandra Abts, MD anything caffeinated poor for hydration, would drink at least 3-4 bottels of water a day. Have him stop losartan and repeat bmet 2 weeks.     Pt notified and verbalized understanding. Pt will have repeat BMET in two (2) weeks. Pt had no questions or concerned at this time.

## 2021-06-14 NOTE — Telephone Encounter (Signed)
Pt states that he consumes a ton of Mtn Dew daily. Pt advised to cut back/out soda and intake water. Please advise.

## 2021-06-20 ENCOUNTER — Ambulatory Visit (INDEPENDENT_AMBULATORY_CARE_PROVIDER_SITE_OTHER): Payer: Medicare HMO | Admitting: *Deleted

## 2021-06-20 ENCOUNTER — Other Ambulatory Visit: Payer: Self-pay

## 2021-06-20 VITALS — BP 128/82 | HR 75 | Wt 244.2 lb

## 2021-06-20 DIAGNOSIS — I5022 Chronic systolic (congestive) heart failure: Secondary | ICD-10-CM

## 2021-06-20 MED ORDER — NITROGLYCERIN 0.4 MG SL SUBL: 0.4000 mg | SUBLINGUAL_TABLET | SUBLINGUAL | 3 refills | Status: DC | PRN

## 2021-06-20 NOTE — Progress Notes (Signed)
Patient in office for vitals this morning - recent increase to his Coreg.    Nitg refilled today.

## 2021-07-02 NOTE — Progress Notes (Signed)
Increase coreg to 12.5mg  bid, has he gone for his repeat blood work yet to recheck his kidney function?  J Debroh Sieloff MD

## 2021-07-02 NOTE — Progress Notes (Signed)
Left message to return call 

## 2021-07-03 ENCOUNTER — Other Ambulatory Visit (HOSPITAL_COMMUNITY)
Admission: RE | Admit: 2021-07-03 | Discharge: 2021-07-03 | Disposition: A | Discharge status: Home / Self Care | Payer: Medicare HMO | Source: Ambulatory Visit | Attending: Cardiology | Admitting: Cardiology

## 2021-07-03 ENCOUNTER — Other Ambulatory Visit: Payer: Self-pay

## 2021-07-03 DIAGNOSIS — I5022 Chronic systolic (congestive) heart failure: Secondary | ICD-10-CM | POA: Diagnosis not present

## 2021-07-03 LAB — BASIC METABOLIC PANEL
Anion gap: 5 (ref 5–15)
BUN: 21 mg/dL (ref 8–23)
CO2: 28 mmol/L (ref 22–32)
Calcium: 9.3 mg/dL (ref 8.9–10.3)
Chloride: 103 mmol/L (ref 98–111)
Creatinine, Ser: 1.48 mg/dL — ABNORMAL HIGH (ref 0.61–1.24)
GFR, Estimated: 50 mL/min — ABNORMAL LOW (ref >60)
Glucose, Bld: 117 mg/dL — ABNORMAL HIGH (ref 70–99)
Potassium: 4.4 mmol/L (ref 3.5–5.1)
Sodium: 136 mmol/L (ref 135–145)

## 2021-07-04 MED ORDER — CARVEDILOL 12.5 MG PO TABS: 12.5000 mg | ORAL_TABLET | Freq: Two times a day (BID) | ORAL | 6 refills | Status: DC

## 2021-07-04 NOTE — Progress Notes (Signed)
Patient notified and verbalized understanding.  Will send ne prescription to [facility] now.  States he did his labs yesterday.

## 2021-07-18 DIAGNOSIS — G4709 Other insomnia: Secondary | ICD-10-CM | POA: Diagnosis not present

## 2021-07-18 DIAGNOSIS — M1711 Unilateral primary osteoarthritis, right knee: Secondary | ICD-10-CM | POA: Diagnosis not present

## 2021-07-18 DIAGNOSIS — I5022 Chronic systolic (congestive) heart failure: Secondary | ICD-10-CM | POA: Diagnosis not present

## 2021-07-18 DIAGNOSIS — I255 Ischemic cardiomyopathy: Secondary | ICD-10-CM | POA: Diagnosis not present

## 2021-07-23 ENCOUNTER — Encounter: Payer: Self-pay | Admitting: *Deleted

## 2021-08-13 ENCOUNTER — Encounter: Payer: Self-pay | Admitting: Cardiology

## 2021-08-13 ENCOUNTER — Ambulatory Visit: Payer: Medicare HMO | Admitting: Cardiology

## 2021-08-13 VITALS — BP 122/76 | HR 75 | Ht 73.0 in | Wt 240.0 lb

## 2021-08-13 DIAGNOSIS — E782 Mixed hyperlipidemia: Secondary | ICD-10-CM | POA: Diagnosis not present

## 2021-08-13 DIAGNOSIS — I2510 Atherosclerotic heart disease of native coronary artery without angina pectoris: Secondary | ICD-10-CM | POA: Diagnosis not present

## 2021-08-13 DIAGNOSIS — I5022 Chronic systolic (congestive) heart failure: Secondary | ICD-10-CM

## 2021-08-13 DIAGNOSIS — Z79899 Other long term (current) drug therapy: Secondary | ICD-10-CM | POA: Diagnosis not present

## 2021-08-13 MED ORDER — SPIRONOLACTONE 25 MG PO TABS: 12.5000 mg | ORAL_TABLET | Freq: Every day | ORAL | 3 refills | Status: DC

## 2021-08-13 MED ORDER — DAPAGLIFLOZIN PROPANEDIOL 10 MG PO TABS: 10.0000 mg | ORAL_TABLET | Freq: Every day | ORAL | 3 refills | Status: DC

## 2021-08-13 MED ORDER — ATORVASTATIN CALCIUM 40 MG PO TABS: 40.0000 mg | ORAL_TABLET | Freq: Every day | ORAL | 3 refills | Status: DC

## 2021-08-13 NOTE — Patient Instructions (Addendum)
Medication Instructions:  Your physician has recommended you make the following change in your medication:  Start spironolactone 12.5 mg daily in mornings Start farxiga 10 mg daily in mornings Start atorvastatin 40 mg daily at bedtime Continue other medications the same  Labwork: BMET in 2 weeks at [facility] or [facility]  Testing/Procedures: none  Follow-Up: Your physician recommends that you schedule a follow-up appointment in: 2 months  Any Other Special Instructions Will Be Listed Below (If Applicable).  If you need a refill on your cardiac medications before your next appointment, please call your pharmacy.

## 2021-08-13 NOTE — Progress Notes (Signed)
Clinical Summary Craig Mccann is a 71 y.o.male seen today for follow up of the following medical problems.    1. Chest pain/CAD 06/2017 nuclear stress: moderate mid inferoseptal and mid inferior defect with peri-infarct ischemia. LVEF 46% - 06/2017 echo LVEF 27-25%, grade I diastolic dysfunction     10/6642 cath: LAD patent, D1 20%, ramus 25%, LCX 65% prox, RCA mid 15% and distal 30% - no recent chest pains.    -isolated episode of  chest pain over the weekend - pressure left sided, 5/10 in severity. Improved with NG. No recurrence      2. Chronic systolic HF New diagnosis of systolic HF in 0/3474 09/5954 echo LVEF 30-35%, mod to severe asymmetric septal hypertrophy, grade I dd,  10/2020 cath: LAD patent, D1 20%, ramus 25%, LCX 65% prox, RCA mid 15% and distal 30%   - significant elevation in Cr with losartan to 1.8 on 06/12/21 - medication stopped, at recheck Cr back to baseline 1.48   3. Hyperlipidemia 03/2021 TC 202 TG 117 HDL 39 LDL 142 - has not been on statin   Past Medical History:  Diagnosis Date   Acute prostatitis 01/2015   Allergy    Basal cell carcinoma 2017   Multiple: back, left shin, left arm   CAD (coronary artery disease)    a. 07/2017: cath showing 50-60% stenosis along the LCx with a FFR of 0.94 not being hemodynamically significant   CAP (community acquired pneumonia) 06/26/2015   Chronic renal insufficiency, stage II (mild)    stage II/II: CrCl 60s.   Complication of anesthesia    COPD (chronic obstructive pulmonary disease) ([place]) 05/07/15   Long time smoker + COPD changes noted on lung ca screening CT   Diverticulitis    Encounter for screening for lung cancer    Screening CT ok 08/27/16--repeat in 1 yr recommended.   Family history of colon cancer in mother    Hearing impairment    hearing aids   History of adenomatous polyp of colon 12/24/10   History of kidney stones    Hyperlipidemia 07/14/2016   started atorv 20 07/16/16   Kidney stones     bilat nonobstrucing renal calculi 2016, + left benign renal cysts   Melanoma ([place])    R posterolateral neck and R nasal ala.  Most recent was left mid back 01/2016 (Dr. Tarri Glenn).   Spinal headache    Tobacco dependence    CT chest for lung ca screning 04/2015 showed benign findings; 1 yr repeat recommended     Allergies  Allergen Reactions   Codeine Hives   Fentanyl     Other reaction(s): Hypotension   Lexiscan [Regadenoson] Other (See Comments)    Transient Heart Block   Valium [Diazepam] Anaphylaxis     Current Outpatient Medications  Medication Sig Dispense Refill   albuterol (PROAIR HFA) 108 (90 BASE) MCG/ACT inhaler Inhale 2 puffs into the lungs every 6 (six) hours as needed for wheezing or shortness of breath. 1 Inhaler 1   aspirin EC 81 MG tablet Take 1 tablet (81 mg total) by mouth 2 (two) times daily. For DVT prophylaxis for 30 days after surgery. 60 tablet 0   Budeson-Glycopyrrol-Formoterol 160-9-4.8 MCG/ACT AERO Inhale 2 puffs into the lungs 2 (two) times daily.     carvedilol (COREG) 12.5 MG tablet Take 1 tablet (12.5 mg total) by mouth 2 (two) times daily. 60 tablet 6   famotidine (PEPCID) 40 MG tablet Take 40 mg by mouth  2 (two) times daily.     nitroGLYCERIN (NITROSTAT) 0.4 MG SL tablet Place 1 tablet (0.4 mg total) under the tongue every 5 (five) minutes as needed for chest pain. 25 tablet 3   omeprazole (PRILOSEC OTC) 20 MG tablet Take 1 tablet (20 mg total) by mouth daily. For gastric protection 30 tablet 0   No current facility-administered medications for this visit.     Past Surgical History:  Procedure Laterality Date   CERVICAL FUSION     CHOLECYSTECTOMY  1987   COLONOSCOPY  12/24/10; 2014   Multiple TCSs: pt on 5 yr recall, next due 2019.   COLONOSCOPY N/A 10/09/2017   Procedure: COLONOSCOPY;  Surgeon: Danie Binder, MD;  [facility];  Service: Endoscopy;  Laterality: N/A;  1:30   INGUINAL HERNIA REPAIR  1984   INTRAVASCULAR PRESSURE  WIRE/FFR STUDY N/A 07/14/2017   Procedure: INTRAVASCULAR PRESSURE WIRE/FFR STUDY;  Surgeon: Belva Crome, MD;  Location: [facility];  Service: Cardiovascular;  Laterality: N/A;   JOINT REPLACEMENT     left   KNEE SURGERY Left 2015   cartilage repair   KNEE SURGERY Right 1997   "          "   LEFT HEART CATH AND CORONARY ANGIOGRAPHY N/A 07/14/2017   Procedure: LEFT HEART CATH AND CORONARY ANGIOGRAPHY;  Surgeon: Belva Crome, MD;  Location: [facility];  Service: Cardiovascular;  Laterality: N/A;   LEFT HEART CATH AND CORONARY ANGIOGRAPHY N/A 10/25/2020   Procedure: LEFT HEART CATH AND CORONARY ANGIOGRAPHY;  Surgeon: Troy Sine, MD;  Location: [facility];  Service: Cardiovascular;  Laterality: N/A;   LITHOTRIPSY     NECK SURGERY  1989   Thumb surgery Bilateral 2005   and 2006   TOTAL HIP ARTHROPLASTY Left 11/20/2020   Procedure: TOTAL HIP ARTHROPLASTY ANTERIOR APPROACH;  Surgeon: Renette Butters, MD;  [facility];  Service: Orthopedics;  Laterality: Left;   TOTAL KNEE ARTHROPLASTY Left 04/23/2016   Procedure: TOTAL KNEE ARTHROPLASTY;  Surgeon: Ninetta Lights, MD;  Location: [facility];  Service: Orthopedics;  Laterality: Left;     Allergies  Allergen Reactions   Codeine Hives   Fentanyl     Other reaction(s): Hypotension   Lexiscan [Regadenoson] Other (See Comments)    Transient Heart Block   Valium [Diazepam] Anaphylaxis      Family History  Problem Relation Age of Onset   Colon cancer Mother    Cancer Mother    Lung cancer Father    Cancer Father    Brain cancer Brother    Cancer Brother      Social History Craig Mccann reports that he has been smoking cigarettes. He started smoking about 53 years ago. He has a 20.00 pack-year smoking history. He has never used smokeless tobacco. Craig Mccann reports no history of alcohol use.   Review of Systems CONSTITUTIONAL: No weight loss, fever, chills, weakness or fatigue.  HEENT: Eyes: No  visual loss, blurred vision, double vision or yellow sclerae.No hearing loss, sneezing, congestion, runny nose or sore throat.  SKIN: No rash or itching.  CARDIOVASCULAR: per hpi RESPIRATORY: No shortness of breath, cough or sputum.  GASTROINTESTINAL: No anorexia, nausea, vomiting or diarrhea. No abdominal pain or blood.  GENITOURINARY: No burning on urination, no polyuria NEUROLOGICAL: No headache, dizziness, syncope, paralysis, ataxia, numbness or tingling in the extremities. No change in bowel or bladder control.  MUSCULOSKELETAL: No muscle, back pain,  joint pain or stiffness.  LYMPHATICS: No enlarged nodes. No history of splenectomy.  PSYCHIATRIC: No history of depression or anxiety.  ENDOCRINOLOGIC: No reports of sweating, cold or heat intolerance. No polyuria or polydipsia.  [name]   Physical Examination Today's Vitals   08/13/21 1343  BP: 122/76  Pulse: 75  SpO2: 95%  Weight: 240 lb (108.9 kg)  Height: 6\' 1"  (1.854 m)   Body mass index is 31.66 kg/m.  Gen: resting comfortably, no acute distress HEENT: no scleral icterus, pupils equal round and reactive, no palptable cervical adenopathy,  CV: RRR, no m/r/ gno jvd Resp: Clear to auscultation bilaterally GI: abdomen is soft, non-tender, non-distended, normal bowel sounds, no hepatosplenomegaly MSK: extremities are warm, no edema.  Skin: warm, no rash Neuro:  no focal deficits Psych: appropriate affect   Diagnostic Studies  06/2017 nuclear stress There was no ST segment deviation noted during stress. Defect 1: There is a medium defect of moderate severity present in the mid inferoseptal and mid inferior location. Findings consistent with prior myocardial infarction with peri-infarct ischemia. Variable soft tissue attenuation artifact is also contributing to defect. This is a low to intermediate risk study. Nuclear stress EF: 46%.   11.2018 echoStudy Conclusions   - Left ventricle: The cavity size was normal. Wall  thickness was   increased increased in a pattern of mild to moderate LVH.   Systolic function was mildly reduced. The estimated ejection   fraction was in the range of 45% to 50%. Doppler parameters are   consistent with abnormal left ventricular relaxation (grade 1   diastolic dysfunction). - Aortic valve: Valve area (VTI): 1.97 cm^2. Valve area (Vmax):   2.04 cm^2. Valve area (Vmean): 2 cm^2. - Technically adequate study.   10/2020 cath Ost 1st Diag lesion is 20% stenosed. Dist RCA lesion is 30% stenosed. Ramus lesion is 25% stenosed. Prox Cx to Mid Cx lesion is 65% stenosed. Mid RCA lesion is 15% stenosed.   Mild to moderate nonobstructive CAD with 20% irregularity in the first diagonal Leathia Farnell of the LAD, 20% stenosis in the ramus intermediate vessel, smooth 20% proximal circumflex stenosis with no change in the previously noted 65% mid AV groove circumflex stenosis, and mild 15 and 30% smooth narrowings in a dominant RCA.   No significant change since the prior catheterization of 2019.   LVEDP 15 mmHg.   RECOMMENDATION: Guideline directed medical therapy for nonischemic cardiomyopathy with reduced EF at 30 to 35%.  The patient will return to Dr. Harl Bowie.  Smoking cessation is essential.  Aggressive lipid-lowering therapy with target LDL less than 70     10/2020 echo IMPRESSIONS     1. Left ventricular ejection fraction, by estimation, is 30 to 35%. The  left ventricle has moderately decreased function. The left ventricle  demonstrates global hypokinesis. The left ventricular internal cavity size  was mildly dilated. There is moderate   to severe asymmetric left ventricular hypertrophy of the septal segment.  Left ventricular diastolic parameters are consistent with Grade I  diastolic dysfunction (impaired relaxation).   2. Right ventricular systolic function is normal. The right ventricular  size is normal. There is normal pulmonary artery systolic pressure. The  estimated  right ventricular systolic pressure is 65.7 mmHg.   3. There is a trivial pericardial effusion anterior to the right  ventricle.   4. The mitral valve is abnormal. Trivial mitral valve regurgitation.   5. The aortic valve is tricuspid. There is mild calcification of the  aortic valve.  Aortic valve regurgitation is not visualized. Mild aortic  valve sclerosis is present, with no evidence of aortic valve stenosis.   6. The inferior vena cava is normal in size with greater than 50%  respiratory variability, suggesting right atrial pressure of 3 mmHg.        Assessment and Plan   1. Chronic systolic HF - new diagnosis 10/2020 - denies any recent symptoms - significant elevation of Cr with just low dose losartan, discontinued. Would avoid ACE/ARB/ARNI - start aldactone 12.5mg  daily, check bmet 2 weeks - start farxiga 10mg  daily.  - recheck echo over next several months     2. CAD - mild to moderate CAD by recent cath - isolated episode of chest pain over the weekend. Cath last year moderate disease - continue to monitor at this time  3. Hyperlipidemia - should be on statin, start atorvastatin 40mg  daily.    F/u 2 months  Arnoldo Lenis, M.D.

## 2021-08-27 ENCOUNTER — Other Ambulatory Visit: Payer: Self-pay | Admitting: Cardiology

## 2021-08-27 DIAGNOSIS — I5022 Chronic systolic (congestive) heart failure: Secondary | ICD-10-CM | POA: Diagnosis not present

## 2021-08-27 DIAGNOSIS — Z79899 Other long term (current) drug therapy: Secondary | ICD-10-CM | POA: Diagnosis not present

## 2021-08-27 LAB — BASIC METABOLIC PANEL WITH GFR
BUN/Creatinine Ratio: 13 (calc) (ref 6–22)
BUN: 21 mg/dL (ref 7–25)
CO2: 32 mmol/L (ref 20–32)
Calcium: 9.9 mg/dL (ref 8.6–10.3)
Chloride: 104 mmol/L (ref 98–110)
Creat: 1.67 mg/dL — ABNORMAL HIGH (ref 0.70–1.28)
Glucose, Bld: 96 mg/dL (ref 65–99)
Potassium: 4.3 mmol/L (ref 3.5–5.3)
Sodium: 139 mmol/L (ref 135–146)
eGFR: 43 mL/min/{1.73_m2} — ABNORMAL LOW (ref >=60)

## 2021-08-28 ENCOUNTER — Telehealth: Payer: Self-pay | Admitting: *Deleted

## 2021-08-28 DIAGNOSIS — Z79899 Other long term (current) drug therapy: Secondary | ICD-10-CM

## 2021-08-28 DIAGNOSIS — I5022 Chronic systolic (congestive) heart failure: Secondary | ICD-10-CM

## 2021-08-28 NOTE — Telephone Encounter (Signed)
-----   Message from Arnoldo Lenis, MD sent at 08/28/2021  2:43 PM EST ----- Labs overall look fine. Has some decreased renal function similar to prior labs, would repeat bmet in 2 weeks to verify not changing any further  Zandra Abts MD

## 2021-08-28 NOTE — Telephone Encounter (Signed)
Patient informed and verbalized understanding of plan. Copy sent to PCP Lab order faxed to [facility]

## 2021-09-11 DIAGNOSIS — Z79899 Other long term (current) drug therapy: Secondary | ICD-10-CM | POA: Diagnosis not present

## 2021-09-11 DIAGNOSIS — I5022 Chronic systolic (congestive) heart failure: Secondary | ICD-10-CM | POA: Diagnosis not present

## 2021-09-12 LAB — BASIC METABOLIC PANEL
BUN/Creatinine Ratio: 16 (calc) (ref 6–22)
BUN: 24 mg/dL (ref 7–25)
CO2: 28 mmol/L (ref 20–32)
Calcium: 9.2 mg/dL (ref 8.6–10.3)
Chloride: 105 mmol/L (ref 98–110)
Creat: 1.54 mg/dL — ABNORMAL HIGH (ref 0.70–1.28)
Glucose, Bld: 111 mg/dL — ABNORMAL HIGH (ref 65–99)
Potassium: 4.4 mmol/L (ref 3.5–5.3)
Sodium: 139 mmol/L (ref 135–146)

## 2021-09-17 ENCOUNTER — Telehealth: Payer: Self-pay | Admitting: *Deleted

## 2021-09-17 NOTE — Telephone Encounter (Signed)
Laurine Blazer, LPN  3/68/5992 34:14 AM EST Back to Top    Notified, copy to pcp.

## 2021-09-17 NOTE — Telephone Encounter (Signed)
-----   Message from Merlene Laughter, RN sent at 09/17/2021  9:41 AM EST -----  ----- Message ----- From: Arnoldo Lenis, MD Sent: 09/15/2021  11:00 AM EST To: Merlene Laughter, RN  Kidney function remains decreased but improved from last check and in a similar range to prior tests  Zandra Abts MD

## 2021-09-25 DIAGNOSIS — J069 Acute upper respiratory infection, unspecified: Secondary | ICD-10-CM | POA: Diagnosis not present

## 2021-09-25 DIAGNOSIS — Z20822 Contact with and (suspected) exposure to covid-19: Secondary | ICD-10-CM | POA: Diagnosis not present

## 2021-09-25 DIAGNOSIS — J209 Acute bronchitis, unspecified: Secondary | ICD-10-CM | POA: Diagnosis not present

## 2021-09-25 DIAGNOSIS — M791 Myalgia, unspecified site: Secondary | ICD-10-CM | POA: Diagnosis not present

## 2021-10-24 ENCOUNTER — Ambulatory Visit: Payer: Medicare HMO | Admitting: Cardiology

## 2021-10-24 ENCOUNTER — Encounter: Payer: Self-pay | Admitting: Cardiology

## 2021-10-24 VITALS — BP 90/60 | HR 76 | Ht 73.0 in | Wt 237.6 lb

## 2021-10-24 DIAGNOSIS — E782 Mixed hyperlipidemia: Secondary | ICD-10-CM | POA: Diagnosis not present

## 2021-10-24 DIAGNOSIS — I5022 Chronic systolic (congestive) heart failure: Secondary | ICD-10-CM | POA: Diagnosis not present

## 2021-10-24 DIAGNOSIS — I25118 Atherosclerotic heart disease of native coronary artery with other forms of angina pectoris: Secondary | ICD-10-CM

## 2021-10-24 MED ORDER — ISOSORBIDE MONONITRATE ER 30 MG PO TB24: 15.0000 mg | ORAL_TABLET | Freq: Every day | ORAL | 6 refills | Status: DC

## 2021-10-24 MED ORDER — ROSUVASTATIN CALCIUM 5 MG PO TABS: 5.0000 mg | ORAL_TABLET | Freq: Every day | ORAL | 6 refills | Status: DC

## 2021-10-24 NOTE — Patient Instructions (Signed)
Medication Instructions:  ?Begin Imdur '15mg'$  daily ?Stop Losartan (Cozaar) ?Stop Atorvastatin (Lipitor) ?Begin Crestor '5mg'$  daily ?Continue all other medications.    ? ?Labwork: ?none ? ?Testing/Procedures: ?Your physician has requested that you have an echocardiogram. Echocardiography is a painless test that uses sound waves to create images of your heart. It provides your doctor with information about the size and shape of your heart and how well your heart?s chambers and valves are working. This procedure takes approximately one hour. There are no restrictions for this procedure. ?Office will contact with results via phone or letter.    ? ?Follow-Up: ?3 months  ? ?Any Other Special Instructions Will Be Listed Below (If Applicable). ? ? ?If you need a refill on your cardiac medications before your next appointment, please call your pharmacy. ? ?

## 2021-10-24 NOTE — Progress Notes (Signed)
? ? ? ?Clinical Summary ?[name] is a 71 y.o.maleseen today for follow up of the following medical problems.  ?  ?1. Chest pain/CAD ?06/2017 nuclear stress: moderate mid inferoseptal and mid inferior defect with peri-infarct ischemia. LVEF 46% ?- 06/2017 echo LVEF 55-73%, grade I diastolic dysfunction ?  ?  ?10/2020 cath: LAD patent, D1 20%, ramus 25%, LCX 65% prox, RCA mid 15% and distal 30% ?- chronic episodes of chest pains roughly 3 a month, improve with with NG ?  ?  ?  ?  ?2. Chronic systolic HF ?New diagnosis of systolic HF in 09/2023 ?11/2704 echo LVEF 30-35%, mod to severe asymmetric septal hypertrophy, grade I dd,  ?10/2020 cath: LAD patent, D1 20%, ramus 25%, LCX 65% prox, RCA mid 15% and distal 30%  ?  ?- significant elevation in Cr with losartan to 1.8 on 06/12/21 ?- medication stopped, at recheck Cr back to baseline 1.48 ?  ? ?-last visit started aldactone 12.'5mg'$  daily, farxiga '10mg'$  daiy. ?- repeat labs Cr 1.54, K 4.4 ?-no recent symptoms.  ?-appears he accidentally restarted his losartan ? ?  ?3. Hyperlipidemia ?03/2021 TC 202 TG 117 HDL 39 LDL 142 ?- reports joint pains, he stopped taking atorvastatin about 1 month and symptoms improved.  ? ?Past Medical History:  ?Diagnosis Date  ? Acute prostatitis 01/2015  ? Allergy   ? Basal cell carcinoma 2017  ? Multiple: back, left shin, left arm  ? CAD (coronary artery disease)   ? a. 07/2017: cath showing 50-60% stenosis along the LCx with a FFR of 0.94 not being hemodynamically significant  ? CAP (community acquired pneumonia) 06/26/2015  ? Chronic renal insufficiency, stage II (mild)   ? stage II/II: CrCl 60s.  ? Complication of anesthesia   ? COPD (chronic obstructive pulmonary disease) ([place]) 05/07/15  ? Long time smoker + COPD changes noted on lung ca screening CT  ? Diverticulitis   ? Encounter for screening for lung cancer   ? Screening CT ok 08/27/16--repeat in 1 yr recommended.  ? Family history of colon cancer in mother   ? Hearing impairment   ?  hearing aids  ? History of adenomatous polyp of colon 12/24/10  ? History of kidney stones   ? Hyperlipidemia 07/14/2016  ? started atorv 20 07/16/16  ? Kidney stones   ? bilat nonobstrucing renal calculi 2016, + left benign renal cysts  ? Melanoma ([place])   ? R posterolateral neck and R nasal ala.  Most recent was left mid back 01/2016 (Dr. Tarri Glenn).  ? Spinal headache   ? Tobacco dependence   ? CT chest for lung ca screning 04/2015 showed benign findings; 1 yr repeat recommended  ? ? ? ?Allergies  ?Allergen Reactions  ? Codeine Hives  ? Fentanyl   ?  Other reaction(s): Hypotension  ? Lexiscan [Regadenoson] Other (See Comments)  ?  Transient Heart Block  ? Valium [Diazepam] Anaphylaxis  ? ? ? ?Current Outpatient Medications  ?Medication Sig Dispense Refill  ? albuterol (PROAIR HFA) 108 (90 BASE) MCG/ACT inhaler Inhale 2 puffs into the lungs every 6 (six) hours as needed for wheezing or shortness of breath. 1 Inhaler 1  ? aspirin EC 81 MG tablet Take 1 tablet (81 mg total) by mouth 2 (two) times daily. For DVT prophylaxis for 30 days after surgery. 60 tablet 0  ? atorvastatin (LIPITOR) 40 MG tablet Take 1 tablet (40 mg total) by mouth daily. 30 tablet 3  ? Budeson-Glycopyrrol-Formoterol 160-9-4.8 MCG/ACT AERO Inhale  2 puffs into the lungs 2 (two) times daily.    ? carvedilol (COREG) 12.5 MG tablet Take 1 tablet (12.5 mg total) by mouth 2 (two) times daily. 60 tablet 6  ? dapagliflozin propanediol (FARXIGA) 10 MG TABS tablet Take 1 tablet (10 mg total) by mouth daily before breakfast. 30 tablet 3  ? famotidine (PEPCID) 40 MG tablet Take 40 mg by mouth 2 (two) times daily.    ? nitroGLYCERIN (NITROSTAT) 0.4 MG SL tablet Place 1 tablet (0.4 mg total) under the tongue every 5 (five) minutes as needed for chest pain. 25 tablet 3  ? omeprazole (PRILOSEC OTC) 20 MG tablet Take 1 tablet (20 mg total) by mouth daily. For gastric protection (Patient not taking: Reported on 08/13/2021) 30 tablet 0  ? spironolactone (ALDACTONE) 25 MG  tablet Take 0.5 tablets (12.5 mg total) by mouth daily. 15 tablet 3  ? ?No current facility-administered medications for this visit.  ? ? ? ?Past Surgical History:  ?Procedure Laterality Date  ? CERVICAL FUSION    ? CHOLECYSTECTOMY  1987  ? COLONOSCOPY  12/24/10; 2014  ? Multiple TCSs: pt on 5 yr recall, next due 2019.  ? COLONOSCOPY N/A 10/09/2017  ? Procedure: COLONOSCOPY;  Surgeon: Danie Binder, MD;  [facility];  Service: Endoscopy;  Laterality: N/A;  1:30  ? [place]  ? INTRAVASCULAR PRESSURE WIRE/FFR STUDY N/A 07/14/2017  ? Procedure: INTRAVASCULAR PRESSURE WIRE/FFR STUDY;  Surgeon: Belva Crome, MD;  Location: [facility];  Service: Cardiovascular;  Laterality: N/A;  ? JOINT REPLACEMENT    ? left  ? KNEE SURGERY Left 2015  ? cartilage repair  ? KNEE SURGERY Right 1997  ? "          "  ? LEFT HEART CATH AND CORONARY ANGIOGRAPHY N/A 07/14/2017  ? Procedure: LEFT HEART CATH AND CORONARY ANGIOGRAPHY;  Surgeon: Belva Crome, MD;  Location: [facility];  Service: Cardiovascular;  Laterality: N/A;  ? LEFT HEART CATH AND CORONARY ANGIOGRAPHY N/A 10/25/2020  ? Procedure: LEFT HEART CATH AND CORONARY ANGIOGRAPHY;  Surgeon: Troy Sine, MD;  Location: [facility];  Service: Cardiovascular;  Laterality: N/A;  ? LITHOTRIPSY    ? NECK SURGERY  1989  ? Thumb surgery Bilateral 2005  ? and 2006  ? TOTAL HIP ARTHROPLASTY Left 11/20/2020  ? Procedure: TOTAL HIP ARTHROPLASTY ANTERIOR APPROACH;  Surgeon: Renette Butters, MD;  Location: [facility];  Service: Orthopedics;  Laterality: Left;  ? TOTAL KNEE ARTHROPLASTY Left 04/23/2016  ? Procedure: TOTAL KNEE ARTHROPLASTY;  Surgeon: Ninetta Lights, MD;  Location: [facility];  Service: Orthopedics;  Laterality: Left;  ? ? ? ?Allergies  ?Allergen Reactions  ? Codeine Hives  ? Fentanyl   ?  Other reaction(s): Hypotension  ? Lexiscan [Regadenoson] Other (See Comments)  ?  Transient Heart Block  ? Valium [Diazepam] Anaphylaxis   ? ? ? ? ?Family History  ?Problem Relation Age of Onset  ? Colon cancer Mother   ? Cancer Mother   ? Lung cancer Father   ? Cancer Father   ? Brain cancer Brother   ? Cancer Brother   ? ? ? ?Social History ?Mr. Frisbee reports that he has been smoking cigarettes. He started smoking about 53 years ago. He has a 20.00 pack-year smoking history. He has never used smokeless tobacco. ?Mr. Dumond reports no history of alcohol use. ? ? ?Review of Systems ?CONSTITUTIONAL: No weight  loss, fever, chills, weakness or fatigue.  ?HEENT: Eyes: No visual loss, blurred vision, double vision or yellow sclerae.No hearing loss, sneezing, congestion, runny nose or sore throat.  ?SKIN: No rash or itching.  ?CARDIOVASCULAR: per hpi ?RESPIRATORY: No shortness of breath, cough or sputum.  ?GASTROINTESTINAL: No anorexia, nausea, vomiting or diarrhea. No abdominal pain or blood.  ?GENITOURINARY: No burning on urination, no polyuria ?NEUROLOGICAL: No headache, dizziness, syncope, paralysis, ataxia, numbness or tingling in the extremities. No change in bowel or bladder control.  ?MUSCULOSKELETAL: No muscle, back pain, joint pain or stiffness.  ?LYMPHATICS: No enlarged nodes. No history of splenectomy.  ?PSYCHIATRIC: No history of depression or anxiety.  ?ENDOCRINOLOGIC: No reports of sweating, cold or heat intolerance. No polyuria or polydipsia.  ?. ? ? ?Physical Examination ?Today's Vitals  ? 10/24/21 1310  ?BP: 90/60  ?Pulse: 76  ?SpO2: 98%  ?Weight: 237 lb 9.6 oz (107.8 kg)  ?Height: '6\' 1"'$  (1.854 m)  ? ?Body mass index is 31.35 kg/m?. ? ?Gen: resting comfortably, no acute distress ?HEENT: no scleral icterus, pupils equal round and reactive, no palptable cervical adenopathy,  ?CV: RRR, no m/r/ gno jvd ?Resp: Clear to auscultation bilaterally ?GI: abdomen is soft, non-tender, non-distended, normal bowel sounds, no hepatosplenomegaly ?MSK: extremities are warm, no edema.  ?Skin: warm, no rash ?Neuro:  no focal deficits ?Psych: appropriate  affect ? ? ?Diagnostic Studies ? ?06/2017 nuclear stress ?There was no ST segment deviation noted during stress. ?Defect 1: There is a medium defect of moderate severity present in the mid inferoseptal and mid inferio

## 2021-10-30 ENCOUNTER — Ambulatory Visit (INDEPENDENT_AMBULATORY_CARE_PROVIDER_SITE_OTHER): Payer: Medicare HMO

## 2021-10-30 DIAGNOSIS — I5022 Chronic systolic (congestive) heart failure: Secondary | ICD-10-CM

## 2021-10-31 ENCOUNTER — Telehealth: Payer: Self-pay | Admitting: Cardiology

## 2021-10-31 NOTE — Telephone Encounter (Signed)
Pt c/o medication issue: ? ?1. Name of Medication: Nitroglycerin ? ?2. How are you currently taking this medication (dosage and times per day)?  I/2 a tablet a day ? ?3. Are you having a reaction (difficulty breathing--STAT)?  ? ?4. What is your medication issue? If patient have  an episode after he already have taken his Nitroglycerin, does he take another one?  episode? ? ?

## 2021-10-31 NOTE — Telephone Encounter (Signed)
Contacted and gave clear instructions for nitroglycerin use. Dissolve one under tongue for chest pain every 5 minutes up to 3 doses. If no relief, proceed to ED. ?Verbalized understanding. ?

## 2021-11-05 LAB — ECHOCARDIOGRAM COMPLETE
AR max vel: 1.8 cm2
AV Area VTI: 1.85 cm2
AV Area mean vel: 2.01 cm2
AV Mean grad: 7 mmHg
AV Peak grad: 14.8 mmHg
Ao pk vel: 1.92 m/s
Area-P 1/2: 3.6 cm2
Calc EF: 34.6 %
S' Lateral: 4.96 cm
Single Plane A2C EF: 31.8 %
Single Plane A4C EF: 39.5 %

## 2021-11-15 ENCOUNTER — Telehealth: Payer: Self-pay | Admitting: *Deleted

## 2021-11-15 DIAGNOSIS — R931 Abnormal findings on diagnostic imaging of heart and coronary circulation: Secondary | ICD-10-CM

## 2021-11-15 NOTE — Telephone Encounter (Signed)
Laurine Blazer, LPN  ?09/15/9415 40:81 AM EDT Back to Top  ?  ?Notified, copy to pcp.  He agrees to referral.    ? ?

## 2021-11-15 NOTE — Telephone Encounter (Signed)
-----   Message from Merlene Laughter, RN sent at 11/13/2021  4:23 PM EDT ----- ? ?----- Message ----- ?From: Arnoldo Lenis, MD ?Sent: 11/13/2021   4:13 PM EDT ?To: Merlene Laughter, RN ? ?Heart pumping functin remains decreased at 30-35% (normal is 50-60%). Can we refer him to Dr Lovena Le to discuss possible ICD ? ?Zandra Abts MD ? ?

## 2021-11-26 ENCOUNTER — Other Ambulatory Visit: Payer: Medicare HMO

## 2021-11-27 ENCOUNTER — Other Ambulatory Visit: Payer: Self-pay | Admitting: Cardiology

## 2021-12-09 ENCOUNTER — Ambulatory Visit (INDEPENDENT_AMBULATORY_CARE_PROVIDER_SITE_OTHER): Payer: Medicare HMO | Admitting: Internal Medicine

## 2021-12-09 ENCOUNTER — Encounter: Payer: Self-pay | Admitting: Internal Medicine

## 2021-12-09 DIAGNOSIS — I5022 Chronic systolic (congestive) heart failure: Secondary | ICD-10-CM | POA: Diagnosis not present

## 2021-12-09 DIAGNOSIS — I200 Unstable angina: Secondary | ICD-10-CM | POA: Diagnosis not present

## 2021-12-09 LAB — CBC WITH DIFFERENTIAL/PLATELET
Basophils Absolute: 0 10*3/uL (ref 0.0–0.2)
Basos: 0 %
EOS (ABSOLUTE): 0.1 10*3/uL (ref 0.0–0.4)
Eos: 2 %
Hematocrit: 43.4 % (ref 37.5–51.0)
Hemoglobin: 14.6 g/dL (ref 13.0–17.7)
Lymphocytes Absolute: 1.6 10*3/uL (ref 0.7–3.1)
Lymphs: 26 %
MCH: 30.7 pg (ref 26.6–33.0)
MCHC: 33.6 g/dL (ref 31.5–35.7)
MCV: 91 fL (ref 79–97)
Monocytes Absolute: 0.5 10*3/uL (ref 0.1–0.9)
Monocytes: 8 %
Neutrophils Absolute: 3.9 10*3/uL (ref 1.4–7.0)
Neutrophils: 64 %
Platelets: 252 10*3/uL (ref 150–450)
RBC: 4.75 x10E6/uL (ref 4.14–5.80)
RDW: 14 % (ref 11.6–15.4)
WBC: 6.2 10*3/uL (ref 3.4–10.8)

## 2021-12-09 LAB — BASIC METABOLIC PANEL
BUN/Creatinine Ratio: 15 (ref 10–24)
BUN: 26 mg/dL (ref 8–27)
CO2: 26 mmol/L (ref 20–29)
Calcium: 9.5 mg/dL (ref 8.6–10.2)
Chloride: 105 mmol/L (ref 96–106)
Creatinine, Ser: 1.68 mg/dL — ABNORMAL HIGH (ref 0.76–1.27)
Glucose: 96 mg/dL (ref 70–99)
Potassium: 4.7 mmol/L (ref 3.5–5.2)
Sodium: 138 mmol/L (ref 134–144)
eGFR: 43 mL/min/{1.73_m2} — ABNORMAL LOW (ref >59)

## 2021-12-09 MED ORDER — CARVEDILOL 12.5 MG PO TABS: 12.5000 mg | ORAL_TABLET | Freq: Two times a day (BID) | ORAL | 3 refills | Status: DC

## 2021-12-09 NOTE — H&P (View-Only) (Signed)
? ? ? ? ?HPI ?Craig Mccann is referred by Dr. Harl Bowie for consideration for ICD insertion for primary prevention of sudden death. He is a pleasant 71 yo man with CAD but a non-ischemic CM. His EF despite GDMT is 30%. He has had a h/o Non-obstructive CAD with a cath over a year ago demonstrating a 65% circ. He notes that over the last month he has had worsening symptoms of angina. He has mostly crescendo symptoms but has had occaisional symptoms at rest. He denies syncope.  ?Allergies  ?Allergen Reactions  ? Codeine Hives  ? Fentanyl   ?  Other reaction(s): Hypotension  ? Lexiscan [Regadenoson] Other (See Comments)  ?  Transient Heart Block  ? Valium [Diazepam] Anaphylaxis  ? ? ? ?Current Outpatient Medications  ?Medication Sig Dispense Refill  ? albuterol (PROAIR HFA) 108 (90 BASE) MCG/ACT inhaler Inhale 2 puffs into the lungs every 6 (six) hours as needed for wheezing or shortness of breath. 1 Inhaler 1  ? aspirin EC 81 MG tablet Take 1 tablet (81 mg total) by mouth 2 (two) times daily. For DVT prophylaxis for 30 days after surgery. 60 tablet 0  ? Budeson-Glycopyrrol-Formoterol 160-9-4.8 MCG/ACT AERO Inhale 2 puffs into the lungs 2 (two) times daily.    ? carvedilol (COREG) 12.5 MG tablet Take 1 tablet (12.5 mg total) by mouth 2 (two) times daily. 60 tablet 6  ? FARXIGA 10 MG TABS tablet TAKE 1 TABLET BY MOUTH DAILY BEFORE BREAKFAST 30 tablet 3  ? isosorbide mononitrate (IMDUR) 30 MG 24 hr tablet Take 0.5 tablets (15 mg total) by mouth daily. 15 tablet 6  ? nitroGLYCERIN (NITROSTAT) 0.4 MG SL tablet Place 1 tablet (0.4 mg total) under the tongue every 5 (five) minutes as needed for chest pain. 25 tablet 3  ? spironolactone (ALDACTONE) 25 MG tablet TAKE 1/2 TABLET BY MOUTH DAILY 15 tablet 3  ? traZODone (DESYREL) 50 MG tablet Take 50 mg by mouth at bedtime.    ? famotidine (PEPCID) 40 MG tablet Take 40 mg by mouth 2 (two) times daily. (Patient not taking: Reported on 12/09/2021)    ? omeprazole (PRILOSEC OTC) 20 MG  tablet Take 1 tablet (20 mg total) by mouth daily. For gastric protection (Patient not taking: Reported on 12/09/2021) 30 tablet 0  ? rosuvastatin (CRESTOR) 5 MG tablet Take 1 tablet (5 mg total) by mouth daily. (Patient not taking: Reported on 12/09/2021) 30 tablet 6  ? ?No current facility-administered medications for this visit.  ? ? ? ?Past Medical History:  ?Diagnosis Date  ? Acute prostatitis 01/2015  ? Allergy   ? Basal cell carcinoma 2017  ? Multiple: back, left shin, left arm  ? CAD (coronary artery disease)   ? a. 07/2017: cath showing 50-60% stenosis along the LCx with a FFR of 0.94 not being hemodynamically significant  ? CAP (community acquired pneumonia) 06/26/2015  ? Chronic renal insufficiency, stage II (mild)   ? stage II/II: CrCl 60s.  ? Complication of anesthesia   ? COPD (chronic obstructive pulmonary disease) ([place]) 05/07/15  ? Long time smoker + COPD changes noted on lung ca screening CT  ? Diverticulitis   ? Encounter for screening for lung cancer   ? Screening CT ok 08/27/16--repeat in 1 yr recommended.  ? Family history of colon cancer in mother   ? Hearing impairment   ? hearing aids  ? History of adenomatous polyp of colon 12/24/10  ? History of kidney stones   ? Hyperlipidemia  07/14/2016  ? started atorv 20 07/16/16  ? Kidney stones   ? bilat nonobstrucing renal calculi 2016, + left benign renal cysts  ? Melanoma ([place])   ? R posterolateral neck and R nasal ala.  Most recent was left mid back 01/2016 (Dr. Tarri Glenn).  ? Spinal headache   ? Tobacco dependence   ? CT chest for lung ca screning 04/2015 showed benign findings; 1 yr repeat recommended  ? ? ?ROS: ? ? All systems reviewed and negative except as noted in the HPI. ? ? ?Past Surgical History:  ?Procedure Laterality Date  ? CERVICAL FUSION    ? CHOLECYSTECTOMY  1987  ? COLONOSCOPY  12/24/10; 2014  ? Multiple TCSs: pt on 5 yr recall, next due 2019.  ? COLONOSCOPY N/A 10/09/2017  ? Procedure: COLONOSCOPY;  Surgeon: Danie Binder, MD;  [facility];  Service: Endoscopy;  Laterality: N/A;  1:30  ? [place]  ? INTRAVASCULAR PRESSURE WIRE/FFR STUDY N/A 07/14/2017  ? Procedure: INTRAVASCULAR PRESSURE WIRE/FFR STUDY;  Surgeon: Belva Crome, MD;  Location: [facility];  Service: Cardiovascular;  Laterality: N/A;  ? JOINT REPLACEMENT    ? left  ? KNEE SURGERY Left 2015  ? cartilage repair  ? KNEE SURGERY Right 1997  ? "          "  ? LEFT HEART CATH AND CORONARY ANGIOGRAPHY N/A 07/14/2017  ? Procedure: LEFT HEART CATH AND CORONARY ANGIOGRAPHY;  Surgeon: Belva Crome, MD;  Location: [facility];  Service: Cardiovascular;  Laterality: N/A;  ? LEFT HEART CATH AND CORONARY ANGIOGRAPHY N/A 10/25/2020  ? Procedure: LEFT HEART CATH AND CORONARY ANGIOGRAPHY;  Surgeon: Troy Sine, MD;  Location: [facility];  Service: Cardiovascular;  Laterality: N/A;  ? LITHOTRIPSY    ? NECK SURGERY  1989  ? Thumb surgery Bilateral 2005  ? and 2006  ? TOTAL HIP ARTHROPLASTY Left 11/20/2020  ? Procedure: TOTAL HIP ARTHROPLASTY ANTERIOR APPROACH;  Surgeon: Renette Butters, MD;  [facility];  Service: Orthopedics;  Laterality: Left;  ? TOTAL KNEE ARTHROPLASTY Left 04/23/2016  ? Procedure: TOTAL KNEE ARTHROPLASTY;  Surgeon: Ninetta Lights, MD;  Location: [facility];  Service: Orthopedics;  Laterality: Left;  ? ? ? ?Family History  ?Problem Relation Age of Onset  ? Colon cancer Mother   ? Cancer Mother   ? Lung cancer Father   ? Cancer Father   ? Brain cancer Brother   ? Cancer Brother   ? ? ? ?Social History  ? ?Socioeconomic History  ? Marital status: Married  ?  Spouse name: Not on file  ? Number of children: Not on file  ? Years of education: Not on file  ? Highest education level: Not on file  ?Occupational History  ? Not on file  ?Tobacco Use  ? Smoking status: Some Days  ?  Packs/day: 0.50  ?  Years: 40.00  ?  Pack years: 20.00  ?  Types: Cigarettes  ?  Start date: 08/04/1968  ? Smokeless tobacco: Never  ? Tobacco comments:  ?   half pack a day  ?Vaping Use  ? Vaping Use: Never used  ?Substance and Sexual Activity  ? Alcohol use: No  ?  Alcohol/week: 0.0 standard drinks  ? Drug use: No  ? Sexual activity: Yes  ?Other Topics Concern  ? Not on file  ?Social History Narrative  ? Married, 3 daughters.  ?  Educ: HS + 2 yr technical school.  ? 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  ? Relocated to [facility] 2016.  ? Tob: 50 pack-yr hx; cutting back as of 01/2015.  ? No alc or drugs.  ? ?Social Determinants of Health  ? ?Financial Resource Strain: Not on file  ?Food Insecurity: Not on file  ?Transportation Needs: Not on file  ?Physical Activity: Not on file  ?Stress: Not on file  ?Social Connections: Not on file  ?Intimate Partner Violence: Not on file  ? ? ? ?BP 124/80   Pulse 79   Ht '6\' 2"'$  (1.88 m)   Wt 235 lb 6.4 oz (106.8 kg)   SpO2 94%   BMI 30.22 kg/m?  ? ?Physical Exam: ? ?Well appearing NAD ?HEENT: Unremarkable ?Neck:  No JVD, no thyromegally ?Lymphatics:  No adenopathy ?Back:  No CVA tenderness ?Lungs:  Clear with no wheezes ?HEART:  Regular rate rhythm, no murmurs, no rubs, no clicks ?Abd:  soft, positive bowel sounds, no organomegally, no rebound, no guarding ?Ext:  2 plus pulses, no edema, no cyanosis, no clubbing ?Skin:  No rashes no nodules ?Neuro:  CN II through XII intact, motor grossly intact ? ?EKG - NSR with NSSTT changes.  ? ?DEVICE  ?Normal device function.  See PaceArt for details.  ? ?Assess/Plan:  ?Crescendo angina - His symptoms are much worse since his heart cath and he has had a couple of episodes of rest pain, controlled with slntg. I have recommended repeat left heart cath. ?Chronic systolic heart failure - I have discussed the indications for ICD insertion. If he does not need PCI, then I will recommend he undergo ICD insertion. If PCI is carried out then a repeat echo in 4 months to reassess his LV function would be recommended. ? ?Carleene Overlie Salim Forero,MD ?

## 2021-12-09 NOTE — Progress Notes (Signed)
? ? ? ? ?HPI ?Craig Mccann is referred by Dr. Harl Mccann for consideration for ICD insertion for primary prevention of sudden death. He is a pleasant 71 yo man with CAD but a non-ischemic CM. His EF despite GDMT is 30%. He has had a h/o Non-obstructive CAD with a cath over a year ago demonstrating a 65% circ. He notes that over the last month he has had worsening symptoms of angina. He has mostly crescendo symptoms but has had occaisional symptoms at rest. He denies syncope.  ?Allergies  ?Allergen Reactions  ? Codeine Hives  ? Fentanyl   ?  Other reaction(s): Hypotension  ? Lexiscan [Regadenoson] Other (See Comments)  ?  Transient Heart Block  ? Valium [Diazepam] Anaphylaxis  ? ? ? ?Current Outpatient Medications  ?Medication Sig Dispense Refill  ? albuterol (PROAIR HFA) 108 (90 BASE) MCG/ACT inhaler Inhale 2 puffs into the lungs every 6 (six) hours as needed for wheezing or shortness of breath. 1 Inhaler 1  ? aspirin EC 81 MG tablet Take 1 tablet (81 mg total) by mouth 2 (two) times daily. For DVT prophylaxis for 30 days after surgery. 60 tablet 0  ? Budeson-Glycopyrrol-Formoterol 160-9-4.8 MCG/ACT AERO Inhale 2 puffs into the lungs 2 (two) times daily.    ? carvedilol (COREG) 12.5 MG tablet Take 1 tablet (12.5 mg total) by mouth 2 (two) times daily. 60 tablet 6  ? FARXIGA 10 MG TABS tablet TAKE 1 TABLET BY MOUTH DAILY BEFORE BREAKFAST 30 tablet 3  ? isosorbide mononitrate (IMDUR) 30 MG 24 hr tablet Take 0.5 tablets (15 mg total) by mouth daily. 15 tablet 6  ? nitroGLYCERIN (NITROSTAT) 0.4 MG SL tablet Place 1 tablet (0.4 mg total) under the tongue every 5 (five) minutes as needed for chest pain. 25 tablet 3  ? spironolactone (ALDACTONE) 25 MG tablet TAKE 1/2 TABLET BY MOUTH DAILY 15 tablet 3  ? traZODone (DESYREL) 50 MG tablet Take 50 mg by mouth at bedtime.    ? famotidine (PEPCID) 40 MG tablet Take 40 mg by mouth 2 (two) times daily. (Patient not taking: Reported on 12/09/2021)    ? omeprazole (PRILOSEC OTC) 20 MG  tablet Take 1 tablet (20 mg total) by mouth daily. For gastric protection (Patient not taking: Reported on 12/09/2021) 30 tablet 0  ? rosuvastatin (CRESTOR) 5 MG tablet Take 1 tablet (5 mg total) by mouth daily. (Patient not taking: Reported on 12/09/2021) 30 tablet 6  ? ?No current facility-administered medications for this visit.  ? ? ? ?Past Medical History:  ?Diagnosis Date  ? Acute prostatitis 01/2015  ? Allergy   ? Basal cell carcinoma 2017  ? Multiple: back, left shin, left arm  ? CAD (coronary artery disease)   ? a. 07/2017: cath showing 50-60% stenosis along the LCx with a FFR of 0.94 not being hemodynamically significant  ? CAP (community acquired pneumonia) 06/26/2015  ? Chronic renal insufficiency, stage II (mild)   ? stage II/II: CrCl 60s.  ? Complication of anesthesia   ? COPD (chronic obstructive pulmonary disease) ([place]) 05/07/15  ? Long time smoker + COPD changes noted on lung ca screening CT  ? Diverticulitis   ? Encounter for screening for lung cancer   ? Screening CT ok 08/27/16--repeat in 1 yr recommended.  ? Family history of colon cancer in mother   ? Hearing impairment   ? hearing aids  ? History of adenomatous polyp of colon 12/24/10  ? History of kidney stones   ? Hyperlipidemia  07/14/2016  ? started atorv 20 07/16/16  ? Kidney stones   ? bilat nonobstrucing renal calculi 2016, + left benign renal cysts  ? Melanoma ([place])   ? R posterolateral neck and R nasal ala.  Most recent was left mid back 01/2016 (Dr. Tarri Glenn).  ? Spinal headache   ? Tobacco dependence   ? CT chest for lung ca screning 04/2015 showed benign findings; 1 yr repeat recommended  ? ? ?ROS: ? ? All systems reviewed and negative except as noted in the HPI. ? ? ?Past Surgical History:  ?Procedure Laterality Date  ? CERVICAL FUSION    ? CHOLECYSTECTOMY  1987  ? COLONOSCOPY  12/24/10; 2014  ? Multiple TCSs: pt on 5 yr recall, next due 2019.  ? COLONOSCOPY N/A 10/09/2017  ? Procedure: COLONOSCOPY;  Surgeon: Danie Binder, MD;  [facility];  Service: Endoscopy;  Laterality: N/A;  1:30  ? [name]  ? INTRAVASCULAR PRESSURE WIRE/FFR STUDY N/A 07/14/2017  ? Procedure: INTRAVASCULAR PRESSURE WIRE/FFR STUDY;  Surgeon: Belva Crome, MD;  Location: [facility];  Service: Cardiovascular;  Laterality: N/A;  ? JOINT REPLACEMENT    ? left  ? KNEE SURGERY Left 2015  ? cartilage repair  ? KNEE SURGERY Right 1997  ? "          "  ? LEFT HEART CATH AND CORONARY ANGIOGRAPHY N/A 07/14/2017  ? Procedure: LEFT HEART CATH AND CORONARY ANGIOGRAPHY;  Surgeon: Belva Crome, MD;  Location: [facility];  Service: Cardiovascular;  Laterality: N/A;  ? LEFT HEART CATH AND CORONARY ANGIOGRAPHY N/A 10/25/2020  ? Procedure: LEFT HEART CATH AND CORONARY ANGIOGRAPHY;  Surgeon: Troy Sine, MD;  Location: [facility];  Service: Cardiovascular;  Laterality: N/A;  ? LITHOTRIPSY    ? NECK SURGERY  1989  ? Thumb surgery Bilateral 2005  ? and 2006  ? TOTAL HIP ARTHROPLASTY Left 11/20/2020  ? Procedure: TOTAL HIP ARTHROPLASTY ANTERIOR APPROACH;  Surgeon: Renette Butters, MD;  Location: [facility];  Service: Orthopedics;  Laterality: Left;  ? TOTAL KNEE ARTHROPLASTY Left 04/23/2016  ? Procedure: TOTAL KNEE ARTHROPLASTY;  Surgeon: Ninetta Lights, MD;  Location: [facility];  Service: Orthopedics;  Laterality: Left;  ? ? ? ?Family History  ?Problem Relation Age of Onset  ? Colon cancer Mother   ? Cancer Mother   ? Lung cancer Father   ? Cancer Father   ? Brain cancer Brother   ? Cancer Brother   ? ? ? ?Social History  ? ?Socioeconomic History  ? Marital status: Married  ?  Spouse name: Not on file  ? Number of children: Not on file  ? Years of education: Not on file  ? Highest education level: Not on file  ?Occupational History  ? Not on file  ?Tobacco Use  ? Smoking status: Some Days  ?  Packs/day: 0.50  ?  Years: 40.00  ?  Pack years: 20.00  ?  Types: Cigarettes  ?  Start date: 08/04/1968  ? Smokeless tobacco: Never  ? Tobacco comments:  ?   half pack a day  ?Vaping Use  ? Vaping Use: Never used  ?Substance and Sexual Activity  ? Alcohol use: No  ?  Alcohol/week: 0.0 standard drinks  ? Drug use: No  ? Sexual activity: Yes  ?Other Topics Concern  ? Not on file  ?Social History Narrative  ? Married, 3 daughters.  ?  Educ: HS + 2 yr technical school.  ? 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  ? Relocated to [facility] 2016.  ? Tob: 50 pack-yr hx; cutting back as of 01/2015.  ? No alc or drugs.  ? ?Social Determinants of Health  ? ?Financial Resource Strain: Not on file  ?Food Insecurity: Not on file  ?Transportation Needs: Not on file  ?Physical Activity: Not on file  ?Stress: Not on file  ?Social Connections: Not on file  ?Intimate Partner Violence: Not on file  ? ? ? ?BP 124/80   Pulse 79   Ht '6\' 2"'$  (1.88 m)   Wt 235 lb 6.4 oz (106.8 kg)   SpO2 94%   BMI 30.22 kg/m?  ? ?Physical Exam: ? ?Well appearing NAD ?HEENT: Unremarkable ?Neck:  No JVD, no thyromegally ?Lymphatics:  No adenopathy ?Back:  No CVA tenderness ?Lungs:  Clear with no wheezes ?HEART:  Regular rate rhythm, no murmurs, no rubs, no clicks ?Abd:  soft, positive bowel sounds, no organomegally, no rebound, no guarding ?Ext:  2 plus pulses, no edema, no cyanosis, no clubbing ?Skin:  No rashes no nodules ?Neuro:  CN II through XII intact, motor grossly intact ? ?EKG - NSR with NSSTT changes.  ? ?DEVICE  ?Normal device function.  See PaceArt for details.  ? ?Assess/Plan:  ?Crescendo angina - His symptoms are much worse since his heart cath and he has had a couple of episodes of rest pain, controlled with slntg. I have recommended repeat left heart cath. ?Chronic systolic heart failure - I have discussed the indications for ICD insertion. If he does not need PCI, then I will recommend he undergo ICD insertion. If PCI is carried out then a repeat echo in 4 months to reassess his LV function would be recommended. ? ?Carleene Overlie Sherelle Castelli,MD ?

## 2021-12-09 NOTE — Patient Instructions (Signed)
Medication Instructions:  ?Your physician recommends that you continue on your current medications as directed. Please refer to the Current Medication list given to you today. ? ?Labwork: ?You will get lab work today:  CBC and BMP ? ?Testing/Procedures: ?None ordered. ? ?Follow-Up: ? ?Based on results of your cardiac catheterization. ? ?Any Other Special Instructions Will Be Listed Below (If Applicable). ? ?If you need a refill on your cardiac medications before your next appointment, please call your pharmacy.  ? ? ?[facility] ?[place] OFFICE ?[address], [address] ?[place] [address] ?Dept: [phone] ?Loc: [phone] ? ?Craig Mccann  12/09/2021 ? ?You are scheduled for a Cardiac Catheterization on Thursday, May 11 with Dr. Daneen Schick. ? ?1. Please arrive at the Main Entrance A at [facility]: [place], [place] [address] at 7:00 AM (This time is two hours before your procedure to ensure your preparation). Free valet parking service is available.  ? ?Special note: Every effort is made to have your procedure done on time. Please understand that emergencies sometimes delay scheduled procedures. ? ?2. Diet: Do not eat solid foods after midnight.  You may have clear liquids until 5 AM upon the day of the procedure. ? ?3. Labs: today ? ?4. Medication instructions in preparation for your procedure: ? ? On the morning of your procedure, take your ASPIRIN and carvedilol ONLY with sips of water. ? ?5. Plan to go home the same day, you will only stay overnight if medically necessary. ?6. You MUST have a responsible adult to drive you home. ?7. An adult MUST be with you the first 24 hours after you arrive home. ?8. Bring a current list of your medications, and the last time and date medication taken. ?9. Bring ID and current insurance cards. ?10.Please wear clothes that are easy to get on and off and wear slip-on  shoes. ? ?Thank you for allowing [place] to care for you! ?  -- [facility] ? ?

## 2021-12-10 ENCOUNTER — Telehealth: Payer: Self-pay | Admitting: *Deleted

## 2021-12-10 NOTE — Telephone Encounter (Signed)
Cardiac Catheterization scheduled at [facility] for: Thursday Dec 12, 2021 12 Noon ?Arrival time and place: [facility] Entrance A at: 7 AM-pre-procedure hydration ? ?Nothing to eat after midnight prior to procedure, clear liquids until 5 AM day of procedure. ? ?Medication instructions: ?-Hold: ? Spironolactone-day before and day of procedure-per protocol GFR 43 ? Farxiga-AM of procedure ?-Except hold medications Usual morning medications can be taken with sips of water including aspirin 81 mg. ? ?Confirmed patient has responsible adult to drive home post procedure and be with patient first 24 hours after arriving home. ? ?Patient reports no new symptoms concerning for COVID-19/no exposure to COVID-19 in the past 10 days. ? ?Reviewed procedure instructions,discussed pre-procedure hydration with patient. ? ?

## 2021-12-10 NOTE — Telephone Encounter (Signed)
Per Dr Vergia Alberts with pre-procedure hydration and weight based IV flow rate for hydration. ?

## 2021-12-12 ENCOUNTER — Ambulatory Visit (HOSPITAL_COMMUNITY)
Admission: RE | Admit: 2021-12-12 | Discharge: 2021-12-12 | Disposition: A | Discharge status: Home / Self Care | Payer: Medicare HMO | Source: Ambulatory Visit | Attending: Cardiology | Admitting: Cardiology

## 2021-12-12 ENCOUNTER — Encounter (HOSPITAL_COMMUNITY)
Admission: RE | Disposition: A | Discharge status: Home / Self Care | Payer: Self-pay | Source: Ambulatory Visit | Attending: Cardiology

## 2021-12-12 ENCOUNTER — Other Ambulatory Visit: Payer: Self-pay

## 2021-12-12 DIAGNOSIS — I5022 Chronic systolic (congestive) heart failure: Secondary | ICD-10-CM | POA: Diagnosis not present

## 2021-12-12 DIAGNOSIS — F17210 Nicotine dependence, cigarettes, uncomplicated: Secondary | ICD-10-CM | POA: Insufficient documentation

## 2021-12-12 DIAGNOSIS — I25110 Atherosclerotic heart disease of native coronary artery with unstable angina pectoris: Secondary | ICD-10-CM | POA: Diagnosis not present

## 2021-12-12 DIAGNOSIS — I428 Other cardiomyopathies: Secondary | ICD-10-CM | POA: Diagnosis not present

## 2021-12-12 DIAGNOSIS — R69 Illness, unspecified: Secondary | ICD-10-CM | POA: Diagnosis not present

## 2021-12-12 DIAGNOSIS — I25119 Atherosclerotic heart disease of native coronary artery with unspecified angina pectoris: Secondary | ICD-10-CM | POA: Diagnosis not present

## 2021-12-12 DIAGNOSIS — I209 Angina pectoris, unspecified: Secondary | ICD-10-CM

## 2021-12-12 HISTORY — PX: LEFT HEART CATH AND CORONARY ANGIOGRAPHY: CATH118249

## 2021-12-12 SURGERY — LEFT HEART CATH AND CORONARY ANGIOGRAPHY
Anesthesia: LOCAL

## 2021-12-12 MED ORDER — MIDAZOLAM HCL 2 MG/2ML IJ SOLN
INTRAMUSCULAR | Status: DC | PRN
  Administered 2021-12-12: 1 mg via INTRAVENOUS

## 2021-12-12 MED ORDER — SODIUM CHLORIDE 0.9% FLUSH: 3.0000 mL | Freq: Two times a day (BID) | INTRAVENOUS | Status: DC

## 2021-12-12 MED ORDER — HEPARIN (PORCINE) IN NACL 1000-0.9 UT/500ML-% IV SOLN
INTRAVENOUS | Status: DC | PRN
  Administered 2021-12-12 (×2): 500 mL

## 2021-12-12 MED ORDER — SODIUM CHLORIDE 0.9% FLUSH: 3.0000 mL | INTRAVENOUS | Status: DC | PRN

## 2021-12-12 MED ORDER — LIDOCAINE HCL (PF) 1 % IJ SOLN
INTRAMUSCULAR | Status: AC
  Filled 2021-12-12: qty 30

## 2021-12-12 MED ORDER — SODIUM CHLORIDE 0.9 % IV SOLN: 250.0000 mL | INTRAVENOUS | Status: DC | PRN

## 2021-12-12 MED ORDER — HEPARIN (PORCINE) IN NACL 1000-0.9 UT/500ML-% IV SOLN
INTRAVENOUS | Status: AC
  Filled 2021-12-12: qty 1000

## 2021-12-12 MED ORDER — LIDOCAINE HCL (PF) 1 % IJ SOLN
INTRAMUSCULAR | Status: DC | PRN
  Administered 2021-12-12: 15 mL

## 2021-12-12 MED ORDER — SODIUM CHLORIDE 0.9 % WEIGHT BASED INFUSION: 1.0000 mL/kg/h | INTRAVENOUS | Status: DC

## 2021-12-12 MED ORDER — ASPIRIN 81 MG PO CHEW: 81.0000 mg | CHEWABLE_TABLET | ORAL | Status: DC

## 2021-12-12 MED ORDER — ACETAMINOPHEN 325 MG PO TABS: 650.0000 mg | ORAL_TABLET | ORAL | Status: DC | PRN

## 2021-12-12 MED ORDER — ONDANSETRON HCL 4 MG/2ML IJ SOLN: 4.0000 mg | Freq: Four times a day (QID) | INTRAMUSCULAR | Status: DC | PRN

## 2021-12-12 MED ORDER — MIDAZOLAM HCL 2 MG/2ML IJ SOLN
INTRAMUSCULAR | Status: AC
  Filled 2021-12-12: qty 2

## 2021-12-12 MED ORDER — FENTANYL CITRATE (PF) 100 MCG/2ML IJ SOLN
INTRAMUSCULAR | Status: DC | PRN
  Administered 2021-12-12: 25 ug via INTRAVENOUS

## 2021-12-12 MED ORDER — FENTANYL CITRATE (PF) 100 MCG/2ML IJ SOLN
INTRAMUSCULAR | Status: AC
  Filled 2021-12-12: qty 2

## 2021-12-12 MED ORDER — IOHEXOL 350 MG/ML SOLN
INTRAVENOUS | Status: DC | PRN
  Administered 2021-12-12: 50 mL

## 2021-12-12 MED ORDER — HYDRALAZINE HCL 20 MG/ML IJ SOLN: 10.0000 mg | INTRAMUSCULAR | Status: DC | PRN

## 2021-12-12 MED ORDER — SODIUM CHLORIDE 0.9 % WEIGHT BASED INFUSION
3.0000 mL/kg/h | INTRAVENOUS | Status: AC
  Administered 2021-12-12: 3 mL/kg/h via INTRAVENOUS

## 2021-12-12 SURGICAL SUPPLY — 9 items
CATH INFINITI 5FR MULTPACK ANG (CATHETERS) ×1 IMPLANT
CLOSURE MYNX CONTROL 5F (Vascular Products) ×1 IMPLANT
KIT HEART LEFT (KITS) ×3 IMPLANT
PACK CARDIAC CATHETERIZATION (CUSTOM PROCEDURE TRAY) ×3 IMPLANT
SHEATH PINNACLE 5F 10CM (SHEATH) ×1 IMPLANT
SHEATH PROBE COVER 6X72 (BAG) ×1 IMPLANT
TRANSDUCER W/STOPCOCK (MISCELLANEOUS) ×3 IMPLANT
TUBING CIL FLEX 10 FLL-RA (TUBING) ×3 IMPLANT
WIRE EMERALD 3MM-J .035X150CM (WIRE) ×1 IMPLANT

## 2021-12-12 NOTE — Interval H&P Note (Signed)
History and Physical Interval Note: ? ?12/12/2021 ?12:35 PM ? ?Craig Mccann  has presented today for surgery, with the diagnosis of angina.  The various methods of treatment have been discussed with the patient and family. After consideration of risks, benefits and other options for treatment, the patient has consented to  Procedure(s): ?LEFT HEART CATH AND CORONARY ANGIOGRAPHY (N/A) as a surgical intervention.  The patient's history has been reviewed, patient examined, no change in status, stable for surgery.  I have reviewed the patient's chart and labs.  Questions were answered to the patient's satisfaction.   ?Cath Lab Visit (complete for each Cath Lab visit) ? ?Clinical Evaluation Leading to the Procedure:  ? ?ACS: No. ? ?Non-ACS:   ? ?Anginal Classification: CCS III ? ?Anti-ischemic medical therapy: Maximal Therapy (2 or more classes of medications) ? ?Non-Invasive Test Results: No non-invasive testing performed ? ?Prior CABG: No previous CABG ? ? ? ? ? ? ? ?Craig Mccann ?12/12/2021 ?12:36 PM ? ? ? ?

## 2021-12-12 NOTE — Progress Notes (Signed)
Up and walked and tolerated well; right groin stable, no bleeding or hematoma 

## 2021-12-13 ENCOUNTER — Encounter (HOSPITAL_COMMUNITY): Payer: Self-pay | Admitting: Cardiology

## 2021-12-18 ENCOUNTER — Telehealth: Payer: Self-pay | Admitting: Cardiology

## 2021-12-18 DIAGNOSIS — M25561 Pain in right knee: Secondary | ICD-10-CM | POA: Diagnosis not present

## 2021-12-18 NOTE — Telephone Encounter (Signed)
? ?  Pre-operative Risk Assessment  ?  ?Patient Name: Craig Mccann  ?DOB: 09/05/1950 ?MRN: [id number]  ? ?  ? ?Request for Surgical Clearance   ? ?Procedure:   RT Total Knee Replacement  ? ?Date of Surgery:  Clearance TBD                              ?   ?Surgeon:  Edmonia Lynch MD  ?Surgeon's Group or Practice Name:  [facility]  ?Phone number: [phone] ?Fax number:  [phone] ?  ?Type of Clearance Requested:  ?Both  ?  ?Type of Anesthesia:  Spinal ?  ?Additional requests/questions:   ? ?Signed, ?Malanie C Hildebrandt   ?12/18/2021, 12:16 PM  ? ?

## 2021-12-19 NOTE — Telephone Encounter (Signed)
   Name: Craig Mccann  DOB: 07/02/1951  MRN: [id number]  Primary Cardiologist: Carlyle Dolly, MD / EP Dr. Lovena Le  Chart reviewed as part of pre-operative protocol coverage. Patient was recently seen in the office 12/09/21 by Dr. Lovena Le for evaluation of ICD insertion for chronic systolic CHF. At that visit he had worsening angina so cath was recommended showing nonobstructive disease. Dr. Tanna Furry note had indicated a plan for ICD insertion if he did not need PCI. Given recent workup/evaluation for chest pain and pending ICD plans, will route to Dr. Lovena Le for input on surgical clearance for right total knee replacement as requested. Has f/u with Dr. Harl Bowie in July scheduled but do not see any further EP f/u yet. Dr. Lovena Le - Please route response to [facility] PREOP (the pre-op pool). Thank you.  Jenin Birdsall PA-C

## 2021-12-19 NOTE — Telephone Encounter (Signed)
Ok to proceed with knee surgery. He is an acceptable risk in light of his heart cath. ICD insertion can be done several weeks after the knee surgery.

## 2021-12-20 NOTE — Telephone Encounter (Signed)
   Patient Name: Craig Mccann  DOB: 1951-04-07 MRN: [id number]  Primary Cardiologist: Carlyle Dolly, MD / EP - Dr. Lovena Le  Chart reviewed as part of pre-operative protocol coverage. Per Dr. Tanna Furry review, "Ok to proceed with knee surgery. He is an acceptable risk in light of his heart cath. ICD insertion can be done several weeks after the knee surgery."  I will route this message to Dr. Tanna Furry nurse so she is aware of Dr. Tanna Furry recommendations regarding ICD insertion since no official EP f/u scheduled yet.  Will route Dr. Tanna Furry pre-op recommendation to requesting provider via Epic fax function. Please call with questions.   Charlie Pitter, PA-C 12/20/2021, 8:47 AM

## 2021-12-25 ENCOUNTER — Ambulatory Visit: Payer: Medicare HMO | Admitting: Internal Medicine

## 2022-01-14 ENCOUNTER — Telehealth: Payer: Self-pay

## 2022-01-14 NOTE — Telephone Encounter (Signed)
error 

## 2022-01-30 ENCOUNTER — Encounter: Payer: Self-pay | Admitting: Cardiology

## 2022-01-30 ENCOUNTER — Ambulatory Visit: Payer: Medicare HMO | Admitting: Cardiology

## 2022-01-30 VITALS — BP 128/86 | HR 83 | Ht 73.0 in | Wt 222.8 lb

## 2022-01-30 DIAGNOSIS — I5022 Chronic systolic (congestive) heart failure: Secondary | ICD-10-CM | POA: Diagnosis not present

## 2022-01-30 DIAGNOSIS — I25118 Atherosclerotic heart disease of native coronary artery with other forms of angina pectoris: Secondary | ICD-10-CM | POA: Diagnosis not present

## 2022-01-30 DIAGNOSIS — E782 Mixed hyperlipidemia: Secondary | ICD-10-CM | POA: Diagnosis not present

## 2022-01-30 MED ORDER — ISOSORBIDE MONONITRATE ER 30 MG PO TB24: 30.0000 mg | ORAL_TABLET | Freq: Every day | ORAL | 3 refills | Status: DC

## 2022-01-30 NOTE — Addendum Note (Signed)
Addended by: Laurine Blazer on: 01/30/2022 04:17 PM   Modules accepted: Orders

## 2022-01-30 NOTE — Patient Instructions (Signed)
Medication Instructions:  Increase Imdur to '30mg'$  daily  Continue all other medications.     Labwork: none  Testing/Procedures: none  Follow-Up: 4 months   Any Other Special Instructions Will Be Listed Below (If Applicable).   If you need a refill on your cardiac medications before your next appointment, please call your pharmacy.

## 2022-01-30 NOTE — Progress Notes (Addendum)
Clinical Summary Mr. Donahoe is a 71 y.o.male maleseen today for follow up of the following medical problems.    1. Chest pain/CAD 06/2017 nuclear stress: moderate mid inferoseptal and mid inferior defect with peri-infarct ischemia. LVEF 46% - 06/2017 echo LVEF 62-22%, grade I diastolic dysfunction     04/7988 cath: LAD patent, D1 20%, ramus 25%, LCX 65% prox, RCA mid 15% and distal 30% - chronic episodes of chest pains roughly 3 a month, improve with with NG     12/2021 cath: LAD patent, D1 20%, ramus 25%, prox LCX 50%, RCA mi 15% and distal 30% - severe episode of chest pain 3 weeks ago. None since.      2. Chronic systolic HF New diagnosis of systolic HF in 09/1192 08/7406 echo LVEF 30-35%, mod to severe asymmetric septal hypertrophy, grade I dd,  10/2020 cath: LAD patent, D1 20%, ramus 25%, LCX 65% prox, RCA mid 15% and distal 30%    - significant elevation in Cr with losartan to 1.8 on 06/12/21 - medication stopped, at recheck Cr back to baseline 1.48     -last visit started aldactone 12.'5mg'$  daily, farxiga '10mg'$  daiy. - repeat labs Cr 1.54, K 4.4 -no recent symptoms.  -appears he accidentally restarted his losartan   - no recent edema. No SOB/DOE - considering ICD after knee surgery.    3. Hyperlipidemia 03/2021 TC 202 TG 117 HDL 39 LDL 142 - reports joint pains, he stopped taking atorvastatin about 1 month and symptoms improved. - ongoing muscle aches with crestor.    4.Preop  -considering knee replacement    Past Medical History:  Diagnosis Date   Acute prostatitis 01/2015   Allergy    Basal cell carcinoma 2017   Multiple: back, left shin, left arm   CAD (coronary artery disease)    a. 07/2017: cath showing 50-60% stenosis along the LCx with a FFR of 0.94 not being hemodynamically significant   CAP (community acquired pneumonia) 06/26/2015   Chronic renal insufficiency, stage II (mild)    stage II/II: CrCl 60s.   Complication of anesthesia    COPD  (chronic obstructive pulmonary disease) ([place]) 05/07/15   Long time smoker + COPD changes noted on lung ca screening CT   Diverticulitis    Encounter for screening for lung cancer    Screening CT ok 08/27/16--repeat in 1 yr recommended.   Family history of colon cancer in mother    Hearing impairment    hearing aids   History of adenomatous polyp of colon 12/24/10   History of kidney stones    Hyperlipidemia 07/14/2016   started atorv 20 07/16/16   Kidney stones    bilat nonobstrucing renal calculi 2016, + left benign renal cysts   Melanoma ([place])    R posterolateral neck and R nasal ala.  Most recent was left mid back 01/2016 (Dr. Tarri Glenn).   Spinal headache    Tobacco dependence    CT chest for lung ca screning 04/2015 showed benign findings; 1 yr repeat recommended     Allergies  Allergen Reactions   Codeine Hives   Fentanyl     Other reaction(s): Hypotension   Lexiscan [Regadenoson] Other (See Comments)    Transient Heart Block   Valium [Diazepam] Anaphylaxis     Current Outpatient Medications  Medication Sig Dispense Refill   albuterol (PROAIR HFA) 108 (90 BASE) MCG/ACT inhaler Inhale 2 puffs into the lungs every 6 (six) hours as needed for wheezing or shortness of  breath. 1 Inhaler 1   aspirin EC 81 MG tablet Take 1 tablet (81 mg total) by mouth 2 (two) times daily. For DVT prophylaxis for 30 days after surgery. (Patient taking differently: Take 81 mg by mouth daily. For DVT prophylaxis for 30 days after surgery.) 60 tablet 0   Budeson-Glycopyrrol-Formoterol 160-9-4.8 MCG/ACT AERO Inhale 2 puffs into the lungs 2 (two) times daily.     carvedilol (COREG) 12.5 MG tablet Take 1 tablet (12.5 mg total) by mouth 2 (two) times daily. 180 tablet 3   FARXIGA 10 MG TABS tablet TAKE 1 TABLET BY MOUTH DAILY BEFORE BREAKFAST 30 tablet 3   isosorbide mononitrate (IMDUR) 30 MG 24 hr tablet Take 0.5 tablets (15 mg total) by mouth daily. 15 tablet 6   nitroGLYCERIN (NITROSTAT) 0.4 MG SL tablet  Place 1 tablet (0.4 mg total) under the tongue every 5 (five) minutes as needed for chest pain. 25 tablet 3   Omega-3 Fatty Acids (OMEGA 3 PO) Take 1 tablet by mouth See admin instructions. Take 2 tab in the morning and 1 later in the afternoon  Pro Omega LDL W / Red yeast Rice     omeprazole (PRILOSEC OTC) 20 MG tablet Take 1 tablet (20 mg total) by mouth daily. For gastric protection (Patient not taking: Reported on 12/09/2021) 30 tablet 0   rosuvastatin (CRESTOR) 5 MG tablet Take 1 tablet (5 mg total) by mouth daily. (Patient not taking: Reported on 12/09/2021) 30 tablet 6   spironolactone (ALDACTONE) 25 MG tablet TAKE 1/2 TABLET BY MOUTH DAILY 15 tablet 3   traZODone (DESYREL) 50 MG tablet Take 50 mg by mouth at bedtime.     No current facility-administered medications for this visit.     Past Surgical History:  Procedure Laterality Date   CERVICAL FUSION     CHOLECYSTECTOMY  1987   COLONOSCOPY  12/24/10; 2014   Multiple TCSs: pt on 5 yr recall, next due 2019.   COLONOSCOPY N/A 10/09/2017   Procedure: COLONOSCOPY;  Surgeon: Danie Binder, MD;  [facility];  Service: Endoscopy;  Laterality: N/A;  1:30   INGUINAL HERNIA REPAIR  1984   INTRAVASCULAR PRESSURE WIRE/FFR STUDY N/A 07/14/2017   Procedure: INTRAVASCULAR PRESSURE WIRE/FFR STUDY;  Surgeon: Belva Crome, MD;  Location: [facility];  Service: Cardiovascular;  Laterality: N/A;   JOINT REPLACEMENT     left   KNEE SURGERY Left 2015   cartilage repair   KNEE SURGERY Right 1997   "          "   LEFT HEART CATH AND CORONARY ANGIOGRAPHY N/A 07/14/2017   Procedure: LEFT HEART CATH AND CORONARY ANGIOGRAPHY;  Surgeon: Belva Crome, MD;  Location: [facility];  Service: Cardiovascular;  Laterality: N/A;   LEFT HEART CATH AND CORONARY ANGIOGRAPHY N/A 10/25/2020   Procedure: LEFT HEART CATH AND CORONARY ANGIOGRAPHY;  Surgeon: Troy Sine, MD;  Location: [facility];  Service: Cardiovascular;   Laterality: N/A;   LEFT HEART CATH AND CORONARY ANGIOGRAPHY N/A 12/12/2021   Procedure: LEFT HEART CATH AND CORONARY ANGIOGRAPHY;  Surgeon: Martinique, Peter M, MD;  Location: [facility];  Service: Cardiovascular;  Laterality: N/A;   LITHOTRIPSY     NECK SURGERY  1989   Thumb surgery Bilateral 2005   and 2006   TOTAL HIP ARTHROPLASTY Left 11/20/2020   Procedure: TOTAL HIP ARTHROPLASTY ANTERIOR APPROACH;  Surgeon: Renette Butters, MD;  Location: [facility];  Service: Orthopedics;  Laterality: Left;   TOTAL KNEE ARTHROPLASTY Left 04/23/2016   Procedure: TOTAL KNEE ARTHROPLASTY;  Surgeon: Ninetta Lights, MD;  Location: [facility];  Service: Orthopedics;  Laterality: Left;     Allergies  Allergen Reactions   Codeine Hives   Fentanyl     Other reaction(s): Hypotension   Lexiscan [Regadenoson] Other (See Comments)    Transient Heart Block   Valium [Diazepam] Anaphylaxis      Family History  Problem Relation Age of Onset   Colon cancer Mother    Cancer Mother    Lung cancer Father    Cancer Father    Brain cancer Brother    Cancer Brother      Social History Mr. Bergdoll reports that he has been smoking cigarettes. He started smoking about 53 years ago. He has a 20.00 pack-year smoking history. He has never used smokeless tobacco. Mr. Womac reports no history of alcohol use.   Review of Systems CONSTITUTIONAL: No weight loss, fever, chills, weakness or fatigue.  HEENT: Eyes: No visual loss, blurred vision, double vision or yellow sclerae.No hearing loss, sneezing, congestion, runny nose or sore throat.  SKIN: No rash or itching.  CARDIOVASCULAR: per hpi RESPIRATORY: No shortness of breath, cough or sputum.  GASTROINTESTINAL: No anorexia, nausea, vomiting or diarrhea. No abdominal pain or blood.  GENITOURINARY: No burning on urination, no polyuria NEUROLOGICAL: No headache, dizziness, syncope, paralysis, ataxia, numbness or tingling in the extremities. No change in bowel or  bladder control.  MUSCULOSKELETAL: No muscle, back pain, joint pain or stiffness.  LYMPHATICS: No enlarged nodes. No history of splenectomy.  PSYCHIATRIC: No history of depression or anxiety.  ENDOCRINOLOGIC: No reports of sweating, cold or heat intolerance. No polyuria or polydipsia.  [name]   Physical Examination Today's Vitals   01/30/22 1545  BP: 128/86  Pulse: 83  SpO2: 95%  Weight: 222 lb 12.8 oz (101.1 kg)  Height: '6\' 1"'$  (1.854 m)   Body mass index is 29.39 kg/m.  Gen: resting comfortably, no acute distress HEENT: no scleral icterus, pupils equal round and reactive, no palptable cervical adenopathy,  CV: RRR, no m/r/g, no jvd Resp: Clear to auscultation bilaterally GI: abdomen is soft, non-tender, non-distended, normal bowel sounds, no hepatosplenomegaly MSK: extremities are warm, no edema.  Skin: warm, no rash Neuro:  no focal deficits Psych: appropriate affect   Diagnostic Studies 06/2017 nuclear stress There was no ST segment deviation noted during stress. Defect 1: There is a medium defect of moderate severity present in the mid inferoseptal and mid inferior location. Findings consistent with prior myocardial infarction with peri-infarct ischemia. Variable soft tissue attenuation artifact is also contributing to defect. This is a low to intermediate risk study. Nuclear stress EF: 46%.   11.2018 echoStudy Conclusions   - Left ventricle: The cavity size was normal. Wall thickness was   increased increased in a pattern of mild to moderate LVH.   Systolic function was mildly reduced. The estimated ejection   fraction was in the range of 45% to 50%. Doppler parameters are   consistent with abnormal left ventricular relaxation (grade 1   diastolic dysfunction). - Aortic valve: Valve area (VTI): 1.97 cm^2. Valve area (Vmax):   2.04 cm^2. Valve area (Vmean): 2 cm^2. - Technically adequate study.   10/2020 cath Ost 1st Diag lesion is 20% stenosed. Dist RCA lesion is  30% stenosed. Ramus lesion is 25% stenosed. Prox Cx to Mid Cx lesion is 65% stenosed. Mid RCA lesion is 15%  stenosed.   Mild to moderate nonobstructive CAD with 20% irregularity in the first diagonal Jaylen Claude of the LAD, 20% stenosis in the ramus intermediate vessel, smooth 20% proximal circumflex stenosis with no change in the previously noted 65% mid AV groove circumflex stenosis, and mild 15 and 30% smooth narrowings in a dominant RCA.   No significant change since the prior catheterization of 2019.   LVEDP 15 mmHg.   RECOMMENDATION: Guideline directed medical therapy for nonischemic cardiomyopathy with reduced EF at 30 to 35%.  The patient will return to Dr. Harl Bowie.  Smoking cessation is essential.  Aggressive lipid-lowering therapy with target LDL less than 70     10/2020 echo IMPRESSIONS     1. Left ventricular ejection fraction, by estimation, is 30 to 35%. The  left ventricle has moderately decreased function. The left ventricle  demonstrates global hypokinesis. The left ventricular internal cavity size  was mildly dilated. There is moderate   to severe asymmetric left ventricular hypertrophy of the septal segment.  Left ventricular diastolic parameters are consistent with Grade I  diastolic dysfunction (impaired relaxation).   2. Right ventricular systolic function is normal. The right ventricular  size is normal. There is normal pulmonary artery systolic pressure. The  estimated right ventricular systolic pressure is 28.3 mmHg.   3. There is a trivial pericardial effusion anterior to the right  ventricle.   4. The mitral valve is abnormal. Trivial mitral valve regurgitation.   5. The aortic valve is tricuspid. There is mild calcification of the  aortic valve. Aortic valve regurgitation is not visualized. Mild aortic  valve sclerosis is present, with no evidence of aortic valve stenosis.   6. The inferior vena cava is normal in size with greater than 50%  respiratory  variability, suggesting right atrial pressure of 3 mmHg.     12/2021 cath Dist RCA lesion is 30% stenosed.   Mid RCA lesion is 15% stenosed.   Ramus lesion is 25% stenosed.   Ost 1st Diag lesion is 20% stenosed.   Prox Cx to Mid Cx lesion is 50% stenosed.   LV end diastolic pressure is normal.   Nonobstructive CAD. If anything the lesion in the mid LCx is improved from prior. FFR of this lesion in 2018 was normal.  Normal LVEDP   Plan: I don't see anything from a coronary standpoint that would explain his chest pain symptoms particularly at rest. Would consider noncardiac causes of pain. Continue medical management.  Assessment and Plan   1. Chronic systolic HF - new diagnosis 10/2020 - significant elevation of Cr with just low dose losartan, discontinued. Would avoid ACE/ARB/ARNI.  - on max tolerated regimen. - seen by EP, plans for ICD after knee replacement     2. CAD with chronic stable angina - recent cath as reported above - intermittent chest pains. Perhaps spasm or microvasc disease. Increase imdur to '30mg'$  daily.    3. Hyperlipidemia - has not tolerated atorva or rosuvastatin, refer to lipid clinic to consider [id number]  4.Preop eval - ok from cardiac standpoint to proceed with knee replacement  F/u 4 months     Arnoldo Lenis, M.D.

## 2022-01-31 ENCOUNTER — Ambulatory Visit: Payer: Medicare HMO | Admitting: Cardiology

## 2022-03-11 DIAGNOSIS — C44329 Squamous cell carcinoma of skin of other parts of face: Secondary | ICD-10-CM | POA: Diagnosis not present

## 2022-03-11 DIAGNOSIS — D485 Neoplasm of uncertain behavior of skin: Secondary | ICD-10-CM | POA: Diagnosis not present

## 2022-03-11 DIAGNOSIS — C44529 Squamous cell carcinoma of skin of other part of trunk: Secondary | ICD-10-CM | POA: Diagnosis not present

## 2022-03-11 DIAGNOSIS — L570 Actinic keratosis: Secondary | ICD-10-CM | POA: Diagnosis not present

## 2022-03-20 DIAGNOSIS — D485 Neoplasm of uncertain behavior of skin: Secondary | ICD-10-CM | POA: Diagnosis not present

## 2022-03-20 DIAGNOSIS — C44329 Squamous cell carcinoma of skin of other parts of face: Secondary | ICD-10-CM | POA: Diagnosis not present

## 2022-03-20 DIAGNOSIS — C44529 Squamous cell carcinoma of skin of other part of trunk: Secondary | ICD-10-CM | POA: Diagnosis not present

## 2022-03-24 DIAGNOSIS — M25561 Pain in right knee: Secondary | ICD-10-CM | POA: Diagnosis not present

## 2022-03-27 DIAGNOSIS — C44529 Squamous cell carcinoma of skin of other part of trunk: Secondary | ICD-10-CM | POA: Diagnosis not present

## 2022-05-02 NOTE — Patient Instructions (Addendum)
SURGICAL WAITING ROOM VISITATION Patients having surgery or a procedure may have no more than 2 support people in the waiting area - these visitors may rotate.   Children under the age of 86 must have an adult with them who is not the patient. If the patient needs to stay at the hospital during part of their recovery, the visitor guidelines for inpatient rooms apply. Pre-op nurse will coordinate an appropriate time for 1 support person to accompany patient in pre-op.  This support person may not rotate.    Please refer to the [facility] website for the visitor guidelines for Inpatients (after your surgery is over and you are in a regular room).      Your procedure is scheduled on: 05-27-22   Report to [facility] Main Entrance    Report to admitting at 5:15 AM   Call this number if you have problems the morning of surgery [phone]   Do not eat food :After Midnight.   After Midnight you may have the following liquids until 4:30 AM DAY OF SURGERY  Water Non-Citrus Juices (without pulp, NO RED) Carbonated Beverages Black Coffee (NO MILK/CREAM OR CREAMERS, sugar ok)  Clear Tea (NO MILK/CREAM OR CREAMERS, sugar ok) regular and decaf                             Plain Jell-O (NO RED)                                           Fruit ices (not with fruit pulp, NO RED)                                     Popsicles (NO RED)                                                               Sports drinks like Gatorade (NO RED)                   The day of surgery:  Drink ONE (1) Pre-Surgery Clear Ensure at 4:30 AM the morning of surgery. Drink in one sitting. Do not sip.  This drink was given to you during your hospital  pre-op appointment visit. Nothing else to drink after completing the Pre-Surgery Clear Ensure G2.          If you have questions, please contact your surgeon's office.   FOLLOW ANY ADDITIONAL PRE OP INSTRUCTIONS YOU RECEIVED FROM YOUR SURGEON'S OFFICE!!!      Oral Hygiene is also important to reduce your risk of infection.                                    Remember - BRUSH YOUR TEETH THE MORNING OF SURGERY WITH YOUR REGULAR TOOTHPASTE   Do NOT smoke after Midnight   Take these medicines the morning of surgery with A SIP OF WATER:   Carvedilol  Isosorbide  Okay to use inhalers  -Hold  Farxiga x3 days before surgery   Bring CPAP mask and tubing day of surgery.                              You may not have any metal on your body including jewelry, and body piercing             Do not wear lotions, powders, cologne, or deodorant             Men may shave face and neck.   Do not bring valuables to the hospital. [place].   Contacts, dentures or bridgework may not be worn into surgery.   Bring small overnight bag day of surgery.   DO NOT [place]. PHARMACY WILL DISPENSE MEDICATIONS LISTED ON YOUR MEDICATION LIST TO YOU DURING YOUR ADMISSION [place]!    Special Instructions: Bring a copy of your healthcare power of attorney and living will documents the day of surgery if you haven't scanned them before.  Please read over the following fact sheets you were given: IF [place]  If you received a COVID test during your pre-op visit  it is requested that you wear a mask when out in public, stay away from anyone that may not be feeling well and notify your surgeon if you develop symptoms. If you test positive for Covid or have been in contact with anyone that has tested positive in the last 10 days please notify you surgeon.  [facility] - Preparing for Surgery Before surgery, you can play an important role.  Because skin is not sterile, your skin needs to be as free of germs as possible.  You can reduce the number of germs on your skin by washing with CHG (chlorahexidine gluconate) soap before  surgery.  CHG is an antiseptic cleaner which kills germs and bonds with the skin to continue killing germs even after washing. Please DO NOT use if you have an allergy to CHG or antibacterial soaps.  If your skin becomes reddened/irritated stop using the CHG and inform your nurse when you arrive at Short Stay. Do not shave (including legs and underarms) for at least 48 hours prior to the first CHG shower.  You may shave your face/neck.  Please follow these instructions carefully:  1.  Shower with CHG Soap the night before surgery and the  morning of surgery.  2.  If you choose to wash your hair, wash your hair first as usual with your normal  shampoo.  3.  After you shampoo, rinse your hair and body thoroughly to remove the shampoo.                             4.  Use CHG as you would any other liquid soap.  You can apply chg directly to the skin and wash.  Gently with a scrungie or clean washcloth.  5.  Apply the CHG Soap to your body ONLY FROM THE NECK DOWN.   Do   not use on face/ open                           Wound or open sores. Avoid contact with eyes, ears mouth and   genitals (private parts).  Wash face,  Genitals (private parts) with your normal soap.             6.  Wash thoroughly, paying special attention to the area where your    surgery  will be performed.  7.  Thoroughly rinse your body with warm water from the neck down.  8.  DO NOT shower/wash with your normal soap after using and rinsing off the CHG Soap.                9.  Pat yourself dry with a clean towel.            10.  Wear clean pajamas.            11.  Place clean sheets on your bed the night of your first shower and do not  sleep with pets. Day of Surgery : Do not apply any lotions/deodorants the morning of surgery.  Please wear clean clothes to the hospital/surgery center.  FAILURE TO FOLLOW THESE INSTRUCTIONS MAY RESULT IN THE CANCELLATION OF YOUR SURGERY  PATIENT  SIGNATURE_________________________________  NURSE SIGNATURE__________________________________    Incentive Spirometer  An incentive spirometer is a tool that can help keep your lungs clear and active. This tool measures how well you are filling your lungs with each breath. Taking long deep breaths may help reverse or decrease the chance of developing breathing (pulmonary) problems (especially infection) following: A long period of time when you are unable to move or be active. BEFORE THE PROCEDURE  If the spirometer includes an indicator to show your best effort, your nurse or respiratory therapist will set it to a desired goal. If possible, sit up straight or lean slightly forward. Try not to slouch. Hold the incentive spirometer in an upright position. INSTRUCTIONS FOR USE  Sit on the edge of your bed if possible, or sit up as far as you can in bed or on a chair. Hold the incentive spirometer in an upright position. Breathe out normally. Place the mouthpiece in your mouth and seal your lips tightly around it. Breathe in slowly and as deeply as possible, raising the piston or the ball toward the top of the column. Hold your breath for 3-5 seconds or for as long as possible. Allow the piston or ball to fall to the bottom of the column. Remove the mouthpiece from your mouth and breathe out normally. Rest for a few seconds and repeat Steps 1 through 7 at least 10 times every 1-2 hours when you are awake. Take your time and take a few normal breaths between deep breaths. The spirometer may include an indicator to show your best effort. Use the indicator as a goal to work toward during each repetition. After each set of 10 deep breaths, practice coughing to be sure your lungs are clear. If you have an incision (the cut made at the time of surgery), support your incision when coughing by placing a pillow or rolled up towels firmly against it. Once you are able to get out of bed, walk around  indoors and cough well. You may stop using the incentive spirometer when instructed by your caregiver.  RISKS AND COMPLICATIONS Take your time so you do not get dizzy or light-headed. If you are in pain, you may need to take or ask for pain medication before doing incentive spirometry. It is harder to take a deep breath if you are having pain. AFTER USE Rest and breathe slowly and easily. It can be helpful to  keep track of a log of your progress. Your caregiver can provide you with a simple table to help with this. If you are using the spirometer at home, follow these instructions: [place] IF:  You are having difficultly using the spirometer. You have trouble using the spirometer as often as instructed. Your pain medication is not giving enough relief while using the spirometer. You develop fever of 100.5 F (38.1 C) or higher. SEEK IMMEDIATE MEDICAL CARE IF:  You cough up bloody sputum that had not been present before. You develop fever of 102 F (38.9 C) or greater. You develop worsening pain at or near the incision site. MAKE SURE YOU:  Understand these instructions. Will watch your condition. Will get help right away if you are not doing well or get worse. Document Released: 12/01/2006 Document Revised: 10/13/2011 Document Reviewed: 02/01/2007 [facility] Patient Information 2014 [employer], [place].   ________________________________________________________________________  ________________________________________________________________________

## 2022-05-03 NOTE — H&P (Addendum)
KNEE ARTHROPLASTY ADMISSION H&P  Patient ID: Craig Mccann MRN: [id number] DOB/AGE: September 05, 1950 71 y.o.  Chief Complaint: right knee pain.  Planned Procedure Date: 05/27/22 Medical Clearance by Pablo Lawrence NP   Cardiac Clearance by Dr. Harl Bowie   HPI: Craig Mccann is a 71 y.o. 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 The patient has a history of pain and functional disability in the right knee due to arthritis and has failed non-surgical conservative treatments for greater than 12 weeks to include NSAID's and/or analgesics, corticosteriod injections, use of assistive devices, weight reduction as appropriate, and activity modification.  Onset of symptoms was gradual, starting 2 years ago with gradually worsening course since that time. The patient noted prior procedures on the knee to include  arthroscopy and menisectomy on the right knee.  Patient currently rates pain at 10 out of 10 with activity. Patient has night pain, worsening of pain with activity and weight bearing, and pain that interferes with activities of daily living.  Patient has evidence of subchondral sclerosis, periarticular osteophytes, and joint space narrowing by imaging studies.  There is no active infection.  Past Medical History:  Diagnosis Date   Acute prostatitis 01/2015   Allergy    Basal cell carcinoma 2017   Multiple: back, left shin, left arm   CAD (coronary artery disease)    a. 07/2017: cath showing 50-60% stenosis along the LCx with a FFR of 0.94 not being hemodynamically significant   CAP (community acquired pneumonia) 06/26/2015   Chronic renal insufficiency, stage II (mild)    stage II/II: CrCl 60s.   Complication of anesthesia    COPD (chronic obstructive pulmonary disease) ([place]) 05/07/15   Long time smoker + COPD changes noted on lung ca screening CT   Diverticulitis    Encounter for screening for lung cancer    Screening CT ok 08/27/16--repeat in 1 yr recommended.   Family history  of colon cancer in mother    Hearing impairment    hearing aids   History of adenomatous polyp of colon 12/24/10   History of kidney stones    Hyperlipidemia 07/14/2016   started atorv 20 07/16/16   Kidney stones    bilat nonobstrucing renal calculi 2016, + left benign renal cysts   Melanoma ([place])    R posterolateral neck and R nasal ala.  Most recent was left mid back 01/2016 (Dr. Tarri Glenn).   Spinal headache    Tobacco dependence    CT chest for lung ca screning 04/2015 showed benign findings; 1 yr repeat recommended   Past Surgical History:  Procedure Laterality Date   [place]   COLONOSCOPY  12/24/10; 2014   Multiple TCSs: pt on 5 yr recall, next due 2019.   COLONOSCOPY N/A 10/09/2017   Procedure: COLONOSCOPY;  Surgeon: Danie Binder, MD;  [facility];  Service: Endoscopy;  Laterality: N/A;  1:30   INGUINAL HERNIA REPAIR  1984   INTRAVASCULAR PRESSURE WIRE/FFR STUDY N/A 07/14/2017   Procedure: INTRAVASCULAR PRESSURE WIRE/FFR STUDY;  Surgeon: Belva Crome, MD;  Location: [facility];  Service: Cardiovascular;  Laterality: N/A;   JOINT REPLACEMENT     left   KNEE SURGERY Left 2015   cartilage repair   KNEE SURGERY Right 1997   "          "   LEFT HEART CATH AND CORONARY ANGIOGRAPHY N/A 07/14/2017   Procedure: LEFT HEART CATH AND  CORONARY ANGIOGRAPHY;  Surgeon: Belva Crome, MD;  Location: [facility] [facility];  Service: Cardiovascular;  Laterality: N/A;   LEFT HEART CATH AND CORONARY ANGIOGRAPHY N/A 10/25/2020   Procedure: LEFT HEART CATH AND CORONARY ANGIOGRAPHY;  Surgeon: Troy Sine, MD;  Location: [facility];  Service: Cardiovascular;  Laterality: N/A;   LEFT HEART CATH AND CORONARY ANGIOGRAPHY N/A 12/12/2021   Procedure: LEFT HEART CATH AND CORONARY ANGIOGRAPHY;  Surgeon: Martinique, Peter M, MD;  Location: [facility];  Service: Cardiovascular;  Laterality: N/A;   LITHOTRIPSY     NECK SURGERY  1989   Thumb  surgery Bilateral 2005   and 2006   TOTAL HIP ARTHROPLASTY Left 11/20/2020   Procedure: TOTAL HIP ARTHROPLASTY ANTERIOR APPROACH;  Surgeon: Renette Butters, MD;  [facility];  Service: Orthopedics;  Laterality: Left;   TOTAL KNEE ARTHROPLASTY Left 04/23/2016   Procedure: TOTAL KNEE ARTHROPLASTY;  Surgeon: Ninetta Lights, MD;  Location: [facility];  Service: Orthopedics;  Laterality: Left;   Allergies  Allergen Reactions   Codeine Hives   Fentanyl     Other reaction(s): Hypotension   Lexiscan [Regadenoson] Other (See Comments)    Transient Heart Block   Valium [Diazepam] Anaphylaxis   Prior to Admission medications   Medication Sig Start Date End Date Taking? Authorizing Provider  albuterol (PROAIR HFA) 108 (90 BASE) MCG/ACT inhaler Inhale 2 puffs into the lungs every 6 (six) hours as needed for wheezing or shortness of breath. 06/26/15   McGowenAdrian Blackwater, MD  aspirin EC 81 MG tablet Take 1 tablet (81 mg total) by mouth 2 (two) times daily. For DVT prophylaxis for 30 days after surgery. Patient taking differently: Take 81 mg by mouth daily. For DVT prophylaxis for 30 days after surgery. 11/20/20   Britt Bottom, PA-C  Budeson-Glycopyrrol-Formoterol 160-9-4.8 MCG/ACT AERO Inhale 2 puffs into the lungs 2 (two) times daily. 04/03/21   [provider]  carvedilol (COREG) 12.5 MG tablet Take 1 tablet (12.5 mg total) by mouth 2 (two) times daily. 12/09/21   Evans Lance, MD  FARXIGA 10 MG TABS tablet TAKE 1 TABLET BY MOUTH DAILY BEFORE BREAKFAST 11/27/21   Arnoldo Lenis, MD  isosorbide mononitrate (IMDUR) 30 MG 24 hr tablet Take 1 tablet (30 mg total) by mouth daily. 01/30/22   Arnoldo Lenis, MD  nitroGLYCERIN (NITROSTAT) 0.4 MG SL tablet Place 1 tablet (0.4 mg total) under the tongue every 5 (five) minutes as needed for chest pain. 06/20/21   Arnoldo Lenis, MD  Omega-3 Fatty Acids (OMEGA 3 PO) Take 1 tablet by mouth See admin instructions. Take 2 tab in the morning and 1  later in the afternoon  Pro Omega LDL W / Red yeast Rice    [provider]  spironolactone (ALDACTONE) 25 MG tablet TAKE 1/2 TABLET BY MOUTH DAILY 11/27/21   Arnoldo Lenis, MD  traZODone (DESYREL) 50 MG tablet Take 50 mg by mouth at bedtime. 09/28/21   [provider]   Social History   Socioeconomic History   Marital status: Married    Spouse name: Not on file   Number of children: Not on file   Years of education: Not on file   Highest education level: Not on file  Occupational History   Not on file  Tobacco Use   Smoking status: Some Days    Packs/day: 0.50    Years: 40.00    Total pack years: 20.00  Types: Cigarettes    Start date: 08/04/1968   Smokeless tobacco: Never   Tobacco comments:    half pack a day  Vaping Use   Vaping Use: Never used  Substance and Sexual Activity   Alcohol use: No    Alcohol/week: 0.0 standard drinks of alcohol   Drug use: No   Sexual activity: Yes  Other Topics Concern   Not on file  Social History Narrative   Married, 3 daughters.   Educ: HS + 2 yr technical school.   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   Relocated to [facility] 2016.   Tob: 50 pack-yr hx; cutting back as of 01/2015.   No alc or drugs.   Social Determinants of Health   Financial Resource Strain: Not on file  Food Insecurity: Not on file  Transportation Needs: Not on file  Physical Activity: Not on file  Stress: Not on file  Social Connections: Not on file   Family History  Problem Relation Age of Onset   Colon cancer Mother    Cancer Mother    Lung cancer Father    Cancer Father    Brain cancer Brother    Cancer Brother     ROS: Currently denies lightheadedness, dizziness, Fever, chills, CP, SOB.   No personal history of DVT, PE, or CVA. + h/o 3 heart attacks No loose teeth + partial dentures All other systems have been reviewed and were otherwise currently negative with the exception of those mentioned in the HPI and as  above.  Objective: Vitals: Ht: 6' Wt: 209.4 lbs Temp: 97.6 BP: 117/79 Pulse: 80 O2 95% on room air.   Physical Exam: General: Alert, NAD.  Antalgic Gait  HEENT: EOMI, Good Neck Extension  Pulm: No increased work of breathing.  Clear B/L A/P w/o crackle or wheeze.  CV: RRR, No m/g/r appreciated  GI: soft, NT, ND. BS x 4 quadrants Neuro: CN II-XII grossly intact without focal deficit.  Sensation intact distally Skin: No lesions in the area of chief complaint MSK/Surgical Site:  + JLT. ROM 0-105.  Good strength in extension and flexion.  +EHL/FHL.  NVI.  Stable with varus and valgus stress.    Imaging Review Plain radiographs demonstrate moderate degenerative joint disease of the right knee.   The overall alignment is mild varus. The bone quality appears to be fair for age and reported activity level.  Preoperative templating of the joint replacement has been completed, documented, and submitted to the Operating Room personnel in order to optimize intra-operative equipment management.  Assessment: OA RIGHT KNEE Active Problems:   * No active hospital problems. *   Plan: Plan for Procedure(s): TOTAL KNEE ARTHROPLASTY  The patient history, physical exam, clinical judgement of the provider and imaging are consistent with end stage degenerative joint disease and total joint arthroplasty is deemed medically necessary. The treatment options including medical management, injection therapy, and arthroplasty were discussed at length. The risks and benefits of Procedure(s): TOTAL KNEE ARTHROPLASTY were presented and reviewed.  The risks of nonoperative treatment, versus surgical intervention including but not limited to continued pain, aseptic loosening, stiffness, dislocation/subluxation, infection, bleeding, nerve injury, blood clots, cardiopulmonary complications, morbidity, mortality, among others were discussed. The patient verbalizes understanding and wishes to proceed with the plan.   Patient is being admitted for inpatient treatment for surgery, pain control, PT, prophylactic antibiotics, VTE prophylaxis, progressive ambulation, ADL's and discharge planning. He will spend the night in observation.  Dental prophylaxis discussed and recommended for 2  years postoperatively.  The patient does meet the criteria for TXA which will be used perioperatively.   ASA 81 mg BID will be used postoperatively for DVT prophylaxis in addition to SCDs, and early ambulation. Plan for Tylenol, Mobic, oxycodone for pain.   Robaxin for muscle spasms.   Zofran for nausea and vomiting. Colace for constipation prevention. Pharmacy- [facility] The patient is planning to be discharged home with OPPT and into the care of his wife Neoma Laming who can be reached at [phone] Follow up appt 06/11/22 at 4:15pm     Alisa Graff Office [phone] 05/03/2022 8:03 AM

## 2022-05-05 DIAGNOSIS — M25561 Pain in right knee: Secondary | ICD-10-CM | POA: Diagnosis not present

## 2022-05-10 IMAGING — RF DG HIP (WITH PELVIS) OPERATIVE*L*
1 series · 4 of 4 positions shown · non-contrast
Comparison: No recent.

CLINICAL DATA: Total left hip replacement.

EXAM:
OPERATIVE LEFT HIP (WITH PELVIS IF PERFORMED) 4 VIEWS
TECHNIQUE: Fluoroscopic spot image(s) were submitted for interpretation
post-operatively.

[Series 1: run · 4 of 4 slices shown]
[im 1/4]
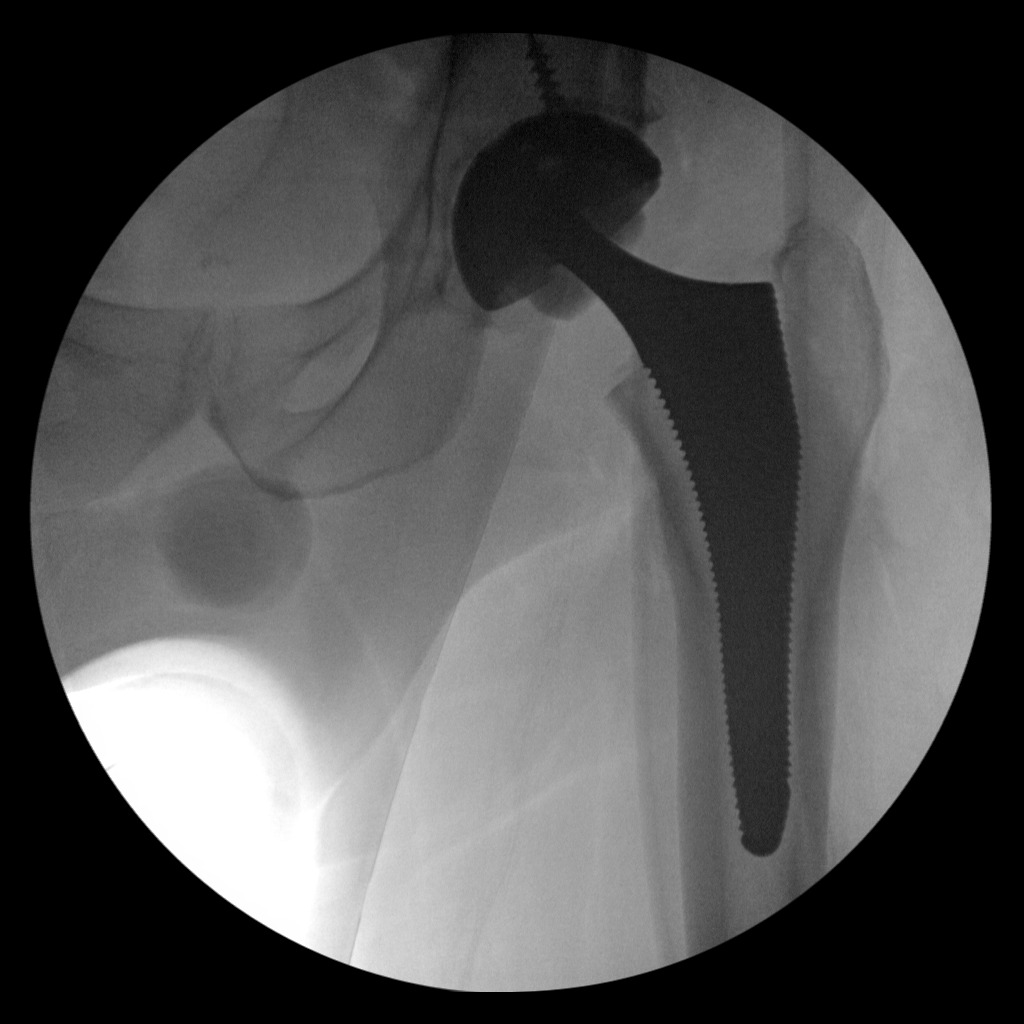
[im 2/4]
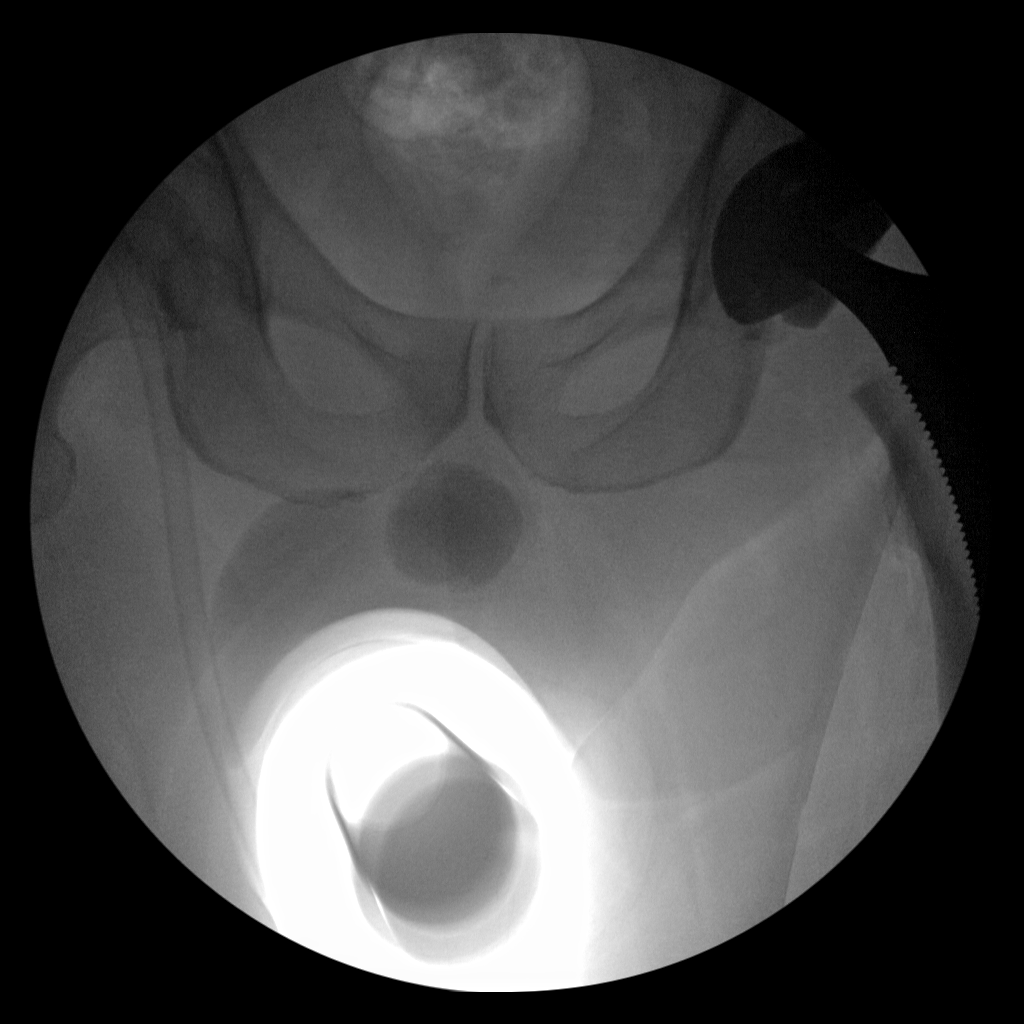
[im 3/4]
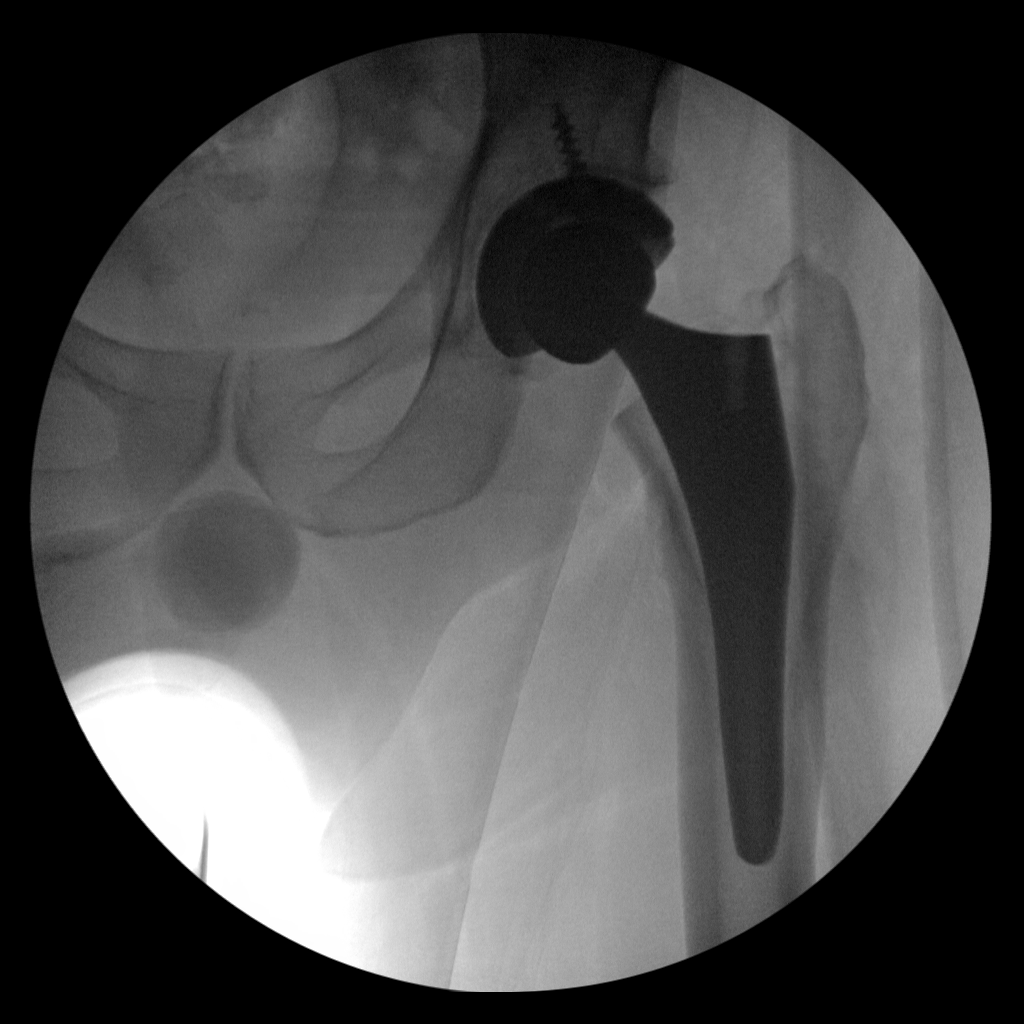
[im 4/4]
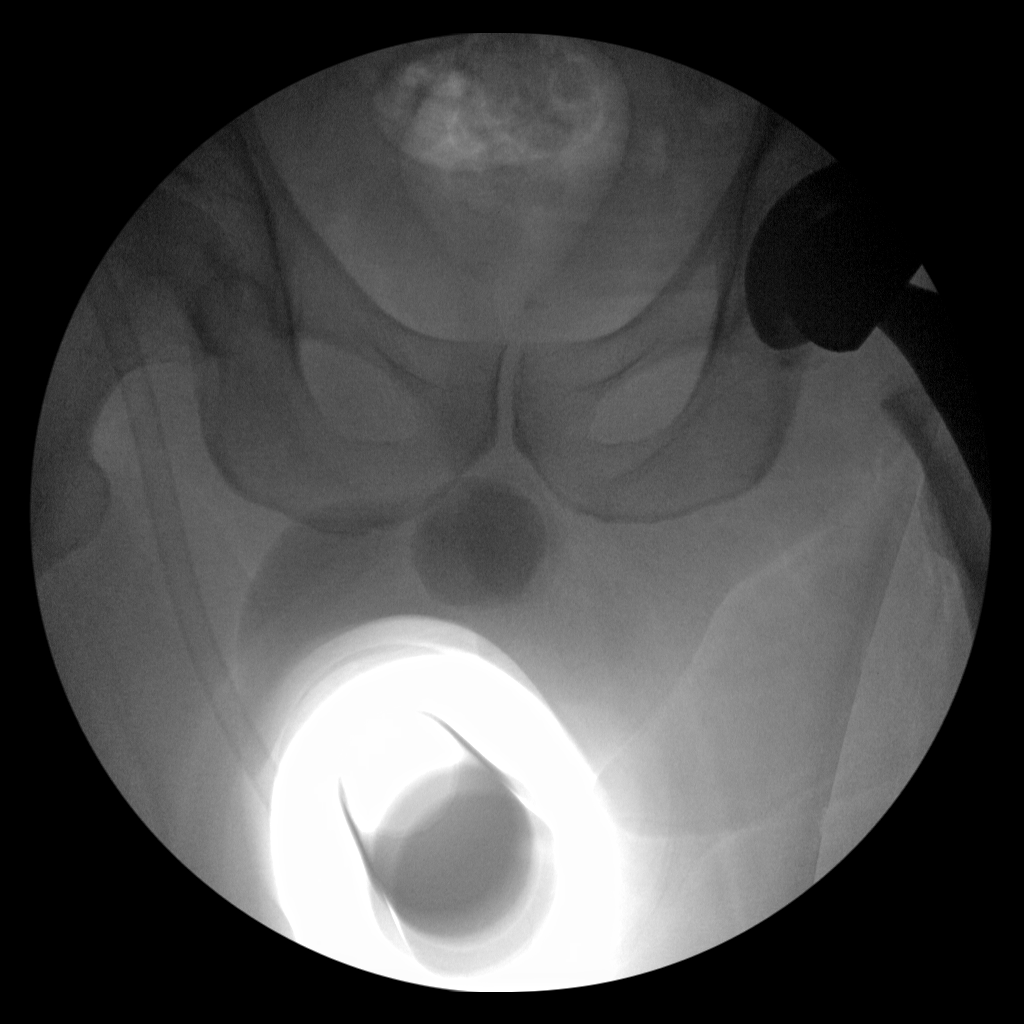

[4 of 4 positions shown; findings below may reference images not displayed]

FINDINGS: Total left hip replacement with anatomic alignment. Hardware intact.
0 minutes 17 seconds fluoroscopy. Four images obtained.
IMPRESSION: Total left hip replacement with anatomic alignment.

## 2022-05-10 IMAGING — RF DG C-ARM 1-60 MIN-NO REPORT
1 series · 4 of 4 positions shown · non-contrast
Comparison: No recent.

CLINICAL DATA: Total left hip replacement.

EXAM:
OPERATIVE LEFT HIP (WITH PELVIS IF PERFORMED) 4 VIEWS
TECHNIQUE: Fluoroscopic spot image(s) were submitted for interpretation
post-operatively.

[Series 1: run · 4 of 4 slices shown]
[im 1/4]
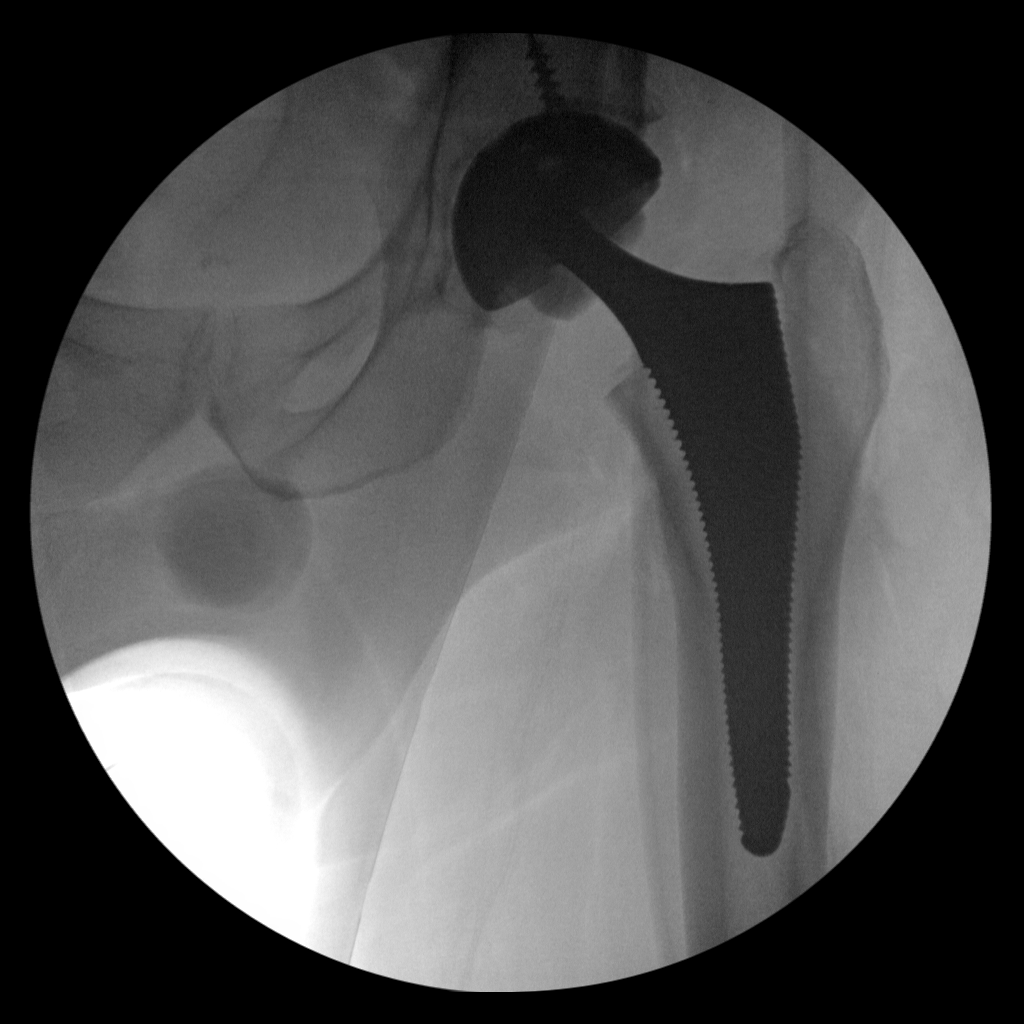
[im 2/4]
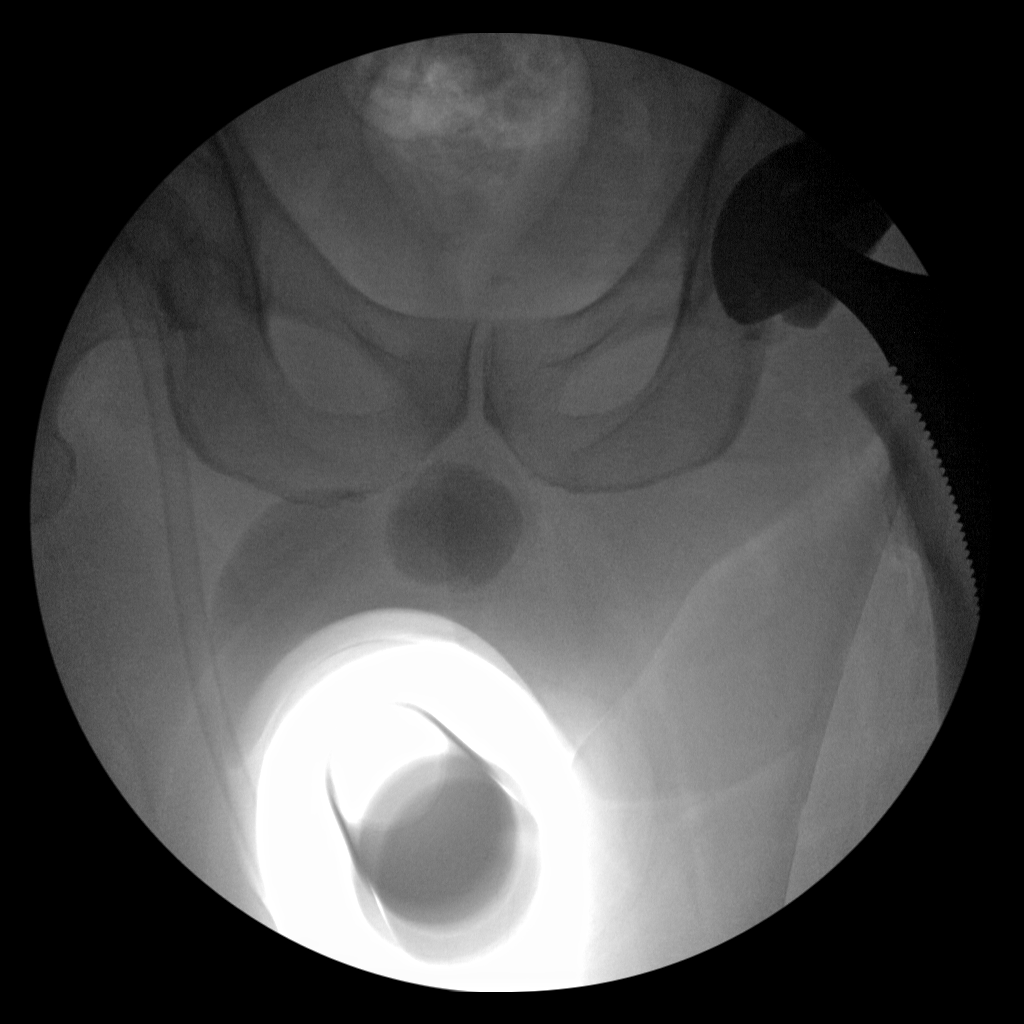
[im 3/4]
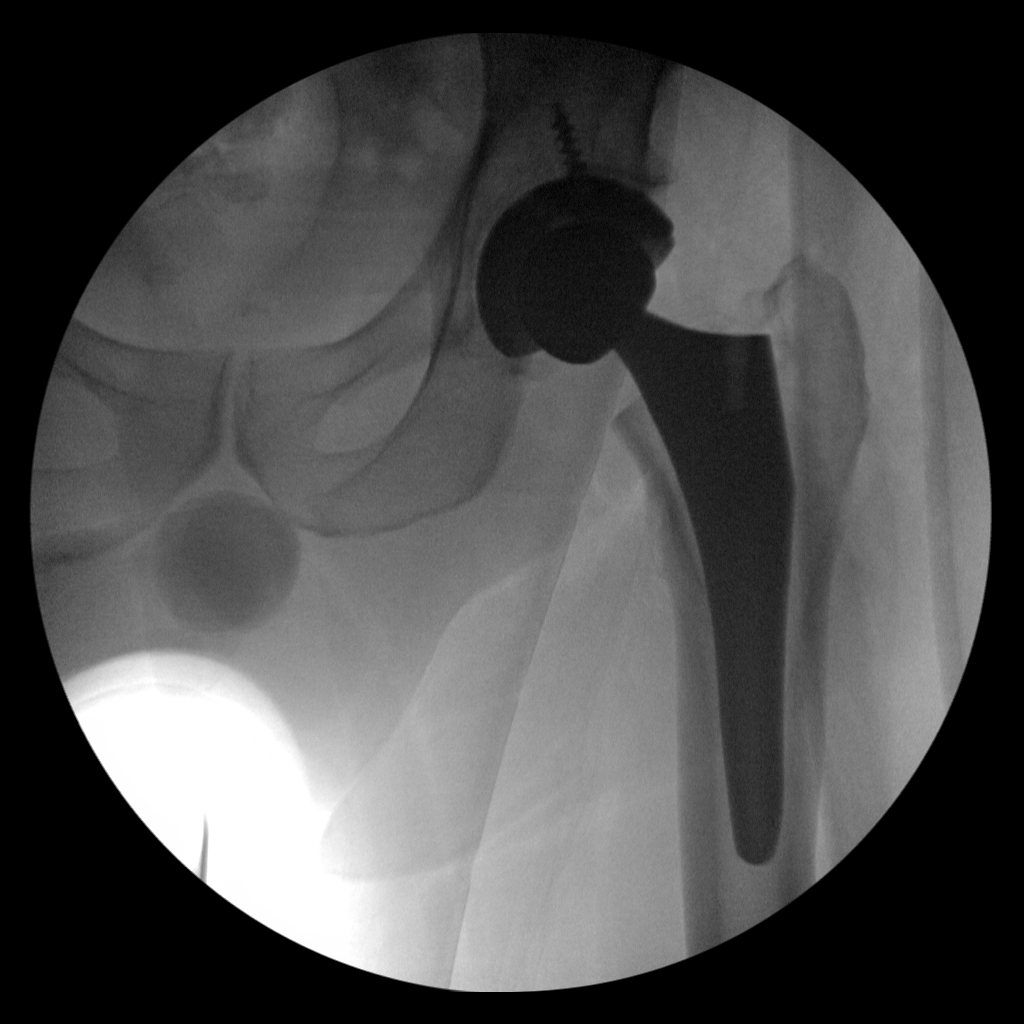
[im 4/4]
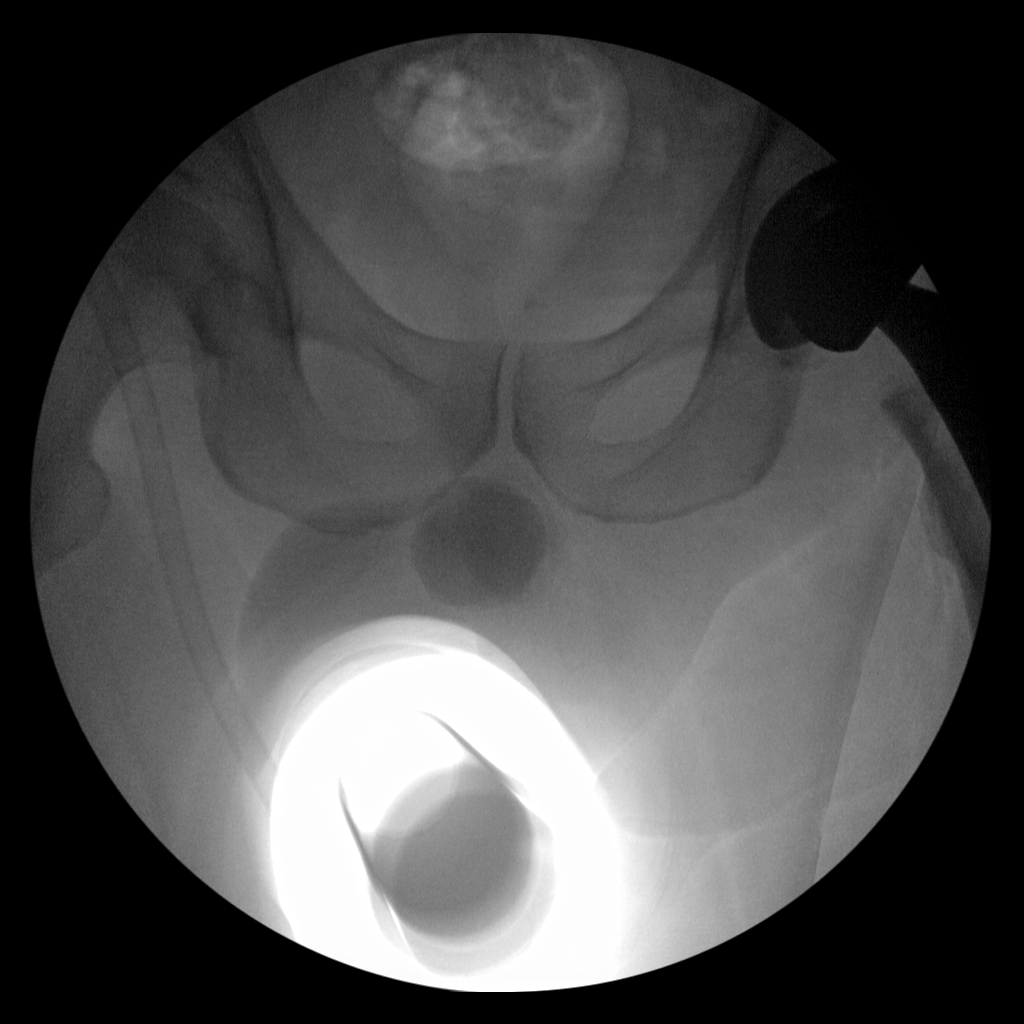

[4 of 4 positions shown; findings below may reference images not displayed]

FINDINGS: Total left hip replacement with anatomic alignment. Hardware intact.
0 minutes 17 seconds fluoroscopy. Four images obtained.
IMPRESSION: Total left hip replacement with anatomic alignment.

## 2022-05-13 NOTE — Progress Notes (Signed)
COVID Vaccine Completed:  Date of COVID positive in last 90 days:  PCP - Pablo Lawrence, NP Cardiologist - Carlyle Dolly, MD  Cardiac clearance in Epic dated 01-30-22 by Dr. Harl Bowie  Chest x-ray -  EKG - 12-09-21 Epic Stress Test - 10-12-20 Epic ECHO - 10-30-21 Epic Cardiac Cath - 12-12-21 Epic Pacemaker/ICD device last checked: Spinal Cord Stimulator:  Bowel Prep -   Sleep Study -  CPAP -   Fasting Blood Sugar -  Checks Blood Sugar _____ times a day  Blood Thinner Instructions: Aspirin Instructions: ASA 81 Last Dose:  Activity level:  Can go up a flight of stairs and perform activities of daily living without stopping and without symptoms of chest pain or shortness of breath.  Able to exercise without symptoms  Unable to go up a flight of stairs without symptoms of     Anesthesia review:  CAD, cardiomyopathy, CHF, COPD  Patient denies shortness of breath, fever, cough and chest pain at PAT appointment  Patient verbalized understanding of instructions that were given to them at the PAT appointment. Patient was also instructed that they will need to review over the PAT instructions again at home before surgery.

## 2022-05-14 ENCOUNTER — Other Ambulatory Visit: Payer: Self-pay

## 2022-05-14 ENCOUNTER — Encounter (HOSPITAL_COMMUNITY)
Admission: RE | Admit: 2022-05-14 | Discharge: 2022-05-14 | Disposition: A | Discharge status: Home / Self Care | Payer: Medicare HMO | Source: Ambulatory Visit | Attending: Orthopedic Surgery | Admitting: Orthopedic Surgery

## 2022-05-14 ENCOUNTER — Encounter (HOSPITAL_COMMUNITY): Payer: Self-pay

## 2022-05-14 VITALS — BP 122/86 | HR 73 | Temp 97.8°F | Resp 20 | Ht 73.0 in | Wt 210.8 lb

## 2022-05-14 DIAGNOSIS — I2510 Atherosclerotic heart disease of native coronary artery without angina pectoris: Secondary | ICD-10-CM | POA: Insufficient documentation

## 2022-05-14 DIAGNOSIS — Z01818 Encounter for other preprocedural examination: Secondary | ICD-10-CM | POA: Insufficient documentation

## 2022-05-14 HISTORY — DX: Unspecified osteoarthritis, unspecified site: M19.90

## 2022-05-14 LAB — CBC
HCT: 41.5 % (ref 39.0–52.0)
Hemoglobin: 13.7 g/dL (ref 13.0–17.0)
MCH: 31.1 pg (ref 26.0–34.0)
MCHC: 33 g/dL (ref 30.0–36.0)
MCV: 94.1 fL (ref 80.0–100.0)
Platelets: 252 10*3/uL (ref 150–400)
RBC: 4.41 MIL/uL (ref 4.22–5.81)
RDW: 13.9 % (ref 11.5–15.5)
WBC: 6.3 10*3/uL (ref 4.0–10.5)
nRBC: 0 % (ref 0.0–0.2)

## 2022-05-14 LAB — BASIC METABOLIC PANEL
Anion gap: 4 — ABNORMAL LOW (ref 5–15)
BUN: 26 mg/dL — ABNORMAL HIGH (ref 8–23)
CO2: 27 mmol/L (ref 22–32)
Calcium: 9.3 mg/dL (ref 8.9–10.3)
Chloride: 109 mmol/L (ref 98–111)
Creatinine, Ser: 1.63 mg/dL — ABNORMAL HIGH (ref 0.61–1.24)
GFR, Estimated: 44 mL/min — ABNORMAL LOW (ref >60)
Glucose, Bld: 80 mg/dL (ref 70–99)
Potassium: 4 mmol/L (ref 3.5–5.1)
Sodium: 140 mmol/L (ref 135–145)

## 2022-05-14 LAB — SURGICAL PCR SCREEN
MRSA, PCR: NEGATIVE
Staphylococcus aureus: POSITIVE — AB

## 2022-05-14 NOTE — Progress Notes (Signed)
PCR results sent to Dr. Percell Miller to review.

## 2022-05-14 NOTE — Progress Notes (Signed)
Patient in for PAT appointment, posting sheet and orders state L knee replacement but patient states that he is having R knee done.  Left knee has already been replaced.  Left a message for Claiborne Billings at Dr. Debroah Loop office, consent will be signed day of surgery.

## 2022-05-15 NOTE — Progress Notes (Signed)
Anesthesia Chart Review   Case: [id number] Date/Time: 05/27/22 0715   Procedure: TOTAL KNEE ARTHROPLASTY (Right: Knee)   Anesthesia type: Spinal   Pre-op diagnosis: OA RIGHT KNEE   [facility] 08 / [facility]   Surgeons: Renette Butters, MD       DISCUSSION:71 y.o. smoker with h/o COPD, non-ischemic CM with EF 30-35%, CKD, right knee OA scheduled for above procedure 05/27/2022 with Dr. Edmonia Lynch.   Pt last seen by cardiology 01/30/2022. Per OV note, "ok from cardiac standpoint to proceed with knee replacement"  He has been evaluated by EP for ICD insertion.  Per Dr. Lovena Le, "Ok to proceed with knee surgery. He is an acceptable risk in light of his heart cath. ICD insertion can be done several weeks after the knee surgery."  Anticipate pt can proceed with planned procedure barring acute status change.   VS: BP 122/86   Pulse 73   Temp 36.6 C (Oral)   Resp 20   Ht '6\' 1"'$  (1.854 m)   Wt 95.6 kg   SpO2 99%   BMI 27.81 kg/m   PROVIDERS: Pablo Lawrence, NP  Cardiologist - Carlyle Dolly, MD  Cristopher Peru, MD is EP LABS: Labs reviewed: Acceptable for surgery. (all labs ordered are listed, but only abnormal results are displayed)  Labs Reviewed  SURGICAL PCR SCREEN - Abnormal; Notable for the following components:      Result Value   Staphylococcus aureus POSITIVE (*)    All other components within normal limits  BASIC METABOLIC PANEL - Abnormal; Notable for the following components:   BUN 26 (*)    Creatinine, Ser 1.63 (*)    GFR, Estimated 44 (*)    Anion gap 4 (*)    All other components within normal limits  CBC     IMAGES:   EKG:   CV: Cardiac Cath 12/12/2021   Dist RCA lesion is 30% stenosed.   Mid RCA lesion is 15% stenosed.   Ramus lesion is 25% stenosed.   Ost 1st Diag lesion is 20% stenosed.   Prox Cx to Mid Cx lesion is 50% stenosed.   LV end diastolic pressure is normal.   Nonobstructive CAD. If anything the lesion in the mid LCx is  improved from prior. FFR of this lesion in 2018 was normal.  Normal LVEDP   Plan: I don't see anything from a coronary standpoint that would explain his chest pain symptoms particularly at rest. Would consider noncardiac causes of pain. Continue medical management.  Echo 10/30/2021 1. Left ventricular ejection fraction, by estimation, is 30 to 35%. The  left ventricle has moderately decreased function. The left ventricle  demonstrates global hypokinesis. Left ventricular diastolic parameters are  indeterminate. The average left  ventricular global longitudinal strain is -6.8 %. The global longitudinal  strain is abnormal.   2. Right ventricular systolic function is normal. The right ventricular  size is normal. Tricuspid regurgitation signal is inadequate for assessing  PA pressure.   3. The mitral valve is normal in structure. No evidence of mitral valve  regurgitation. No evidence of mitral stenosis.   4. The tricuspid valve is abnormal.   5. The aortic valve is tricuspid. There is mild calcification of the  aortic valve. There is mild thickening of the aortic valve. Aortic valve  regurgitation is not visualized. No aortic stenosis is present.   6. Aortic dilatation noted. There is mild dilatation of the ascending  aorta, measuring 42 mm. There is moderate  dilatation of the descending  aorta, measuring 44 mm. Consider CTA to better evaluate extent of aortic  dilatation   7. The inferior vena cava is normal in size with greater than 50%  respiratory variability, suggesting right atrial pressure of 3 mmHg.  Past Medical History:  Diagnosis Date   Acute prostatitis 01/2015   Allergy    Arthritis    Basal cell carcinoma 2017   Multiple: back, left shin, left arm   CAD (coronary artery disease)    a. 07/2017: cath showing 50-60% stenosis along the LCx with a FFR of 0.94 not being hemodynamically significant   CAP (community acquired pneumonia) 06/26/2015   Chronic renal  insufficiency, stage II (mild)    stage II/II: CrCl 60s.   Complication of anesthesia    COPD (chronic obstructive pulmonary disease) ([place]) 05/07/2015   Long time smoker + COPD changes noted on lung ca screening CT   Diverticulitis    Encounter for screening for lung cancer    Screening CT ok 08/27/16--repeat in 1 yr recommended.   Family history of colon cancer in mother    Hearing impairment    hearing aids   History of adenomatous polyp of colon 12/24/2010   History of kidney stones    Hyperlipidemia 07/14/2016   started atorv 20 07/16/16   Kidney stones    bilat nonobstrucing renal calculi 2016, + left benign renal cysts   Melanoma ([place])    R posterolateral neck and R nasal ala.  Most recent was left mid back 01/2016 (Dr. Tarri Glenn).   Spinal headache    Tobacco dependence    CT chest for lung ca screning 04/2015 showed benign findings; 1 yr repeat recommended    Past Surgical History:  Procedure Laterality Date   [place]   COLONOSCOPY  12/24/10; 2014   Multiple TCSs: pt on 5 yr recall, next due 2019.   COLONOSCOPY N/A 10/09/2017   Procedure: COLONOSCOPY;  Surgeon: Danie Binder, MD;  [facility];  Service: Endoscopy;  Laterality: N/A;  1:30   INGUINAL HERNIA REPAIR  1984   INTRAVASCULAR PRESSURE WIRE/FFR STUDY N/A 07/14/2017   Procedure: INTRAVASCULAR PRESSURE WIRE/FFR STUDY;  Surgeon: Belva Crome, MD;  Location: [place] [facility];  Service: Cardiovascular;  Laterality: N/A;   JOINT REPLACEMENT     left   KNEE SURGERY Left 2015   cartilage repair   KNEE SURGERY Right 1997   "          "   LEFT HEART CATH AND CORONARY ANGIOGRAPHY N/A 07/14/2017   Procedure: LEFT HEART CATH AND CORONARY ANGIOGRAPHY;  Surgeon: Belva Crome, MD;  Location: [place] [facility];  Service: Cardiovascular;  Laterality: N/A;   LEFT HEART CATH AND CORONARY ANGIOGRAPHY N/A 10/25/2020   Procedure: LEFT HEART CATH AND CORONARY ANGIOGRAPHY;  Surgeon:  Troy Sine, MD;  Location: [place] [facility] [place] [facility];  Service: Cardiovascular;  Laterality: N/A;   LEFT HEART CATH AND CORONARY ANGIOGRAPHY N/A 12/12/2021   Procedure: LEFT HEART CATH AND CORONARY ANGIOGRAPHY;  Surgeon: Martinique, Peter M, MD;  Location: [place] [facility];  Service: Cardiovascular;  Laterality: N/A;   LITHOTRIPSY     NECK SURGERY  1989   Thumb surgery Bilateral 2005   and 2006   TOTAL HIP ARTHROPLASTY Left 11/20/2020   Procedure: TOTAL HIP ARTHROPLASTY ANTERIOR APPROACH;  Surgeon: Renette Butters, MD;  [facility];  Service: Orthopedics;  Laterality:  Left;   TOTAL KNEE ARTHROPLASTY Left 04/23/2016   Procedure: TOTAL KNEE ARTHROPLASTY;  Surgeon: Ninetta Lights, MD;  Location: [place];  Service: Orthopedics;  Laterality: Left;    MEDICATIONS:  albuterol (PROAIR HFA) 108 (90 BASE) MCG/ACT inhaler   aspirin EC 81 MG tablet   Budeson-Glycopyrrol-Formoterol 160-9-4.8 MCG/ACT AERO   carvedilol (COREG) 12.5 MG tablet   FARXIGA 10 MG TABS tablet   isosorbide mononitrate (IMDUR) 30 MG 24 hr tablet   nitroGLYCERIN (NITROSTAT) 0.4 MG SL tablet   Omega-3 Fatty Acids (OMEGA 3 PO)   spironolactone (ALDACTONE) 25 MG tablet   Tetrahydrozoline HCl (VISINE OP)   traZODone (DESYREL) 50 MG tablet   No current facility-administered medications for this encounter.    Konrad Felix Ward, PA-C [facility] [phone]

## 2022-05-21 NOTE — Care Plan (Signed)
Ortho Bundle Case Management Note  Patient Details  Name: Craig Mccann MRN: [id number] Date of Birth: Mar 01, 1951      met with patient in the office for follow up. will discharge to home with family to assist. has rolling walker for home use. OPPT set up with [facility]. discharge instructions discussed and questions answered. appointments confirmed. MD and patient in agreement with plan. Choice offered                DME Arranged:    DME Agency:     HH Arranged:    HH Agency:     Additional Comments: Please contact me with any questions of if this plan should need to change.  Ladell Heads,  [place] [facility]  [phone] 05/21/2022, 9:08 AM

## 2022-05-26 NOTE — Anesthesia Preprocedure Evaluation (Addendum)
Anesthesia Evaluation  Patient identified by MRN, date of birth, ID band Patient awake    Reviewed: Allergy & Precautions, NPO status , Patient's Chart, lab work & pertinent test results, reviewed documented beta blocker date and time   History of Anesthesia Complications (+) POST - OP SPINAL HEADACHE and history of anesthetic complications  Airway Mallampati: II  TM Distance: >3 FB Neck ROM: Full    Dental  (+) Missing, Edentulous Upper,    Pulmonary COPD, Current Smoker,    Pulmonary exam normal        Cardiovascular hypertension, Pt. on medications and Pt. on home beta blockers + CAD  Normal cardiovascular exam  Echo 10/30/2021: EF 30-35%, global hypokinesis, mild dilatation of ascending aorta measuring 13m, moderate dilatation of descending aorta measuring 482m   Neuro/Psych negative neurological ROS  negative psych ROS   GI/Hepatic negative GI ROS, Neg liver ROS,   Endo/Other  negative endocrine ROS  Renal/GU Renal InsufficiencyRenal disease (Cr 1.63)  negative genitourinary   Musculoskeletal  (+) Arthritis ,   Abdominal   Peds  Hematology negative hematology ROS (+)   Anesthesia Other Findings Day of surgery medications reviewed with patient.  Reproductive/Obstetrics negative OB ROS                            Anesthesia Physical Anesthesia Plan  ASA: 3  Anesthesia Plan: General   Post-op Pain Management: Tylenol PO (pre-op)*   Induction: Intravenous  PONV Risk Score and Plan: 2 and Treatment may vary due to age or medical condition, Ondansetron and Dexamethasone  Airway Management Planned: Oral ETT  Additional Equipment: None  Intra-op Plan:   Post-operative Plan: Extubation in OR  Informed Consent: I have reviewed the patients History and Physical, chart, labs and discussed the procedure including the risks, benefits and alternatives for the proposed anesthesia with  the patient or authorized representative who has indicated his/her understanding and acceptance.     Dental advisory given  Plan Discussed with: CRNA  Anesthesia Plan Comments:        Anesthesia Quick Evaluation

## 2022-05-27 ENCOUNTER — Encounter (HOSPITAL_COMMUNITY): Payer: Self-pay | Admitting: Orthopedic Surgery

## 2022-05-27 ENCOUNTER — Ambulatory Visit (HOSPITAL_COMMUNITY): Payer: Medicare HMO | Admitting: Physician Assistant

## 2022-05-27 ENCOUNTER — Ambulatory Visit (HOSPITAL_COMMUNITY): Payer: Medicare HMO

## 2022-05-27 ENCOUNTER — Ambulatory Visit (HOSPITAL_BASED_OUTPATIENT_CLINIC_OR_DEPARTMENT_OTHER): Payer: Medicare HMO | Admitting: Anesthesiology

## 2022-05-27 ENCOUNTER — Other Ambulatory Visit: Payer: Self-pay

## 2022-05-27 ENCOUNTER — Ambulatory Visit (HOSPITAL_COMMUNITY)
Admission: RE | Admit: 2022-05-27 | Discharge: 2022-05-27 | Disposition: A | Discharge status: Home / Self Care | Payer: Medicare HMO | Attending: Orthopedic Surgery | Admitting: Orthopedic Surgery

## 2022-05-27 ENCOUNTER — Encounter (HOSPITAL_COMMUNITY)
Admission: RE | Disposition: A | Discharge status: Home / Self Care | Payer: Self-pay | Source: Home / Self Care | Attending: Orthopedic Surgery

## 2022-05-27 DIAGNOSIS — N182 Chronic kidney disease, stage 2 (mild): Secondary | ICD-10-CM | POA: Diagnosis not present

## 2022-05-27 DIAGNOSIS — M1711 Unilateral primary osteoarthritis, right knee: Secondary | ICD-10-CM | POA: Diagnosis not present

## 2022-05-27 DIAGNOSIS — J449 Chronic obstructive pulmonary disease, unspecified: Secondary | ICD-10-CM | POA: Diagnosis not present

## 2022-05-27 DIAGNOSIS — I2510 Atherosclerotic heart disease of native coronary artery without angina pectoris: Secondary | ICD-10-CM | POA: Insufficient documentation

## 2022-05-27 DIAGNOSIS — Z96651 Presence of right artificial knee joint: Secondary | ICD-10-CM

## 2022-05-27 DIAGNOSIS — F17210 Nicotine dependence, cigarettes, uncomplicated: Secondary | ICD-10-CM | POA: Insufficient documentation

## 2022-05-27 DIAGNOSIS — E1122 Type 2 diabetes mellitus with diabetic chronic kidney disease: Secondary | ICD-10-CM | POA: Diagnosis not present

## 2022-05-27 DIAGNOSIS — Z471 Aftercare following joint replacement surgery: Secondary | ICD-10-CM | POA: Diagnosis not present

## 2022-05-27 DIAGNOSIS — R69 Illness, unspecified: Secondary | ICD-10-CM | POA: Diagnosis not present

## 2022-05-27 DIAGNOSIS — G8918 Other acute postprocedural pain: Secondary | ICD-10-CM | POA: Diagnosis not present

## 2022-05-27 DIAGNOSIS — I129 Hypertensive chronic kidney disease with stage 1 through stage 4 chronic kidney disease, or unspecified chronic kidney disease: Secondary | ICD-10-CM | POA: Diagnosis not present

## 2022-05-27 DIAGNOSIS — I10 Essential (primary) hypertension: Secondary | ICD-10-CM

## 2022-05-27 HISTORY — PX: TOTAL KNEE ARTHROPLASTY: SHX125

## 2022-05-27 SURGERY — ARTHROPLASTY, KNEE, TOTAL
Anesthesia: General | Site: Knee | Laterality: Right

## 2022-05-27 MED ORDER — AMISULPRIDE (ANTIEMETIC) 5 MG/2ML IV SOLN: 10.0000 mg | Freq: Once | INTRAVENOUS | Status: DC | PRN

## 2022-05-27 MED ORDER — PHENYLEPHRINE HCL (PRESSORS) 10 MG/ML IV SOLN
INTRAVENOUS | Status: AC
  Filled 2022-05-27: qty 1

## 2022-05-27 MED ORDER — LIDOCAINE HCL (PF) 2 % IJ SOLN
INTRAMUSCULAR | Status: AC
  Filled 2022-05-27: qty 5

## 2022-05-27 MED ORDER — OXYCODONE HCL 5 MG PO TABS: 5.0000 mg | ORAL_TABLET | Freq: Four times a day (QID) | ORAL | 0 refills | Status: DC | PRN

## 2022-05-27 MED ORDER — MELOXICAM 15 MG PO TABS: 15.0000 mg | ORAL_TABLET | Freq: Every day | ORAL | 0 refills | Status: DC | PRN

## 2022-05-27 MED ORDER — BUPIVACAINE-EPINEPHRINE 0.25% -1:200000 IJ SOLN
INTRAMUSCULAR | Status: DC | PRN
  Administered 2022-05-27: 30 mL

## 2022-05-27 MED ORDER — FENTANYL CITRATE (PF) 100 MCG/2ML IJ SOLN
INTRAMUSCULAR | Status: AC
  Filled 2022-05-27: qty 2

## 2022-05-27 MED ORDER — METHOCARBAMOL 500 MG PO TABS: 500.0000 mg | ORAL_TABLET | Freq: Four times a day (QID) | ORAL | Status: DC | PRN

## 2022-05-27 MED ORDER — DOCUSATE SODIUM 100 MG PO CAPS: 100.0000 mg | ORAL_CAPSULE | Freq: Two times a day (BID) | ORAL | 0 refills | Status: DC | PRN

## 2022-05-27 MED ORDER — SODIUM CHLORIDE 0.9 % IR SOLN
Status: DC | PRN
  Administered 2022-05-27: 1000 mL

## 2022-05-27 MED ORDER — BUPIVACAINE LIPOSOME 1.3 % IJ SUSP
INTRAMUSCULAR | Status: DC | PRN
  Administered 2022-05-27: 20 mL

## 2022-05-27 MED ORDER — ROCURONIUM BROMIDE 100 MG/10ML IV SOLN
INTRAVENOUS | Status: DC | PRN
  Administered 2022-05-27: 60 mg via INTRAVENOUS

## 2022-05-27 MED ORDER — FENTANYL CITRATE (PF) 100 MCG/2ML IJ SOLN
INTRAMUSCULAR | Status: DC | PRN
  Administered 2022-05-27 (×5): 50 ug via INTRAVENOUS

## 2022-05-27 MED ORDER — DEXAMETHASONE SODIUM PHOSPHATE 10 MG/ML IJ SOLN
8.0000 mg | Freq: Once | INTRAMUSCULAR | Status: AC
  Administered 2022-05-27: 8 mg via INTRAVENOUS

## 2022-05-27 MED ORDER — ACETAMINOPHEN 500 MG PO TABS: 1000.0000 mg | ORAL_TABLET | Freq: Four times a day (QID) | ORAL | 0 refills | Status: DC | PRN

## 2022-05-27 MED ORDER — ONDANSETRON HCL 4 MG/2ML IJ SOLN
INTRAMUSCULAR | Status: DC | PRN
  Administered 2022-05-27: 4 mg via INTRAVENOUS

## 2022-05-27 MED ORDER — EPHEDRINE 5 MG/ML INJ
INTRAVENOUS | Status: AC
  Filled 2022-05-27: qty 5

## 2022-05-27 MED ORDER — PROPOFOL 10 MG/ML IV BOLUS
INTRAVENOUS | Status: AC
  Filled 2022-05-27: qty 20

## 2022-05-27 MED ORDER — LACTATED RINGERS IV BOLUS: 250.0000 mL | Freq: Once | INTRAVENOUS | Status: DC

## 2022-05-27 MED ORDER — ACETAMINOPHEN 500 MG PO TABS
1000.0000 mg | ORAL_TABLET | Freq: Four times a day (QID) | ORAL | Status: DC
  Administered 2022-05-27: 1000 mg via ORAL

## 2022-05-27 MED ORDER — METHOCARBAMOL 500 MG IVPB - SIMPLE MED
INTRAVENOUS | Status: AC
  Filled 2022-05-27: qty 55

## 2022-05-27 MED ORDER — BUPIVACAINE LIPOSOME 1.3 % IJ SUSP: 20.0000 mL | Freq: Once | INTRAMUSCULAR | Status: DC

## 2022-05-27 MED ORDER — PROPOFOL 10 MG/ML IV BOLUS
INTRAVENOUS | Status: DC | PRN
  Administered 2022-05-27: 150 mg via INTRAVENOUS

## 2022-05-27 MED ORDER — DEXAMETHASONE SODIUM PHOSPHATE 10 MG/ML IJ SOLN: 10.0000 mg | Freq: Once | INTRAMUSCULAR | Status: DC

## 2022-05-27 MED ORDER — OXYCODONE HCL 5 MG/5ML PO SOLN: 5.0000 mg | Freq: Once | ORAL | Status: DC | PRN

## 2022-05-27 MED ORDER — TRANEXAMIC ACID-NACL 1000-0.7 MG/100ML-% IV SOLN
1000.0000 mg | INTRAVENOUS | Status: AC
  Administered 2022-05-27: 1000 mg via INTRAVENOUS
  Filled 2022-05-27: qty 100

## 2022-05-27 MED ORDER — BUPIVACAINE-EPINEPHRINE (PF) 0.25% -1:200000 IJ SOLN
INTRAMUSCULAR | Status: AC
  Filled 2022-05-27: qty 30

## 2022-05-27 MED ORDER — LACTATED RINGERS IV BOLUS
500.0000 mL | Freq: Once | INTRAVENOUS | Status: AC
  Administered 2022-05-27: 500 mL via INTRAVENOUS

## 2022-05-27 MED ORDER — LIDOCAINE 2% (20 MG/ML) 5 ML SYRINGE
INTRAMUSCULAR | Status: DC | PRN
  Administered 2022-05-27: 100 mg via INTRAVENOUS

## 2022-05-27 MED ORDER — WATER FOR IRRIGATION, STERILE IR SOLN
Status: DC | PRN
  Administered 2022-05-27: 2000 mL

## 2022-05-27 MED ORDER — DEXAMETHASONE SODIUM PHOSPHATE 10 MG/ML IJ SOLN
INTRAMUSCULAR | Status: AC
  Filled 2022-05-27: qty 1

## 2022-05-27 MED ORDER — ACETAMINOPHEN 500 MG PO TABS: 1000.0000 mg | ORAL_TABLET | Freq: Once | ORAL | Status: DC

## 2022-05-27 MED ORDER — POVIDONE-IODINE 10 % EX SWAB
2.0000 | Freq: Once | CUTANEOUS | Status: AC
  Administered 2022-05-27: 2 via TOPICAL

## 2022-05-27 MED ORDER — PHENYLEPHRINE 80 MCG/ML (10ML) SYRINGE FOR IV PUSH (FOR BLOOD PRESSURE SUPPORT)
PREFILLED_SYRINGE | INTRAVENOUS | Status: DC | PRN
  Administered 2022-05-27 (×2): 80 ug via INTRAVENOUS

## 2022-05-27 MED ORDER — LACTATED RINGERS IV SOLN: INTRAVENOUS | Status: DC

## 2022-05-27 MED ORDER — OXYCODONE HCL 5 MG PO TABS: 5.0000 mg | ORAL_TABLET | Freq: Once | ORAL | Status: DC | PRN

## 2022-05-27 MED ORDER — CLONIDINE HCL (ANALGESIA) 100 MCG/ML EP SOLN
EPIDURAL | Status: DC | PRN
  Administered 2022-05-27: 100 ug

## 2022-05-27 MED ORDER — METHOCARBAMOL 500 MG IVPB - SIMPLE MED
500.0000 mg | Freq: Four times a day (QID) | INTRAVENOUS | Status: DC | PRN
  Administered 2022-05-27: 500 mg via INTRAVENOUS

## 2022-05-27 MED ORDER — ACETAMINOPHEN 500 MG PO TABS
1000.0000 mg | ORAL_TABLET | Freq: Once | ORAL | Status: AC
  Administered 2022-05-27: 1000 mg via ORAL
  Filled 2022-05-27: qty 2

## 2022-05-27 MED ORDER — ASPIRIN EC 81 MG PO TBEC: 81.0000 mg | DELAYED_RELEASE_TABLET | Freq: Two times a day (BID) | ORAL | 0 refills | Status: AC

## 2022-05-27 MED ORDER — ONDANSETRON 4 MG PO TBDP: 4.0000 mg | ORAL_TABLET | Freq: Two times a day (BID) | ORAL | 0 refills | Status: DC | PRN

## 2022-05-27 MED ORDER — ACETAMINOPHEN 325 MG PO TABS: 325.0000 mg | ORAL_TABLET | Freq: Four times a day (QID) | ORAL | Status: DC | PRN

## 2022-05-27 MED ORDER — SODIUM CHLORIDE 0.9% FLUSH
INTRAVENOUS | Status: DC | PRN
  Administered 2022-05-27: 30 mL

## 2022-05-27 MED ORDER — BUPIVACAINE-EPINEPHRINE (PF) 0.5% -1:200000 IJ SOLN
INTRAMUSCULAR | Status: DC | PRN
  Administered 2022-05-27: 15 mL via PERINEURAL

## 2022-05-27 MED ORDER — CHLORHEXIDINE GLUCONATE 0.12 % MT SOLN
15.0000 mL | Freq: Once | OROMUCOSAL | Status: AC
  Administered 2022-05-27: 15 mL via OROMUCOSAL

## 2022-05-27 MED ORDER — SUGAMMADEX SODIUM 200 MG/2ML IV SOLN
INTRAVENOUS | Status: DC | PRN
  Administered 2022-05-27: 200 mg via INTRAVENOUS

## 2022-05-27 MED ORDER — TRAMADOL HCL 50 MG PO TABS: 50.0000 mg | ORAL_TABLET | Freq: Four times a day (QID) | ORAL | Status: DC

## 2022-05-27 MED ORDER — CEFAZOLIN SODIUM-DEXTROSE 2-4 GM/100ML-% IV SOLN
2.0000 g | INTRAVENOUS | Status: AC
  Administered 2022-05-27: 2 g via INTRAVENOUS
  Filled 2022-05-27: qty 100

## 2022-05-27 MED ORDER — ONDANSETRON HCL 4 MG/2ML IJ SOLN
INTRAMUSCULAR | Status: AC
  Filled 2022-05-27: qty 2

## 2022-05-27 MED ORDER — OXYCODONE HCL 5 MG PO TABS
5.0000 mg | ORAL_TABLET | ORAL | Status: DC | PRN
  Administered 2022-05-27: 10 mg via ORAL

## 2022-05-27 MED ORDER — METHOCARBAMOL 750 MG PO TABS: 750.0000 mg | ORAL_TABLET | Freq: Three times a day (TID) | ORAL | 0 refills | Status: DC | PRN

## 2022-05-27 MED ORDER — SODIUM CHLORIDE (PF) 0.9 % IJ SOLN
INTRAMUSCULAR | Status: AC
  Filled 2022-05-27: qty 50

## 2022-05-27 MED ORDER — ROCURONIUM BROMIDE 10 MG/ML (PF) SYRINGE
PREFILLED_SYRINGE | INTRAVENOUS | Status: AC
  Filled 2022-05-27: qty 10

## 2022-05-27 MED ORDER — POVIDONE-IODINE 10 % EX SWAB: 2.0000 | Freq: Once | CUTANEOUS | Status: DC

## 2022-05-27 MED ORDER — OXYCODONE HCL 5 MG PO TABS
ORAL_TABLET | ORAL | Status: AC
  Filled 2022-05-27: qty 2

## 2022-05-27 MED ORDER — EPHEDRINE SULFATE-NACL 50-0.9 MG/10ML-% IV SOSY
PREFILLED_SYRINGE | INTRAVENOUS | Status: DC | PRN
  Administered 2022-05-27 (×2): 10 mg via INTRAVENOUS
  Administered 2022-05-27: 5 mg via INTRAVENOUS

## 2022-05-27 MED ORDER — 0.9 % SODIUM CHLORIDE (POUR BTL) OPTIME
TOPICAL | Status: DC | PRN
  Administered 2022-05-27: 1000 mL

## 2022-05-27 MED ORDER — HYDROMORPHONE HCL 1 MG/ML IJ SOLN
INTRAMUSCULAR | Status: AC
  Filled 2022-05-27: qty 1

## 2022-05-27 MED ORDER — BUPIVACAINE LIPOSOME 1.3 % IJ SUSP
INTRAMUSCULAR | Status: AC
  Filled 2022-05-27: qty 20

## 2022-05-27 MED ORDER — ACETAMINOPHEN 500 MG PO TABS
ORAL_TABLET | ORAL | Status: AC
  Filled 2022-05-27: qty 2

## 2022-05-27 MED ORDER — ORAL CARE MOUTH RINSE: 15.0000 mL | Freq: Once | OROMUCOSAL | Status: AC

## 2022-05-27 MED ORDER — TRANEXAMIC ACID-NACL 1000-0.7 MG/100ML-% IV SOLN: 1000.0000 mg | Freq: Once | INTRAVENOUS | Status: DC

## 2022-05-27 MED ORDER — HYDROMORPHONE HCL 1 MG/ML IJ SOLN
0.2500 mg | INTRAMUSCULAR | Status: DC | PRN
  Administered 2022-05-27 (×4): 0.5 mg via INTRAVENOUS

## 2022-05-27 SURGICAL SUPPLY — 53 items
BAG COUNTER SPONGE SURGICOUNT (BAG) IMPLANT
BAG SPNG CNTER NS LX DISP (BAG) ×1
BLADE HEX COATED 2.75 (ELECTRODE) ×2 IMPLANT
BLADE SAG 18X100X1.27 (BLADE) ×2 IMPLANT
BLADE SAGITTAL 25.0X1.37X90 (BLADE) ×2 IMPLANT
BLADE SURG 15 STRL LF DISP TIS (BLADE) ×2 IMPLANT
BLADE SURG 15 STRL SS (BLADE) ×1
BLADE SURG SZ10 CARB STEEL (BLADE) IMPLANT
BNDG CMPR MED 10X6 ELC LF (GAUZE/BANDAGES/DRESSINGS) ×1
BNDG ELASTIC 6X10 VLCR STRL LF (GAUZE/BANDAGES/DRESSINGS) ×2 IMPLANT
BOWL SMART MIX CTS (DISPOSABLE) IMPLANT
BSPLAT TIB 7 KN TRITANIUM (Knees) ×1 IMPLANT
CLSR STERI-STRIP ANTIMIC 1/2X4 (GAUZE/BANDAGES/DRESSINGS) ×2 IMPLANT
COMPONENT TRI CR RETAIN SZ6 RT (Miscellaneous) IMPLANT
COVER SURGICAL LIGHT HANDLE (MISCELLANEOUS) ×2 IMPLANT
CUFF TOURN SGL QUICK 34 (TOURNIQUET CUFF) ×1
CUFF TRNQT CYL 34X4.125X (TOURNIQUET CUFF) ×2 IMPLANT
DRAPE U-SHAPE 47X51 STRL (DRAPES) ×2 IMPLANT
DRSG MEPILEX POST OP 4X12 (GAUZE/BANDAGES/DRESSINGS) ×2 IMPLANT
DURAPREP 26ML APPLICATOR (WOUND CARE) ×4 IMPLANT
GLOVE BIO SURGEON STRL SZ7.5 (GLOVE) ×4 IMPLANT
GLOVE BIOGEL PI IND STRL 7.5 (GLOVE) ×2 IMPLANT
GLOVE BIOGEL PI IND STRL 8 (GLOVE) ×2 IMPLANT
GLOVE SURG SYN 7.5  E (GLOVE) ×1
GLOVE SURG SYN 7.5 E (GLOVE) ×1 IMPLANT
GLOVE SURG SYN 7.5 PF PI (GLOVE) ×2 IMPLANT
GOWN STRL REUS W/ TWL LRG LVL3 (GOWN DISPOSABLE) ×2 IMPLANT
GOWN STRL REUS W/ TWL XL LVL3 (GOWN DISPOSABLE) ×2 IMPLANT
GOWN STRL REUS W/TWL LRG LVL3 (GOWN DISPOSABLE) ×1
GOWN STRL REUS W/TWL XL LVL3 (GOWN DISPOSABLE) ×1
HANDPIECE INTERPULSE COAX TIP (DISPOSABLE) ×1
HOLDER FOLEY CATH W/STRAP (MISCELLANEOUS) IMPLANT
IMMOBILIZER KNEE 22 UNIV (SOFTGOODS) ×2 IMPLANT
INSERT TIBIAL SZ7 CS 11 (Miscellaneous) IMPLANT
KNEE PATELLA ASYMMETRIC 10X35 (Knees) IMPLANT
KNEE TIBIAL COMPONENT SZ7 (Knees) IMPLANT
MANIFOLD NEPTUNE II (INSTRUMENTS) ×2 IMPLANT
NS IRRIG 1000ML POUR BTL (IV SOLUTION) ×2 IMPLANT
PACK TOTAL KNEE CUSTOM (KITS) ×2 IMPLANT
PIN FLUTED HEDLESS FIX 3.5X1/8 (PIN) IMPLANT
PROTECTOR NERVE ULNAR (MISCELLANEOUS) ×2 IMPLANT
SET HNDPC FAN SPRY TIP SCT (DISPOSABLE) ×2 IMPLANT
SPIKE FLUID TRANSFER (MISCELLANEOUS) ×2 IMPLANT
SUT MNCRL AB 3-0 PS2 18 (SUTURE) ×2 IMPLANT
SUT VIC AB 0 CT1 36 (SUTURE) ×2 IMPLANT
SUT VIC AB 1 CT1 36 (SUTURE) ×4 IMPLANT
SUT VIC AB 2-0 CT1 27 (SUTURE) ×1
SUT VIC AB 2-0 CT1 TAPERPNT 27 (SUTURE) ×2 IMPLANT
TRAY FOLEY MTR SLVR 14FR STAT (SET/KITS/TRAYS/PACK) IMPLANT
TRAY FOLEY MTR SLVR 16FR STAT (SET/KITS/TRAYS/PACK) IMPLANT
TRIATHLON CRUCIATE RETAIN SZ6 (Miscellaneous) ×1 IMPLANT
TUBE SUCTION HIGH CAP CLEAR NV (SUCTIONS) ×2 IMPLANT
WRAP KNEE MAXI GEL POST OP (GAUZE/BANDAGES/DRESSINGS) ×2 IMPLANT

## 2022-05-27 NOTE — Discharge Instructions (Addendum)

## 2022-05-27 NOTE — Op Note (Signed)
DATE OF SURGERY:  05/27/2022 TIME: 8:56 AM  PATIENT NAME:  Craig Mccann   AGE: 71 y.o.    PRE-OPERATIVE DIAGNOSIS:  OA RIGHT KNEE  POST-OPERATIVE DIAGNOSIS:  Same  PROCEDURE:  Procedure(s): TOTAL KNEE ARTHROPLASTY   SURGEON:  Renette Butters, MD   ASSISTANT:  Aggie Moats, PA-C, he was present and scrubbed throughout the case, critical for completion in a timely fashion, and for retraction, instrumentation, and closure.    OPERATIVE IMPLANTS: [employer] Triathlon CR. Press fit knee  Femur size 6, Tibia size 7, Patella size 35 3-peg oval button, with a 11 mm polyethylene insert.   PREOPERATIVE INDICATIONS:  EMIN FOREE is a 71 y.o. year old male with end stage bone on bone degenerative arthritis of the knee who failed conservative treatment, including injections, antiinflammatories, activity modification, and assistive devices, and had significant impairment of their activities of daily living, and elected for Total Knee Arthroplasty.   The risks, benefits, and alternatives were discussed at length including but not limited to the risks of infection, bleeding, nerve injury, stiffness, blood clots, the need for revision surgery, cardiopulmonary complications, among others, and they were willing to proceed.   OPERATIVE DESCRIPTION:  The patient was brought to the operative room and placed in a supine position.  General anesthesia was administered.  IV antibiotics were given.  The lower extremity was prepped and draped in the usual sterile fashion.  Time out was performed.  The leg was elevated and exsanguinated and the tourniquet was inflated.  Anterior approach was performed.  The patella was everted and osteophytes were removed.  The anterior horn of the medial and lateral meniscus was removed.   The distal femur was opened with the drill and the intramedullary distal femoral cutting jig was utilized, set at 5 degrees resecting 8 mm off the distal femur.  Care was taken  to protect the collateral ligaments.  The distal femoral sizing jig was applied, taking care to avoid notching.  Then the 4-in-1 cutting jig was applied and the anterior and posterior femur was cut, along with the chamfer cuts.  All posterior osteophytes were removed.  The flexion gap was then measured and was symmetric with the extension gap.  Then the extramedullary tibial cutting jig was utilized making the appropriate cut using the anterior tibial crest as a reference building in appropriate posterior slope.  Care was taken during the cut to protect the medial and collateral ligaments.  The proximal tibia was removed along with the posterior horns of the menisci.  The PCL was sacrificed.    The extensor gap was measured and was approximately 40m.    I completed the distal femoral preparation using the appropriate jig to prepare the box.  The patella was then measured, and cut with the saw.    The proximal tibia sized and prepared accordingly with the reamer and the punch, and then all components were trialed with the above sized poly insert.  The knee was found to have excellent balance and full motion.    The above named components were then impacted into place and Poly tibial piece and patella were inserted.  I was very happy with his stability and ROM  I performed a periarticular injection with marcaine and toradol  The knee was easily taken through a range of motion and the patella tracked well and the knee irrigated copiously and the parapatellar and subcutaneous tissue closed with vicryl, and monocryl with steri strips for the skin.  The  incision was dressed with sterile gauze and the tourniquet released and the patient was awakened and returned to the PACU in stable and satisfactory condition.  There were no complications.  Total tourniquet time was roughly 75 minutes.   POSTOPERATIVE PLAN: post op Abx, DVT px: SCD's, TED's, Early ambulation and chemical px

## 2022-05-27 NOTE — Interval H&P Note (Signed)
History and Physical Interval Note:  05/27/2022 6:55 AM  Craig Mccann  has presented today for surgery, with the diagnosis of OA RIGHT KNEE.  The various methods of treatment have been discussed with the patient and family. After consideration of risks, benefits and other options for treatment, the patient has consented to  Procedure(s): TOTAL KNEE ARTHROPLASTY (Right) as a surgical intervention.  The patient's history has been reviewed, patient examined, no change in status, stable for surgery.  I have reviewed the patient's chart and labs.  Questions were answered to the patient's satisfaction.     Renette Butters

## 2022-05-27 NOTE — Anesthesia Procedure Notes (Signed)
Anesthesia Regional Block: Adductor canal block   Pre-Anesthetic Checklist: , timeout performed,  Correct Patient, Correct Site, Correct Laterality,  Correct Procedure, Correct Position, site marked,  Risks and benefits discussed,  Pre-op evaluation,  At surgeon's request and post-op pain management  Laterality: Right  Prep: Maximum Sterile Barrier Precautions used, chloraprep       Needles:  Injection technique: Single-shot  Needle Type: Echogenic Stimulator Needle     Needle Length: 9cm  Needle Gauge: 22     Additional Needles:   Procedures:,,,, ultrasound used (permanent image in chart),,    Narrative:  Start time: 05/27/2022 7:12 AM End time: 05/27/2022 7:15 AM Injection made incrementally with aspirations every 5 mL.  Performed by: Personally  Anesthesiologist: Brennan Bailey, MD  Additional Notes: Risks, benefits, and alternative discussed. Patient gave consent for procedure. Patient prepped and draped in sterile fashion. Sedation administered, patient remains easily responsive to voice. Relevant anatomy identified with ultrasound guidance. Local anesthetic given in 5cc increments with no signs or symptoms of intravascular injection. No pain or paraesthesias with injection. Patient monitored throughout procedure with signs of LAST or immediate complications. Tolerated well. Ultrasound image placed in chart.  Tawny Asal, MD

## 2022-05-27 NOTE — Transfer of Care (Signed)
Immediate Anesthesia Transfer of Care Note  Patient: Craig Mccann  Procedure(s) Performed: TOTAL KNEE ARTHROPLASTY (Right: Knee)  Patient Location: PACU  Anesthesia Type:General  Level of Consciousness: drowsy  Airway & Oxygen Therapy: Patient Spontanous Breathing and Patient connected to face mask oxygen  Post-op Assessment: Report given to RN and Post -op Vital signs reviewed and stable  Post vital signs: Reviewed and stable  Last Vitals:  Vitals Value Taken Time  BP 133/99 05/27/22 0917  Temp    Pulse 71 05/27/22 0920  Resp 18 05/27/22 0920  SpO2 100 % 05/27/22 0920  Vitals shown include unvalidated device data.  Last Pain:  Vitals:   05/27/22 0619  TempSrc: Oral  PainSc:          Complications: No notable events documented.

## 2022-05-27 NOTE — Evaluation (Signed)
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 Ambulated x 120' with supervision, practiced stesps, Has HEP until goes to OPPT. Patient has met PT goals         Recommendations for follow up therapy are one component of a multi-disciplinary discharge planning process, led by the attending physician.  Recommendations may be updated based on patient status, additional functional criteria and insurance authorization.  Follow Up Recommendations Follow physician's recommendations for discharge plan and follow up therapies      Assistance Recommended at Discharge None  Patient can return home with the following  Help with stairs or ramp for entrance;Assistance with cooking/housework;Assist for transportation    Equipment Recommendations None recommended by PT  Recommendations for Other Services       Functional Status Assessment Patient has had a recent decline in their functional status and demonstrates the ability to make significant improvements in function in a reasonable and predictable amount of time.     Precautions / Restrictions Precautions Precautions: Knee;Fall Restrictions Weight Bearing Restrictions: No      Mobility  Bed Mobility Overal bed mobility: Independent                  Transfers Overall transfer level: Needs assistance Equipment used: Rolling walker (2 wheels) Transfers: Sit to/from Stand Sit to Stand: Supervision                Ambulation/Gait Ambulation/Gait assistance: Supervision Gait Distance (Feet): 120 Feet Assistive device: Rolling walker (2 wheels) Gait Pattern/deviations: Step-through pattern       General Gait Details: cues for slow and safe  Stairs Stairs: Yes Stairs assistance: Min  guard Stair Management: Two rails, Forwards Number of Stairs: 2    Wheelchair Mobility    Modified Rankin (Stroke Patients Only)       Balance Overall balance assessment: No apparent balance deficits (not formally assessed)                                           Pertinent Vitals/Pain Pain Assessment Pain Assessment: 0-10 Pain Score: 2  Pain Location: right knee Pain Descriptors / Indicators: Discomfort Pain Intervention(s): Monitored during session    Home Living Family/patient expects to be discharged to:: Private residence Living Arrangements: Spouse/significant other Available Help at Discharge: Family Type of Home: House Home Access: Stairs to enter Entrance Stairs-Rails: Psychiatric nurse of Steps: 5   Home Layout: One level Home Equipment: Conservation officer, nature (2 wheels);Cane - single point      Prior Function Prior Level of Function : Independent/Modified Independent                     Hand Dominance        Extremity/Trunk Assessment   Upper Extremity Assessment Upper Extremity Assessment: Overall WFL for tasks assessed    Lower Extremity Assessment Lower Extremity Assessment: RLE deficits/detail RLE Deficits / Details: knee flex to 80, +SLR    Cervical / Trunk Assessment Cervical / Trunk Assessment: Normal  Communication   Communication: No difficulties  Cognition Arousal/Alertness: Awake/alert Behavior During Therapy: WFL for tasks assessed/performed Overall Cognitive Status: Within Functional Limits for tasks assessed  General Comments      Exercises Total Joint Exercises Quad Sets: AROM, Both Heel Slides: AROM, Right Hip ABduction/ADduction: AROM, Right Straight Leg Raises: AROM, Right   Assessment/Plan    PT Assessment All further PT needs can be met in the next venue of care  PT Problem List Decreased strength;Decreased activity  tolerance;Decreased mobility;Decreased safety awareness;Decreased range of motion;Decreased knowledge of use of DME;Decreased knowledge of precautions       PT Treatment Interventions      PT Goals (Current goals can be found in the Care Plan section)  Acute Rehab PT Goals Patient Stated Goal: home PT Goal Formulation: All assessment and education complete, DC therapy    Frequency       Co-evaluation               AM-PAC PT "6 Clicks" Mobility  Outcome Measure Help needed turning from your back to your side while in a flat bed without using bedrails?: None Help needed moving from lying on your back to sitting on the side of a flat bed without using bedrails?: None Help needed moving to and from a bed to a chair (including a wheelchair)?: A Little Help needed standing up from a chair using your arms (e.g., wheelchair or bedside chair)?: A Little Help needed to walk in hospital room?: A Little Help needed climbing 3-5 steps with a railing? : A Little 6 Click Score: 20    End of Session Equipment Utilized During Treatment: Gait belt Activity Tolerance: Patient tolerated treatment well Patient left: in bed;with call bell/phone within reach;with family/visitor present Nurse Communication: Mobility status PT Visit Diagnosis: Unsteadiness on feet (R26.81);Difficulty in walking, not elsewhere classified (R26.2)    Time: 2811-8867 PT Time Calculation (min) (ACUTE ONLY): 35 min   Charges:   PT Evaluation $PT Eval Low Complexity: 1 Low PT Treatments $Gait Training: 8-22 mins        [place] Office [phone] Weekend [phone]   Claretha Cooper 05/27/2022, 4:52 PM

## 2022-05-27 NOTE — Anesthesia Procedure Notes (Signed)
Procedure Name: Intubation Date/Time: 05/27/2022 7:21 AM  Performed by: Niel Hummer, CRNAPre-anesthesia Checklist: Patient identified and Emergency Drugs available Patient Re-evaluated:Patient Re-evaluated prior to induction Oxygen Delivery Method: Circle system utilized Preoxygenation: Pre-oxygenation with 100% oxygen Induction Type: IV induction Ventilation: Oral airway inserted - appropriate to patient size and Two handed mask ventilation required Laryngoscope Size: Mac and 4 Grade View: Grade I Tube type: Oral Tube size: 7.5 mm Number of attempts: 1 Airway Equipment and Method: Stylet Placement Confirmation: ETT inserted through vocal cords under direct vision, positive ETCO2 and breath sounds checked- equal and bilateral Secured at: 23 cm Tube secured with: Tape Dental Injury: Teeth and Oropharynx as per pre-operative assessment

## 2022-05-27 NOTE — Anesthesia Postprocedure Evaluation (Signed)
Anesthesia Post Note  Patient: Craig Mccann  Procedure(s) Performed: TOTAL KNEE ARTHROPLASTY (Right: Knee)     Patient location during evaluation: PACU Anesthesia Type: General Level of consciousness: awake and alert Pain management: pain level controlled Vital Signs Assessment: post-procedure vital signs reviewed and stable Respiratory status: spontaneous breathing, nonlabored ventilation and respiratory function stable Cardiovascular status: blood pressure returned to baseline Postop Assessment: no apparent nausea or vomiting Anesthetic complications: no   No notable events documented.  Last Vitals:  Vitals:   05/27/22 1130 05/27/22 1200  BP:  117/82  Pulse: 68 70  Resp: 16 15  Temp:    SpO2: 93% 91%    Last Pain:  Vitals:   05/27/22 1100  TempSrc:   PainSc: [place]

## 2022-05-27 NOTE — Progress Notes (Signed)
Orthopedic Tech Progress Note Patient Details:  GILBERTO STANFORTH 08/14/1950 [id number]  Patient ID: [name], male   DOB: Aug 09, 1950, 71 y.o.   MRN: [id number]  Kennis Carina 05/27/2022, 10:09 AM Bone foam applied in pacu

## 2022-05-28 ENCOUNTER — Encounter (HOSPITAL_COMMUNITY): Payer: Self-pay | Admitting: Orthopedic Surgery

## 2022-05-29 DIAGNOSIS — M25561 Pain in right knee: Secondary | ICD-10-CM | POA: Diagnosis not present

## 2022-05-30 DIAGNOSIS — M1711 Unilateral primary osteoarthritis, right knee: Secondary | ICD-10-CM | POA: Diagnosis not present

## 2022-05-30 DIAGNOSIS — Z96651 Presence of right artificial knee joint: Secondary | ICD-10-CM | POA: Diagnosis not present

## 2022-06-02 DIAGNOSIS — M25561 Pain in right knee: Secondary | ICD-10-CM | POA: Diagnosis not present

## 2022-06-10 DIAGNOSIS — M25561 Pain in right knee: Secondary | ICD-10-CM | POA: Diagnosis not present

## 2022-06-11 ENCOUNTER — Ambulatory Visit: Payer: Medicare HMO | Admitting: Cardiology

## 2022-06-11 DIAGNOSIS — M1711 Unilateral primary osteoarthritis, right knee: Secondary | ICD-10-CM | POA: Diagnosis not present

## 2022-06-13 DIAGNOSIS — M25561 Pain in right knee: Secondary | ICD-10-CM | POA: Diagnosis not present

## 2022-06-17 DIAGNOSIS — M25561 Pain in right knee: Secondary | ICD-10-CM | POA: Diagnosis not present

## 2022-06-18 ENCOUNTER — Telehealth: Payer: Self-pay

## 2022-06-18 ENCOUNTER — Encounter: Payer: Self-pay | Admitting: Internal Medicine

## 2022-06-18 ENCOUNTER — Ambulatory Visit: Payer: Medicare HMO | Attending: Internal Medicine | Admitting: Internal Medicine

## 2022-06-18 ENCOUNTER — Other Ambulatory Visit: Payer: Self-pay

## 2022-06-18 DIAGNOSIS — E782 Mixed hyperlipidemia: Secondary | ICD-10-CM

## 2022-06-18 NOTE — Progress Notes (Signed)
Patient was due to have a virtual visit with Dr. Alain Honey and patient did not answer. Dr. Debara Pickett asked for labs and appointment to be rescheduled after labs are completed. Placed order for NMR and LP(a). Called patient and left a voice message asking for him to give me a call back at the office.

## 2022-06-18 NOTE — Telephone Encounter (Signed)
  Patient Consent for Virtual Visit        Craig Mccann has provided verbal consent on 06/18/2022 for a virtual visit (video or telephone).   CONSENT FOR VIRTUAL VISIT FOR:  Craig Mccann  By participating in this virtual visit I agree to the following:  I hereby voluntarily request, consent and authorize [facility] and its employed or contracted physicians, physician assistants, nurse practitioners or other licensed health care professionals (the Practitioner), to provide me with telemedicine health care services (the "Services") as deemed necessary by the treating Practitioner. I acknowledge and consent to receive the Services by the Practitioner via telemedicine. I understand that the telemedicine visit will involve communicating with the Practitioner through live audiovisual communication technology and the disclosure of certain medical information by electronic transmission. I acknowledge that I have been given the opportunity to request an in-person assessment or other available alternative prior to the telemedicine visit and am voluntarily participating in the telemedicine visit.  I understand that I have the right to withhold or withdraw my consent to the use of telemedicine in the course of my care at any time, without affecting my right to future care or treatment, and that the Practitioner or I may terminate the telemedicine visit at any time. I understand that I have the right to inspect all information obtained and/or recorded in the course of the telemedicine visit and may receive copies of available information for a reasonable fee.  I understand that some of the potential risks of receiving the Services via telemedicine include:  Delay or interruption in medical evaluation due to technological equipment failure or disruption; Information transmitted may not be sufficient (e.g. poor resolution of images) to allow for appropriate medical decision making by the  Practitioner; and/or  In rare instances, security protocols could fail, causing a breach of personal health information.  Furthermore, I acknowledge that it is my responsibility to provide information about my medical history, conditions and care that is complete and accurate to the best of my ability. I acknowledge that Practitioner's advice, recommendations, and/or decision may be based on factors not within their control, such as incomplete or inaccurate data provided by me or distortions of diagnostic images or specimens that may result from electronic transmissions. I understand that the practice of medicine is not an exact science and that Practitioner makes no warranties or guarantees regarding treatment outcomes. I acknowledge that a copy of this consent can be made available to me via my patient portal ([facility]), or I can request a printed copy by calling the office of [facility].    I understand that my insurance will be billed for this visit.   I have read or had this consent read to me. I understand the contents of this consent, which adequately explains the benefits and risks of the Services being provided via telemedicine.  I have been provided ample opportunity to ask questions regarding this consent and the Services and have had my questions answered to my satisfaction. I give my informed consent for the services to be provided through the use of telemedicine in my medical care

## 2022-06-18 NOTE — Progress Notes (Signed)
Erroneous encounter

## 2022-06-20 DIAGNOSIS — M25561 Pain in right knee: Secondary | ICD-10-CM | POA: Diagnosis not present

## 2022-06-23 DIAGNOSIS — D485 Neoplasm of uncertain behavior of skin: Secondary | ICD-10-CM | POA: Diagnosis not present

## 2022-06-23 DIAGNOSIS — C4442 Squamous cell carcinoma of skin of scalp and neck: Secondary | ICD-10-CM | POA: Diagnosis not present

## 2022-06-27 DIAGNOSIS — M25561 Pain in right knee: Secondary | ICD-10-CM | POA: Diagnosis not present

## 2022-06-30 DIAGNOSIS — M25561 Pain in right knee: Secondary | ICD-10-CM | POA: Diagnosis not present

## 2022-07-07 DIAGNOSIS — R351 Nocturia: Secondary | ICD-10-CM | POA: Diagnosis not present

## 2022-07-07 DIAGNOSIS — R7301 Impaired fasting glucose: Secondary | ICD-10-CM | POA: Diagnosis not present

## 2022-07-07 DIAGNOSIS — N1831 Chronic kidney disease, stage 3a: Secondary | ICD-10-CM | POA: Diagnosis not present

## 2022-07-07 DIAGNOSIS — J439 Emphysema, unspecified: Secondary | ICD-10-CM | POA: Diagnosis not present

## 2022-07-07 DIAGNOSIS — I25119 Atherosclerotic heart disease of native coronary artery with unspecified angina pectoris: Secondary | ICD-10-CM | POA: Diagnosis not present

## 2022-07-07 DIAGNOSIS — G4709 Other insomnia: Secondary | ICD-10-CM | POA: Diagnosis not present

## 2022-07-07 DIAGNOSIS — E782 Mixed hyperlipidemia: Secondary | ICD-10-CM | POA: Diagnosis not present

## 2022-07-07 DIAGNOSIS — I5022 Chronic systolic (congestive) heart failure: Secondary | ICD-10-CM | POA: Diagnosis not present

## 2022-07-07 DIAGNOSIS — Z0000 Encounter for general adult medical examination without abnormal findings: Secondary | ICD-10-CM | POA: Diagnosis not present

## 2022-07-09 DIAGNOSIS — M1711 Unilateral primary osteoarthritis, right knee: Secondary | ICD-10-CM | POA: Diagnosis not present

## 2022-07-14 DIAGNOSIS — H0289 Other specified disorders of eyelid: Secondary | ICD-10-CM | POA: Diagnosis not present

## 2022-07-14 DIAGNOSIS — H353131 Nonexudative age-related macular degeneration, bilateral, early dry stage: Secondary | ICD-10-CM | POA: Diagnosis not present

## 2022-07-14 DIAGNOSIS — H35131 Retinopathy of prematurity, stage 2, right eye: Secondary | ICD-10-CM | POA: Diagnosis not present

## 2022-07-17 ENCOUNTER — Telehealth: Payer: Self-pay

## 2022-07-17 DIAGNOSIS — C4442 Squamous cell carcinoma of skin of scalp and neck: Secondary | ICD-10-CM | POA: Diagnosis not present

## 2022-07-17 NOTE — Telephone Encounter (Signed)
-----   Message from Damian Leavell, RN sent at 06/17/2022  7:28 AM EST ----- Regarding: FW: schedule ICD a few weeks after knee replacement scheduled for 10/24 This is a [employer].  He wanted to wait to schedule his ICD implant until after his knee replacement.  Sonia Baller  ----- Message ----- From: Damian Leavell, RN Sent: 06/16/2022  12:00 AM EST To: Damian Leavell, RN Subject: schedule ICD a few weeks after knee replacem#

## 2022-07-17 NOTE — Telephone Encounter (Signed)
LM for pt to call back to schedule procedure.

## 2022-07-24 ENCOUNTER — Encounter: Payer: Self-pay | Admitting: *Deleted

## 2022-07-24 ENCOUNTER — Encounter: Payer: Self-pay | Admitting: Cardiology

## 2022-07-24 ENCOUNTER — Ambulatory Visit: Payer: Medicare HMO | Attending: Cardiology | Admitting: Cardiology

## 2022-07-24 VITALS — BP 122/74 | HR 90 | Ht 73.0 in | Wt 213.2 lb

## 2022-07-24 DIAGNOSIS — I2510 Atherosclerotic heart disease of native coronary artery without angina pectoris: Secondary | ICD-10-CM | POA: Diagnosis not present

## 2022-07-24 DIAGNOSIS — E782 Mixed hyperlipidemia: Secondary | ICD-10-CM | POA: Diagnosis not present

## 2022-07-24 DIAGNOSIS — I5022 Chronic systolic (congestive) heart failure: Secondary | ICD-10-CM | POA: Diagnosis not present

## 2022-07-24 MED ORDER — NITROGLYCERIN 0.4 MG SL SUBL: 0.4000 mg | SUBLINGUAL_TABLET | SUBLINGUAL | 3 refills | Status: DC | PRN

## 2022-07-24 NOTE — Patient Instructions (Signed)
Medication Instructions:  Nitroglycerin refilled today  Continue all other medications.     Labwork: none  Testing/Procedures: Your physician has requested that you have a limited echocardiogram. Echocardiography is a painless test that uses sound waves to create images of your heart. It provides your doctor with information about the size and shape of your heart and how well your heart's chambers and valves are working. This procedure takes approximately one hour. There are no restrictions for this procedure. Please do NOT wear cologne, perfume, aftershave, or lotions (deodorant is allowed). Please arrive 15 minutes prior to your appointment time.  Office will contact with results via phone, letter or mychart.     Follow-Up: 6 months   Any Other Special Instructions Will Be Listed Below (If Applicable). You have been referred to [facility]   If you need a refill on your cardiac medications before your next appointment, please call your pharmacy.

## 2022-07-24 NOTE — Progress Notes (Signed)
Clinical Summary Craig Mccann is a 71 y.o.male seen today for follow up of the following medical problems.    1. Chest pain/CAD 06/2017 nuclear stress: moderate mid inferoseptal and mid inferior defect with peri-infarct ischemia. LVEF 46% - 06/2017 echo LVEF 68-12%, grade I diastolic dysfunction     02/5169 cath: LAD patent, D1 20%, ramus 25%, LCX 65% prox, RCA mid 15% and distal 30% - chronic episodes of chest pains roughly 3 a month, improve with with NG     12/2021 cath: LAD patent, D1 20%, ramus 25%, prox LCX 50%, RCA mi 15% and distal 30%    - no chest pains, compliant with meds - headaches on imdur, stopped taking      2. Chronic systolic HF New diagnosis of systolic HF in 0/1749 11/4965 echo LVEF 30-35%, mod to severe asymmetric septal hypertrophy, grade I dd,  10/2020 cath: LAD patent, D1 20%, ramus 25%, LCX 65% prox, RCA mid 15% and distal 30%    - significant elevation in Cr with losartan to 1.8 on 06/12/21 - medication stopped, at recheck Cr back to baseline 1.48     -last visit started aldactone 12.'5mg'$  daily, farxiga '10mg'$  daiy. - repeat labs Cr 1.54, K 4.4 -no recent symptoms.  -appears he accidentally restarted his losartan    10/2021 echo: LVEF 30-35% - some edema in right leg after knee replacement, no SOB/DOE - still consiering ICD, wanted to wait until after knee surgery. Enough time has passed would repeat limited echo before reconsidering.      3. Hyperlipidemia 03/2021 TC 202 TG 117 HDL 39 LDL 142 - reports joint pains, he stopped taking atorvastatin about 1 month and symptoms improved. - ongoing muscle aches with crestor.   - refererred to lipid clinic, does not appear was able to see     4. Knee replacement 05/2022 Past Medical History:  Diagnosis Date   Acute prostatitis 01/2015   Allergy    Arthritis    Basal cell carcinoma 2017   Multiple: back, left shin, left arm   CAD (coronary artery disease)    a. 07/2017: cath showing 50-60%  stenosis along the LCx with a FFR of 0.94 not being hemodynamically significant   CAP (community acquired pneumonia) 06/26/2015   Chronic renal insufficiency, stage II (mild)    stage II/II: CrCl 60s.   Complication of anesthesia    COPD (chronic obstructive pulmonary disease) ([place]) 05/07/2015   Long time smoker + COPD changes noted on lung ca screening CT   Diverticulitis    Encounter for screening for lung cancer    Screening CT ok 08/27/16--repeat in 1 yr recommended.   Family history of colon cancer in mother    Hearing impairment    hearing aids   History of adenomatous polyp of colon 12/24/2010   History of kidney stones    Hyperlipidemia 07/14/2016   started atorv 20 07/16/16   Kidney stones    bilat nonobstrucing renal calculi 2016, + left benign renal cysts   Melanoma ([place])    R posterolateral neck and R nasal ala.  Most recent was left mid back 01/2016 (Dr. Tarri Glenn).   Spinal headache    Tobacco dependence    CT chest for lung ca screning 04/2015 showed benign findings; 1 yr repeat recommended     Allergies  Allergen Reactions   Codeine Hives   Lexiscan [Regadenoson] Other (See Comments)    Transient Heart Block   Valium [Diazepam] Anaphylaxis  Current Outpatient Medications  Medication Sig Dispense Refill   acetaminophen (TYLENOL) 500 MG tablet Take 2 tablets (1,000 mg total) by mouth every 6 (six) hours as needed for mild pain or moderate pain. 60 tablet 0   albuterol (PROAIR HFA) 108 (90 BASE) MCG/ACT inhaler Inhale 2 puffs into the lungs every 6 (six) hours as needed for wheezing or shortness of breath. 1 Inhaler 1   aspirin EC 81 MG tablet Take 1 tablet (81 mg total) by mouth 2 (two) times daily. For DVT prophylaxis for 30 days after surgery. 60 tablet 0   Budeson-Glycopyrrol-Formoterol 160-9-4.8 MCG/ACT AERO Inhale 2 puffs into the lungs 2 (two) times daily.     carvedilol (COREG) 12.5 MG tablet Take 1 tablet (12.5 mg total) by mouth 2 (two) times daily.  (Patient taking differently: Take 12.5 mg by mouth daily.) 180 tablet 3   docusate sodium (COLACE) 100 MG capsule Take 1 capsule (100 mg total) by mouth 2 (two) times daily as needed for mild constipation or moderate constipation. (Patient not taking: Reported on 06/18/2022) 20 capsule 0   FARXIGA 10 MG TABS tablet TAKE 1 TABLET BY MOUTH DAILY BEFORE BREAKFAST 30 tablet 3   isosorbide mononitrate (IMDUR) 30 MG 24 hr tablet Take 15 mg by mouth daily.     meloxicam (MOBIC) 15 MG tablet Take 1 tablet (15 mg total) by mouth daily as needed for pain (and inflammation). (Patient not taking: Reported on 06/18/2022) 30 tablet 0   methocarbamol (ROBAXIN-750) 750 MG tablet Take 1 tablet (750 mg total) by mouth every 8 (eight) hours as needed for muscle spasms. 20 tablet 0   nitroGLYCERIN (NITROSTAT) 0.4 MG SL tablet Place 1 tablet (0.4 mg total) under the tongue every 5 (five) minutes as needed for chest pain. 25 tablet 3   Omega-3 Fatty Acids (OMEGA 3 PO) Take 2 tablets by mouth daily.     ondansetron (ZOFRAN-ODT) 4 MG disintegrating tablet Take 1 tablet (4 mg total) by mouth 2 (two) times daily as needed for nausea or vomiting. (Patient not taking: Reported on 06/18/2022) 10 tablet 0   oxyCODONE (ROXICODONE) 5 MG immediate release tablet Take 1 tablet (5 mg total) by mouth every 6 (six) hours as needed for severe pain. Do not take more than 6 tablets in a 24 hour period. (Patient not taking: Reported on 06/18/2022) 28 tablet 0   spironolactone (ALDACTONE) 25 MG tablet TAKE 1/2 TABLET BY MOUTH DAILY 15 tablet 3   Tetrahydrozoline HCl (VISINE OP) Place 1 drop into both eyes daily as needed (irritation).     traZODone (DESYREL) 50 MG tablet Take 50 mg by mouth at bedtime as needed for sleep.     No current facility-administered medications for this visit.     Past Surgical History:  Procedure Laterality Date   CERVICAL FUSION     CHOLECYSTECTOMY  1987   COLONOSCOPY  12/24/10; 2014   Multiple TCSs: pt on 5  yr recall, next due 2019.   COLONOSCOPY N/A 10/09/2017   Procedure: COLONOSCOPY;  Surgeon: Danie Binder, MD;  [facility];  Service: Endoscopy;  Laterality: N/A;  1:30   INGUINAL HERNIA REPAIR  1984   INTRAVASCULAR PRESSURE WIRE/FFR STUDY N/A 07/14/2017   Procedure: INTRAVASCULAR PRESSURE WIRE/FFR STUDY;  Surgeon: Belva Crome, MD;  Location: [place] [facility];  Service: Cardiovascular;  Laterality: N/A;   JOINT REPLACEMENT     left   KNEE SURGERY Left 2015   cartilage repair  KNEE SURGERY Right 1997   "          "   LEFT HEART CATH AND CORONARY ANGIOGRAPHY N/A 07/14/2017   Procedure: LEFT HEART CATH AND CORONARY ANGIOGRAPHY;  Surgeon: Belva Crome, MD;  Location: [place] [facility];  Service: Cardiovascular;  Laterality: N/A;   LEFT HEART CATH AND CORONARY ANGIOGRAPHY N/A 10/25/2020   Procedure: LEFT HEART CATH AND CORONARY ANGIOGRAPHY;  Surgeon: Troy Sine, MD;  Location: [facility] [facility];  Service: Cardiovascular;  Laterality: N/A;   LEFT HEART CATH AND CORONARY ANGIOGRAPHY N/A 12/12/2021   Procedure: LEFT HEART CATH AND CORONARY ANGIOGRAPHY;  Surgeon: Martinique, Peter M, MD;  Location: [place] [facility];  Service: Cardiovascular;  Laterality: N/A;   LITHOTRIPSY     NECK SURGERY  1989   Thumb surgery Bilateral 2005   and 2006   TOTAL HIP ARTHROPLASTY Left 11/20/2020   Procedure: TOTAL HIP ARTHROPLASTY ANTERIOR APPROACH;  Surgeon: Renette Butters, MD;  Location: [facility];  Service: Orthopedics;  Laterality: Left;   TOTAL KNEE ARTHROPLASTY Left 04/23/2016   Procedure: TOTAL KNEE ARTHROPLASTY;  Surgeon: Ninetta Lights, MD;  Location: [facility];  Service: Orthopedics;  Laterality: Left;   TOTAL KNEE ARTHROPLASTY Right 05/27/2022   Procedure: TOTAL KNEE ARTHROPLASTY;  Surgeon: Renette Butters, MD;  Location: [facility];  Service: Orthopedics;  Laterality: Right;     Allergies  Allergen Reactions   Codeine Hives   Lexiscan [Regadenoson] Other (See Comments)    Transient  Heart Block   Valium [Diazepam] Anaphylaxis      Family History  Problem Relation Age of Onset   Colon cancer Mother    Cancer Mother    Lung cancer Father    Cancer Father    Brain cancer Brother    Cancer Brother      Social History Craig Mccann reports that he has been smoking cigarettes. He started smoking about 54 years ago. He has a 40.00 pack-year smoking history. He has never used smokeless tobacco. Craig Mccann reports no history of alcohol use.   Review of Systems CONSTITUTIONAL: No weight loss, fever, chills, weakness or fatigue.  HEENT: Eyes: No visual loss, blurred vision, double vision or yellow sclerae.No hearing loss, sneezing, congestion, runny nose or sore throat.  SKIN: No rash or itching.  CARDIOVASCULAR: per hpi RESPIRATORY: No shortness of breath, cough or sputum.  GASTROINTESTINAL: No anorexia, nausea, vomiting or diarrhea. No abdominal pain or blood.  GENITOURINARY: No burning on urination, no polyuria NEUROLOGICAL: No headache, dizziness, syncope, paralysis, ataxia, numbness or tingling in the extremities. No change in bowel or bladder control.  MUSCULOSKELETAL: No muscle, back pain, joint pain or stiffness.  LYMPHATICS: No enlarged nodes. No history of splenectomy.  PSYCHIATRIC: No history of depression or anxiety.  ENDOCRINOLOGIC: No reports of sweating, cold or heat intolerance. No polyuria or polydipsia.  [name]   Physical Examination Today's Vitals   07/24/22 1441  BP: 122/74  Pulse: 90  SpO2: 95%  Weight: 213 lb 3.2 oz (96.7 kg)  Height: '6\' 1"'$  (1.854 m)   Body mass index is 28.13 kg/m.  Gen: resting comfortably, no acute distress HEENT: no scleral icterus, pupils equal round and reactive, no palptable cervical adenopathy,  CV: RRR, no m/r/g no jvd Resp: Clear to auscultation bilaterally GI: abdomen is soft, non-tender, non-distended, normal bowel sounds, no hepatosplenomegaly MSK: extremities are warm, no edema.  Skin: warm, no  rash Neuro:  no focal deficits Psych: appropriate affect  Diagnostic Studies 06/2017 nuclear stress There was no ST segment deviation noted during stress. Defect 1: There is a medium defect of moderate severity present in the mid inferoseptal and mid inferior location. Findings consistent with prior myocardial infarction with peri-infarct ischemia. Variable soft tissue attenuation artifact is also contributing to defect. This is a low to intermediate risk study. Nuclear stress EF: 46%.   11.2018 echoStudy Conclusions   - Left ventricle: The cavity size was normal. Wall thickness was   increased increased in a pattern of mild to moderate LVH.   Systolic function was mildly reduced. The estimated ejection   fraction was in the range of 45% to 50%. Doppler parameters are   consistent with abnormal left ventricular relaxation (grade 1   diastolic dysfunction). - Aortic valve: Valve area (VTI): 1.97 cm^2. Valve area (Vmax):   2.04 cm^2. Valve area (Vmean): 2 cm^2. - Technically adequate study.   10/2020 cath Ost 1st Diag lesion is 20% stenosed. Dist RCA lesion is 30% stenosed. Ramus lesion is 25% stenosed. Prox Cx to Mid Cx lesion is 65% stenosed. Mid RCA lesion is 15% stenosed.   Mild to moderate nonobstructive CAD with 20% irregularity in the first diagonal Emeri Estill of the LAD, 20% stenosis in the ramus intermediate vessel, smooth 20% proximal circumflex stenosis with no change in the previously noted 65% mid AV groove circumflex stenosis, and mild 15 and 30% smooth narrowings in a dominant RCA.   No significant change since the prior catheterization of 2019.   LVEDP 15 mmHg.   RECOMMENDATION: Guideline directed medical therapy for nonischemic cardiomyopathy with reduced EF at 30 to 35%.  The patient will return to Dr. Harl Bowie.  Smoking cessation is essential.  Aggressive lipid-lowering therapy with target LDL less than 70     10/2020 echo IMPRESSIONS     1. Left ventricular  ejection fraction, by estimation, is 30 to 35%. The  left ventricle has moderately decreased function. The left ventricle  demonstrates global hypokinesis. The left ventricular internal cavity size  was mildly dilated. There is moderate   to severe asymmetric left ventricular hypertrophy of the septal segment.  Left ventricular diastolic parameters are consistent with Grade I  diastolic dysfunction (impaired relaxation).   2. Right ventricular systolic function is normal. The right ventricular  size is normal. There is normal pulmonary artery systolic pressure. The  estimated right ventricular systolic pressure is 40.9 mmHg.   3. There is a trivial pericardial effusion anterior to the right  ventricle.   4. The mitral valve is abnormal. Trivial mitral valve regurgitation.   5. The aortic valve is tricuspid. There is mild calcification of the  aortic valve. Aortic valve regurgitation is not visualized. Mild aortic  valve sclerosis is present, with no evidence of aortic valve stenosis.   6. The inferior vena cava is normal in size with greater than 50%  respiratory variability, suggesting right atrial pressure of 3 mmHg.      12/2021 cath Dist RCA lesion is 30% stenosed.   Mid RCA lesion is 15% stenosed.   Ramus lesion is 25% stenosed.   Ost 1st Diag lesion is 20% stenosed.   Prox Cx to Mid Cx lesion is 50% stenosed.   LV end diastolic pressure is normal.   Nonobstructive CAD. If anything the lesion in the mid LCx is improved from prior. FFR of this lesion in 2018 was normal.  Normal LVEDP   Plan: I don't see anything from a coronary standpoint that would explain  his chest pain symptoms particularly at rest. Would consider noncardiac causes of pain. Continue medical management.    Assessment and Plan  1. Chronic systolic HF - new diagnosis 10/2020 - significant elevation of Cr with just low dose losartan, discontinued. Would avoid ACE/ARB/ARNI.  - on max tolerated regimen. -  repeat limited echo to reeval LVEF. If still qualifies get back in with EP, he had wanted to wait on ICD until his knee replacement which he completed      2. CAD with chronic stable angina - recent cath as reported above - symptoms well controlled. Did not feel good on imdur, stopped taking - continue current meds   3. Hyperlipidemia - has not tolerated atorva or rosuvastatin, will refer to lipid clinic for evaluation.        Arnoldo Lenis, M.D.

## 2022-08-08 ENCOUNTER — Ambulatory Visit: Payer: Medicare HMO | Attending: Cardiology

## 2022-08-08 DIAGNOSIS — I5022 Chronic systolic (congestive) heart failure: Secondary | ICD-10-CM | POA: Diagnosis not present

## 2022-08-08 LAB — ECHOCARDIOGRAM LIMITED
Area-P 1/2: 2.79 cm2
Calc EF: 47.1 %
S' Lateral: 5.1 cm
Single Plane A2C EF: 50.8 %
Single Plane A4C EF: 45.2 %

## 2022-08-11 ENCOUNTER — Ambulatory Visit: Payer: Medicare HMO | Admitting: Cardiology

## 2022-08-21 ENCOUNTER — Telehealth: Payer: Self-pay

## 2022-08-21 NOTE — Progress Notes (Signed)
[place] [facility]) [place]   08/21/2022  Craig Mccann Apr 22, 1951 [id number]  Reason for referral: Medication Adherence  Referral source:  [employer]  Current insurance: [place]  Reason for call: Medication Adherence Measure - for [place]  Outreach:  Unsuccessful telephone call attempt #1 to patient.   HIPAA compliant voicemail left requesting a return call  Plan:  -I will make another outreach attempt to patient within 3-4 business days.  Thank you for allowing [facility] pharmacy to be a part of this patient's care.   [name], PharmD Clinical Pharmacist [place] Cell: [phone]

## 2022-08-25 ENCOUNTER — Telehealth: Payer: Self-pay

## 2022-08-25 DIAGNOSIS — D485 Neoplasm of uncertain behavior of skin: Secondary | ICD-10-CM | POA: Diagnosis not present

## 2022-08-25 DIAGNOSIS — L821 Other seborrheic keratosis: Secondary | ICD-10-CM | POA: Diagnosis not present

## 2022-08-25 NOTE — Progress Notes (Signed)
[place] Team  Medication Adherence Quality Measures  08/25/22  Craig Mccann Nov 04, 1950  Provider:  Dr. Harl Bowie  Practice:  [place]  Medication Adherence Measure(s):   Medication Adherence for Cholesterol Medication - Rosuvastatin Medication Adherence for Diabetes Medication - Farxiga (prescribed by cardiologist) Medication Adherence for Hypertension - Losartan   Medication Adherence Findings:  Per chart review, rosuvastatin caused intolerance yet to CPT associated to encounter.  Per chart review , losartan discontinued d/t elevated Scr while on medication  Per pharmacy claims, [name] was last filled August 2023 for 30 days supply.  Given discussion with Dr. Harl Bowie, via inbasket, patient should remain on Farxiga as a result of systolic heart failure. This pharmacist called the patient today and discussed the following (see below): Explained to patient [name] was last refilled in August for 30 days supply. Craig Mccann is not sure when it was last filled but he believed he has filled [place] since August  He does not know how many pills he has remaining since he was not home at the time this pharmacist called him.   He reports "something is going on with medication" and takes [place] every other day. He feels it "messes up my stomach" and that taking the medication EVERY OTHER DAY (with food) helps with stomach upset. I explained to patient that the medication is prescribed daily with food to get full benefits.   No appointments scheduled with cardiologist Dr. Harl Bowie. Advised patient to contact cardiologist regarding stomach issue.    If clinically appropriate, please consider clinic outreach regarding medication noncompliance of Farxiga for heart failure.   Thank you for allowing pharmacy to be a part of this patient's care. [name], PharmD Clinical Pharmacist [place] Cell: [phone]

## 2022-08-27 NOTE — Telephone Encounter (Signed)
LM for pt to call back to schedule ICD Implant.

## 2022-09-01 ENCOUNTER — Telehealth: Payer: Self-pay | Admitting: *Deleted

## 2022-09-01 NOTE — Telephone Encounter (Signed)
Will send to provider for Echo review.

## 2022-09-01 NOTE — Telephone Encounter (Signed)
-----  Message from April Garrison, [place] sent at 09/01/2022  3:17 PM EST ----- Regarding: Echo I called pt to schedule him for his ICD Implant with Dr. Lovena Le and he doesn't want to schedule at this time. He says he is waiting on his Echo results that he had done on 08/08/22. I see the report in his chart but no documentation that it has been resulted.  He would like a call with his results.   Thanks, April

## 2022-09-01 NOTE — Telephone Encounter (Signed)
I called pt to schedule his ICD Implant. He was concerned about not receiving his Echo results yet. I sent a staff message to [facility] Triage asking them to please call him with these results.   He said he would call back when he was ready to schedule his procedure.

## 2022-09-04 ENCOUNTER — Telehealth: Payer: Self-pay | Admitting: *Deleted

## 2022-09-04 NOTE — Telephone Encounter (Signed)
See result notes. 

## 2022-09-04 NOTE — Telephone Encounter (Signed)
-----  Message from Arnoldo Lenis, MD sent at 09/04/2022  2:46 PM EST ----- Heart function has improved and is nearly back to normal. Can hold off on ICD at this time  Zandra Abts MD

## 2022-09-04 NOTE — Telephone Encounter (Signed)
Laurine Blazer, LPN 11/04/8766  1:15 PM EST Back to Top    Notified, copy to pcp.

## 2022-09-05 NOTE — Telephone Encounter (Signed)
Craig Blazer, LPN  [place], Craig Mccann, [place] Per Dr. Harl Bowie - does not need ICD now. ------------------------------------------------------------------- See his note below -  Heart function has improved and is nearly back to normal. Can hold off on ICD at this time   Zandra Abts MD  Patient has been notified.  Thanks,  Edd Fabian

## 2022-09-09 DIAGNOSIS — H2511 Age-related nuclear cataract, right eye: Secondary | ICD-10-CM | POA: Diagnosis not present

## 2022-09-09 DIAGNOSIS — H2512 Age-related nuclear cataract, left eye: Secondary | ICD-10-CM | POA: Diagnosis not present

## 2022-09-23 DIAGNOSIS — D485 Neoplasm of uncertain behavior of skin: Secondary | ICD-10-CM | POA: Diagnosis not present

## 2022-09-23 DIAGNOSIS — L570 Actinic keratosis: Secondary | ICD-10-CM | POA: Diagnosis not present

## 2022-09-25 DIAGNOSIS — H2512 Age-related nuclear cataract, left eye: Secondary | ICD-10-CM | POA: Diagnosis not present

## 2022-09-25 DIAGNOSIS — H269 Unspecified cataract: Secondary | ICD-10-CM | POA: Diagnosis not present

## 2022-10-09 DIAGNOSIS — H2511 Age-related nuclear cataract, right eye: Secondary | ICD-10-CM | POA: Diagnosis not present

## 2022-10-09 DIAGNOSIS — H269 Unspecified cataract: Secondary | ICD-10-CM | POA: Diagnosis not present

## 2022-10-30 ENCOUNTER — Telehealth: Payer: Self-pay | Admitting: Pharmacist

## 2022-10-30 NOTE — Progress Notes (Signed)
[facility])                                            [place] Team    10/30/2022  Craig Mccann 1951-05-05 [id number]  Patient was called to follow up HIPAA identifiers were obtained.  Patient reported he is no longer taking [place] and  that his Echo results were better than before and that he feels "the best he has in a long time".  Patient said he would discuss [place] with his Provider at the next visit.  No upcoming visits scheduled.  Farxiga 10 mg filled 03/18/22   Plan: Route note to patient's provider.  Elayne Guerin, PharmD, [place] Clinical Pharmacist [phone]

## 2022-10-31 ENCOUNTER — Other Ambulatory Visit: Payer: Self-pay

## 2022-12-05 DIAGNOSIS — N1831 Chronic kidney disease, stage 3a: Secondary | ICD-10-CM | POA: Diagnosis not present

## 2022-12-05 DIAGNOSIS — F17219 Nicotine dependence, cigarettes, with unspecified nicotine-induced disorders: Secondary | ICD-10-CM | POA: Diagnosis not present

## 2022-12-05 DIAGNOSIS — J439 Emphysema, unspecified: Secondary | ICD-10-CM | POA: Diagnosis not present

## 2022-12-05 DIAGNOSIS — I5022 Chronic systolic (congestive) heart failure: Secondary | ICD-10-CM | POA: Diagnosis not present

## 2022-12-05 DIAGNOSIS — Z1330 Encounter for screening examination for mental health and behavioral disorders, unspecified: Secondary | ICD-10-CM | POA: Diagnosis not present

## 2022-12-05 DIAGNOSIS — I25119 Atherosclerotic heart disease of native coronary artery with unspecified angina pectoris: Secondary | ICD-10-CM | POA: Diagnosis not present

## 2022-12-05 DIAGNOSIS — M720 Palmar fascial fibromatosis [Dupuytren]: Secondary | ICD-10-CM | POA: Diagnosis not present

## 2022-12-08 DIAGNOSIS — M65341 Trigger finger, right ring finger: Secondary | ICD-10-CM | POA: Diagnosis not present

## 2022-12-10 DIAGNOSIS — M65341 Trigger finger, right ring finger: Secondary | ICD-10-CM | POA: Diagnosis not present

## 2022-12-10 DIAGNOSIS — M67441 Ganglion, right hand: Secondary | ICD-10-CM | POA: Diagnosis not present

## 2022-12-16 ENCOUNTER — Telehealth: Payer: Self-pay | Admitting: *Deleted

## 2022-12-16 NOTE — Progress Notes (Signed)
  Care Coordination  Outreach Note  12/16/2022 Name: PRIYANSH TRAYER MRN: [id number] DOB: 24-Nov-1950   Care Coordination Outreach Attempts: An unsuccessful telephone outreach was attempted today to offer the patient information about available care coordination services.  Follow Up Plan:  Additional outreach attempts will be made to offer the patient care coordination information and services.   Encounter Outcome:  No Answer  Christie Nottingham  Care Coordination Care Guide  Direct Dial: [phone]

## 2022-12-22 NOTE — Progress Notes (Signed)
  Care Coordination  Outreach Note  12/22/2022 Name: FIORE FRANGELLA MRN: [id number] DOB: Dec 11, 1950   Care Coordination Outreach Attempts: A second unsuccessful outreach was attempted today to offer the patient with information about available care coordination services.  Follow Up Plan:  Additional outreach attempts will be made to offer the patient care coordination information and services.   Encounter Outcome:  No Answer  Christie Nottingham  Care Coordination Care Guide  Direct Dial: [phone]

## 2022-12-24 NOTE — Progress Notes (Signed)
  Care Coordination  Outreach Note  12/24/2022 Name: Craig Mccann MRN: [id number] DOB: 1951-04-07   Care Coordination Outreach Attempts: A third unsuccessful outreach was attempted today to offer the patient with information about available care coordination services.  Follow Up Plan:  No further outreach attempts will be made at this time. We have been unable to contact the patient to offer or enroll patient in care coordination services  Encounter Outcome:  No Answer  Christie Nottingham  Care Coordination Care Guide  Direct Dial: [phone]

## 2023-01-01 ENCOUNTER — Ambulatory Visit: Payer: Medicare HMO | Admitting: Nurse Practitioner

## 2023-01-02 DIAGNOSIS — F17210 Nicotine dependence, cigarettes, uncomplicated: Secondary | ICD-10-CM | POA: Diagnosis not present

## 2023-01-07 DIAGNOSIS — L570 Actinic keratosis: Secondary | ICD-10-CM | POA: Diagnosis not present

## 2023-01-07 DIAGNOSIS — C4442 Squamous cell carcinoma of skin of scalp and neck: Secondary | ICD-10-CM | POA: Diagnosis not present

## 2023-01-07 DIAGNOSIS — D485 Neoplasm of uncertain behavior of skin: Secondary | ICD-10-CM | POA: Diagnosis not present

## 2023-01-07 DIAGNOSIS — L820 Inflamed seborrheic keratosis: Secondary | ICD-10-CM | POA: Diagnosis not present

## 2023-01-20 ENCOUNTER — Ambulatory Visit: Payer: Medicare HMO | Attending: Nurse Practitioner | Admitting: Nurse Practitioner

## 2023-01-20 ENCOUNTER — Other Ambulatory Visit: Payer: Self-pay | Admitting: *Deleted

## 2023-01-20 ENCOUNTER — Encounter: Payer: Self-pay | Admitting: Nurse Practitioner

## 2023-01-20 ENCOUNTER — Encounter: Payer: Self-pay | Admitting: *Deleted

## 2023-01-20 VITALS — BP 124/84 | HR 92 | Ht 73.0 in | Wt 202.8 lb

## 2023-01-20 DIAGNOSIS — R1111 Vomiting without nausea: Secondary | ICD-10-CM | POA: Diagnosis not present

## 2023-01-20 DIAGNOSIS — I77819 Aortic ectasia, unspecified site: Secondary | ICD-10-CM | POA: Diagnosis not present

## 2023-01-20 DIAGNOSIS — R5383 Other fatigue: Secondary | ICD-10-CM | POA: Diagnosis not present

## 2023-01-20 DIAGNOSIS — E782 Mixed hyperlipidemia: Secondary | ICD-10-CM | POA: Diagnosis not present

## 2023-01-20 DIAGNOSIS — I5020 Unspecified systolic (congestive) heart failure: Secondary | ICD-10-CM

## 2023-01-20 DIAGNOSIS — Z720 Tobacco use: Secondary | ICD-10-CM

## 2023-01-20 DIAGNOSIS — Z79899 Other long term (current) drug therapy: Secondary | ICD-10-CM | POA: Diagnosis not present

## 2023-01-20 DIAGNOSIS — I5022 Chronic systolic (congestive) heart failure: Secondary | ICD-10-CM | POA: Diagnosis not present

## 2023-01-20 DIAGNOSIS — Z91148 Patient's other noncompliance with medication regimen for other reason: Secondary | ICD-10-CM

## 2023-01-20 DIAGNOSIS — I2510 Atherosclerotic heart disease of native coronary artery without angina pectoris: Secondary | ICD-10-CM

## 2023-01-20 DIAGNOSIS — R112 Nausea with vomiting, unspecified: Secondary | ICD-10-CM

## 2023-01-20 DIAGNOSIS — I25119 Atherosclerotic heart disease of native coronary artery with unspecified angina pectoris: Secondary | ICD-10-CM

## 2023-01-20 MED ORDER — EZETIMIBE 10 MG PO TABS: 10.0000 mg | ORAL_TABLET | Freq: Every day | ORAL | 6 refills | Status: DC

## 2023-01-20 MED ORDER — CARVEDILOL 3.125 MG PO TABS: 3.1250 mg | ORAL_TABLET | Freq: Two times a day (BID) | ORAL | 6 refills | Status: DC

## 2023-01-20 NOTE — Progress Notes (Unsigned)
Office Visit    Patient Name: Craig Mccann Date of Encounter: 01/20/2023 PCP:  Roe Rutherford, NP   [facility]  Cardiologist:  Dina Rich, MD  Advanced Practice Provider:  No care team member to display Electrophysiologist:  None   Chief Complaint    Craig Mccann is a 72 y.o. male with a hx of CAD, hx of chest pain, HFmrEF(hx of medication noncompliance), tobacco use, aortic dilatation, fatigue, vomiting, and HLD, who presents today for 6 month follow-up.   Past Medical History    Past Medical History:  Diagnosis Date   Acute prostatitis 01/2015   Allergy    Arthritis    Basal cell carcinoma 2017   Multiple: back, left shin, left arm   CAD (coronary artery disease)    a. 07/2017: cath showing 50-60% stenosis along the LCx with a FFR of 0.94 not being hemodynamically significant   CAP (community acquired pneumonia) 06/26/2015   Chronic renal insufficiency, stage II (mild)    stage II/II: CrCl 60s.   Complication of anesthesia    COPD (chronic obstructive pulmonary disease) (HCC) 05/07/2015   Long time smoker + COPD changes noted on lung ca screening CT   Diverticulitis    Encounter for screening for lung cancer    Screening CT ok 08/27/16--repeat in 1 yr recommended.   Family history of colon cancer in mother    Hearing impairment    hearing aids   History of adenomatous polyp of colon 12/24/2010   History of kidney stones    Hyperlipidemia 07/14/2016   started atorv 20 07/16/16   Kidney stones    bilat nonobstrucing renal calculi 2016, + left benign renal cysts   Melanoma (HCC)    R posterolateral neck and R nasal ala.  Most recent was left mid back 01/2016 (Dr. Orvan Falconer).   Spinal headache    Tobacco dependence    CT chest for lung ca screning 04/2015 showed benign findings; 1 yr repeat recommended   Past Surgical History:  Procedure Laterality Date   CERVICAL FUSION     CHOLECYSTECTOMY  1987   COLONOSCOPY  12/24/10; 2014    Multiple TCSs: pt on 5 yr recall, next due 2019.   COLONOSCOPY N/A 10/09/2017   Procedure: COLONOSCOPY;  Surgeon: West Bali, MD;  Location: [facility];  Service: Endoscopy;  Laterality: N/A;  1:30   CORONARY PRESSURE/FFR STUDY N/A 07/14/2017   Procedure: INTRAVASCULAR PRESSURE WIRE/FFR STUDY;  Surgeon: Lyn Records, MD;  Location: [facility] INVASIVE CV LAB;  Service: Cardiovascular;  Laterality: N/A;   INGUINAL HERNIA REPAIR  1984   JOINT REPLACEMENT     left   KNEE SURGERY Left 2015   cartilage repair   KNEE SURGERY Right 1997   "          "   LEFT HEART CATH AND CORONARY ANGIOGRAPHY N/A 07/14/2017   Procedure: LEFT HEART CATH AND CORONARY ANGIOGRAPHY;  Surgeon: Lyn Records, MD;  Location: [facility] INVAS[facility] CV LAB;  Service: Cardiovascular;  Laterality: N/A;   LEFT HEART CATH AND CORONARY ANGIOGRAPHY N/A 10/25/2020   Procedure: LEFT HEART CATH AND CORONARY ANGIOGRAPHY;  Surgeon: Lennette Bihari, MD;  Location: [facility] CV [facility];  Service: Cardiovascular;  Laterality: N/A;   LEFT HEART CATH AND CORONARY ANGIOGRAPHY N/A 12/12/2021   Procedure: LEFT HEART CATH AND CORONARY ANGIOGRAPHY;  Surgeon: Swazil[place], Peter M, MD;  Location: [facility] INVASIVE CV LAB;  Service: Cardiovascular;  Laterality: N/A;  LITHOTRIPSY     NECK SURGERY  1989   Thumb surgery Bilateral 2005   and 2006   TOTAL HIP ARTHROPLASTY Left 11/20/2020   Procedure: TOTAL HIP ARTHROPLASTY ANTERIOR APPROACH;  Surgeon: Sheral Apley, MD;  Location: [facility] ORS;  Service: Orthopedics;  Laterality: Left;   TOTAL KNEE ARTHROPLASTY Left 04/23/2016   Procedure: TOTAL KNEE ARTHROPLASTY;  Surgeon: Loreta Ave, MD;  Location: [facility] OR;  Service: Orthopedics;  Laterality: Left;   TOTAL KNEE ARTHROPLASTY Right 05/27/2022   Procedure: TOTAL KNEE ARTHROPLASTY;  Surgeon: Sheral Apley, MD;  Location: [facility] ORS;  Service: Orthopedics;  Laterality: Right;    Allergies  Allergies  Allergen Reactions   Codeine Hives   Lexiscan [Regadenoson] Other  (See Comments)    Transient Heart Block   Valium [Diazepam] Anaphylaxis   Farxiga [Dapagliflozin] Other (See Comments)    Made pt feel "down".     History of Present Illness    Craig Mccann is a 72 y.o. male with a PMH as mentioned above.   Previous cardiovascular history of NST in 2018 that revealed moderate mid inferoseptal and mid inferior defect with peri-infarct ischemia, EF 46%.  Echocardiogram in 2018 revealed EF 45 to 50%, grade 1 DD.  Underwent cardiac catheterization in 2022 that revealed patent LAD, D1 20%, left circumflex 65 proximal, ramus 25% RCA mid 15%, and distal 30%.  Repeat cardiac catheterization in May 2023 showed stable findings.  Echocardiogram in March 2023 revealed EF 30 to 35%.   Last seen by Dr. Dina Rich on 07/24/2022.  Was doing well at the time.  Limited echo in January 2024 revealed EF 45 to 50%, no WMA, grade 1 DD.  Dr. Wyline Mood stated could hold off on ICD for now.  Patient presents for 12-month follow-up.  He states he has lost a significant weight since the beginning of the year and has lost around 82 lbs. Says he has been watching what he is eating but also has difficulty keeping food down, has choking episodes, and seems to be getting worse over time, says he will speak to PCP regarding this. Admits to fatigue. Has stopped all of his cardiac medication since last visit except for aspirin and Fish Oil. Feels better off the medicine he stopped. Denies any chest pain, shortness of breath, palpitations, syncope, presyncope, dizziness, orthopnea, PND, swelling, acute bleeding, or claudication.  EKGs/Labs/Other Studies Reviewed:   The following studies were reviewed today:   EKG:  EKG is ordered today.  The ekg ordered today demonstrates NSR, 87 bpm, nonspecific T wave abnormality, no acute ischemic changes.   Limited Echo 08/2022:  1. Limited Echo to evaluate for LVEF   2. Left ventricular ejection fraction, by estimation, is 45 to 50%. The  left  ventricle has low normal function. The left ventricle has no regional  wall motion abnormalities. The left ventricular internal cavity size was  mildly to moderately dilated.  Left ventricular diastolic parameters are consistent with Grade I  diastolic dysfunction (impaired relaxation). The average left ventricular  global longitudinal strain is -19.9 %. The global longitudinal strain is  normal.   3. Right ventricular systolic function is normal. The right ventricular  size is normal   Comparison(s): Changes from prior study are noted. LVEF improved from 35%  in 11/2021 to 45-50% now.  LHC 12/2021:    Dist RCA lesion is 30% stenosed.   Mid RCA lesion is 15% stenosed.   Ramus lesion is 25% stenosed.   Suezanne Jacquet  1st Diag lesion is 20% stenosed.   Prox Cx to Mid Cx lesion is 50% stenosed.   LV end diastolic pressure is normal.   Nonobstructive CAD. If anything the lesion in the mid LCx is improved from prior. FFR of this lesion in 2018 was normal.  Normal LVEDP   Plan: I don't see anything from a coronary standpoint that would explain his chest pain symptoms particularly at rest. Would consider noncardiac causes of pain. Continue medical management.  Recent Labs: 05/14/2022: BUN 26; Creatinine, Ser 1.63; Hemoglobin 13.7; Platelets 252; Potassium 4.0; Sodium 140  Recent Lipid Panel    Component Value Date/Time   CHOL 193 05/22/2017 1041   TRIG 87 05/22/2017 1041   HDL 41 05/22/2017 1041   CHOLHDL 4.7 05/22/2017 1041   VLDL 36.6 08/13/2016 1126   LDLCALC 133 (H) 05/22/2017 1041   LDLDIRECT 132.0 07/14/2016 1045    Risk Assessment/Calculations:   The 10-year ASCVD risk score (Arnett DK, et al., 2019) is: 26.3%   Values used to calculate the score:     Age: 84 years     Sex: Male     Is Non-Hispanic African American: No     Diabetic: No     Tobacco smoker: Yes     Systolic Blood Pressure: 124 mmHg     Is BP treated: Yes     HDL Cholesterol: 48 mg/dL     Total Cholesterol: 180  mg/dL   Home Medications   Current Meds  Medication Sig   acetaminophen (TYLENOL) 500 MG tablet Take 2 tablets (1,000 mg total) by mouth every 6 (six) hours as needed for mild pain or moderate pain.   albuterol (PROAIR HFA) 108 (90 BASE) MCG/ACT inhaler Inhale 2 puffs into the lungs every 6 (six) hours as needed for wheezing or shortness of breath.   aspirin EC 81 MG tablet Take 1 tablet (81 mg total) by mouth 2 (two) times daily. For DVT prophylaxis for 30 days after surgery.   Budeson-Glycopyrrol-Formoterol 160-9-4.8 MCG/ACT AERO Inhale 2 puffs into the lungs 2 (two) times daily.   nitroGLYCERIN (NITROSTAT) 0.4 MG SL tablet Place 1 tablet (0.4 mg total) under the tongue every 5 (five) minutes as needed for chest pain.   traZODone (DESYREL) 50 MG tablet Take 50 mg by mouth at bedtime as needed for sleep.    Omega-3 Fatty Acids (OMEGA 3 PO) Take 2 tablets by mouth daily.     Review of Systems    All other systems reviewed and are otherwise negative except as noted above.  Physical Exam    VS:  BP 124/84   Pulse 92   Ht 6\' 1"  (1.854 m)   Wt 202 lb 12.8 oz (92 kg)   SpO2 95%   BMI 26.76 kg/m  , BMI Body mass index is 26.76 kg/m.  Wt Readings from Last 3 Encounters:  01/20/23 202 lb 12.8 oz (92 kg)  07/24/22 213 lb 3.2 oz (96.7 kg)  05/27/22 210 lb 12.8 oz (95.6 kg)     GEN: Well nourished, well developed, in no acute distress. HEENT: normal. Neck: Supple, no JVD, carotid bruits, or masses. Cardiac: S1/S2, RRR, no murmurs, rubs, or gallops. No clubbing, cyanosis, edema.  Radials/PT 2+ and equal bilaterally.  Respiratory:  Respirations regular and unlabored, clear to auscultation bilaterally. GI: Soft, nontender, nondistended. MS: No deformity or atrophy. Skin: Warm and dry, no rash. Neuro:  Strength and sensation are intact. Psych: Normal affect.  Assessment & Plan  CAD Stable with no anginal symptoms. No indication for ischemic evaluation. Continue Aspirin, will  resume Coreg at initial dose, believe previous dose was too strong for him. Continue NTG PRN. Heart healthy diet and regular cardiovascular exercise encouraged. ED precautions discussed.   HFmrEF, medication noncompliance, medication management Stage C, NYHA class I-II symptoms. Echo 08/2022 showed EF 45-50%. Euvolemic and well compensated on exam. Will restart Coreg at 3.125 mg BID. GDMT limited d/t BP's and vomiting - see below. Low sodium diet, fluid restriction <2L, and daily weights encouraged. Educated to contact our office for weight gain of 2 lbs overnight or 5 lbs in one week. Discussed the importance of medication compliance. He verbalized understanding. Will obtain BMET.   HLD Last set of labs with PCP revealed elevated LDL, unable to tolerate statins. Discussed to stop Fish Oil as there is limited clinical benefit. Will being Zetia 10 mg daily and repeat FLP and LFT in 2 months per protocol. Heart healthy diet and regular cardiovascular exercise encouraged.   4. Aortic dilatation Mild dilation of ascending aorta on 11/2021 echo at 42 mm. Moderate diliate of descending aorta measuring 44 mm. Recommended to consider CTA to better evaluate extent of aortic dilatation. Underwent recent CT lung cancer screening with PCP - results showed "Aneurysmal dilatation of the ascending aorta measuring 4.5 cm and the descending aorta measuring 4.3 cm as well as the visualized upper abdominal aorta measuring 3.3 cm." Recommend updating imaging in 6 months - 1 year for further evaluation. Care and ED precautions discussed.   5. Fatigue Etiology multifactorial. Will obtain CBC, BMET, Mag, and thyroid panel. Heart healthy diet and regular cardiovascular exercise encouraged. Continue to follow with PCP.  6. Vomiting Etiology unclear. Recommended to discuss with PCP, would benefit from GI evaluation. He verbalized understanding. Checking labs as mentioned above.  7. Tobacco use Smoking cessation encouraged and  discussed.    Disposition: Follow up in 6 month(s) with Dina Rich, MD or APP.  Signed, Sharlene Dory, NP 01/21/2023, 5:03 PM [facility]

## 2023-01-20 NOTE — Patient Instructions (Signed)
Medication Instructions:   Stop Fish Oil  Begin Zetia 10mg  daily  Decrease Coreg to 3.125mg  twice a day   Continue all other medications.     Labwork:  CBC, BMET, Mg, Thyroid Panel - due now FLP, LFT - due in 2 months  Office will contact with results via phone, letter or mychart.     Testing/Procedures:  none  Follow-Up:  6 months   Any Other Special Instructions Will Be Listed Below (If Applicable).   If you need a refill on your cardiac medications before your next appointment, please call your pharmacy.

## 2023-02-09 NOTE — Patient Instructions (Signed)
Patient is counseled regarding the importance of long term risk factor modification as they pertain to the presence of ischemic heart disease including avoiding the use of all tobacco products, dietary modifications and medical therapy for diabetes, cholesterol and lipid management, and regular exercise.     Make every effort to stay physically active, get some type of exercise on a regular basis, and stick to a "heart healthy diet".  The long term benefits for regular exercise and a healthy diet are critically important to your overall health and wellbeing.   AVOID FLOUROQUINOLONES ( ex. Ciprofloxacin) as this class of medications can increase your risk of Aortic Dissection

## 2023-02-09 NOTE — Progress Notes (Unsigned)
[address]       [place] [address]             [phone]        ORVEL MACIOCE [id number] [birth year]  History of Present Illness:  Craig Mccann is a 72 yo male with history of CAD, HLD, Tobacco Abuse, H/O skin cancer, COPD and Ascending Aortic Aneurysm.  He is being referred to our office to establish surgical surveillance.  In May he underwent a low dose lung cancer screening CT scan which showed no evidence of suspicious nodules but he was noted to have an Aneurysm at 4.5 cm.   Current Outpatient Medications on File Prior to Visit  Medication Sig Dispense Refill   acetaminophen (TYLENOL) 500 MG tablet Take 2 tablets (1,000 mg total) by mouth every 6 (six) hours as needed for mild pain or moderate pain. 60 tablet 0   albuterol (PROAIR HFA) 108 (90 BASE) MCG/ACT inhaler Inhale 2 puffs into the lungs every 6 (six) hours as needed for wheezing or shortness of breath. 1 Inhaler 1   aspirin EC 81 MG tablet Take 1 tablet (81 mg total) by mouth 2 (two) times daily. For DVT prophylaxis for 30 days after surgery. 60 tablet 0   Budeson-Glycopyrrol-Formoterol 160-9-4.8 MCG/ACT AERO Inhale 2 puffs into the lungs 2 (two) times daily.     carvedilol (COREG) 3.125 MG tablet Take 1 tablet (3.125 mg total) by mouth 2 (two) times daily. 60 tablet 6   ezetimibe (ZETIA) 10 MG tablet Take 1 tablet (10 mg total) by mouth daily. 30 tablet 6   nitroGLYCERIN (NITROSTAT) 0.4 MG SL tablet Place 1 tablet (0.4 mg total) under the tongue every 5 (five) minutes as needed for chest pain. 25 tablet 3   traZODone (DESYREL) 50 MG tablet Take 50 mg by mouth at bedtime as needed for sleep.     No current facility-administered medications on file prior to visit.     There were no vitals taken for this visit.  Physical Exam  CTA Results:   FINDINGS:   Lungs and pleura:  Patent central airways. No pleural effusion or pneumothorax. Emphysema.  Scattered calcified granulomas. Small  juxtapleural nodules..   Mediastinum/Soft Tissues:  Mild to moderate coronary calcifications. No adenopathy. Aneurysmal  dilatation of the ascending aorta measuring 4.5 cm and the descending  aorta measuring 4.3 cm as well as the visualized upper abdominal aorta  measuring 3.3 cm. Atherosclerosis is present.   Upper Abdomen:  Partially imaged left renal cyst, poorly evaluated   Bones:  No acute or aggressive bony abnormality. Thoracic DISH   Nodule assessment:  No suspicious lung nodules.      IMPRESSION:   1. No suspicious pulmonary nodules.  2. Aneurysmal dilatation of the ascending, descending thoracic aorta and  the visualized upper abdominal aorta. Recommend to outside comparisons,  images not available to evaluate for progression, and clinical follow-up   RECOMMENDATIONS:  Continue annual Screening low dose chest CT (exam code IMG [phone]).   ASSESSMENT: Lung-RADSTM CATEGORY: 2-Benign appearance or behavior   Smoking cessation resources: 1-800-QUIT-NOW and smokefree.gov   Electronically Signed by: Ellin Goodie, MD on 01/05/2023 9:55 AM    A/P:  Ascending Aortic Aneurysm- 4.5 cm incidentally found on lung cancer CT scan.. will get repeat scan in November  CAD, with chest pain.[name] workup shows no evidence of obstructive CAD or source for chest pain HLD COPD Nicotine Abuse Vomiting- would benefit from GI workup  Risk Modification:  Statin:  No, encouraged patient to discuss need for statin with PCP  Smoking cessation instruction/counseling given:  counseled patient on the dangers of tobacco use, advised patient to stop smoking, and reviewed strategies to maximize success  Patient was counseled on importance of Blood Pressure Control.  Despite Medical intervention if the patient notices persistently elevated blood pressure readings.  They are instructed to contact their Primary Care Physician  Please avoid use of Fluoroquinolones as this can potentially increase your  risk of Aortic Rupture and/or Dissection  Patient educated on signs and symptoms of Aortic Dissection, handout also provided in AVS  Craig Sanmiguel, PA-C 02/09/23

## 2023-02-12 ENCOUNTER — Institutional Professional Consult (permissible substitution): Payer: Medicare HMO | Admitting: Physician Assistant

## 2023-02-12 VITALS — BP 109/72 | HR 82 | Resp 20 | Ht 73.0 in | Wt 200.0 lb

## 2023-02-12 DIAGNOSIS — I7121 Aneurysm of the ascending aorta, without rupture: Secondary | ICD-10-CM

## 2023-02-19 DIAGNOSIS — Z79899 Other long term (current) drug therapy: Secondary | ICD-10-CM | POA: Diagnosis not present

## 2023-02-19 DIAGNOSIS — I25119 Atherosclerotic heart disease of native coronary artery with unspecified angina pectoris: Secondary | ICD-10-CM | POA: Diagnosis not present

## 2023-02-19 DIAGNOSIS — R112 Nausea with vomiting, unspecified: Secondary | ICD-10-CM | POA: Diagnosis not present

## 2023-02-19 DIAGNOSIS — I5022 Chronic systolic (congestive) heart failure: Secondary | ICD-10-CM | POA: Diagnosis not present

## 2023-02-20 LAB — BASIC METABOLIC PANEL WITH GFR
BUN/Creatinine Ratio: 14 (calc) (ref 6–22)
BUN: 22 mg/dL (ref 7–25)
CO2: 25 mmol/L (ref 20–32)
Calcium: 9.7 mg/dL (ref 8.6–10.3)
Chloride: 103 mmol/L (ref 98–110)
Creat: 1.6 mg/dL — ABNORMAL HIGH (ref 0.70–1.28)
Glucose, Bld: 118 mg/dL — ABNORMAL HIGH (ref 65–99)
Potassium: 4.4 mmol/L (ref 3.5–5.3)
Sodium: 138 mmol/L (ref 135–146)

## 2023-02-20 LAB — CBC
HCT: 43.5 % (ref 38.5–50.0)
Hemoglobin: 14.4 g/dL (ref 13.2–17.1)
MCH: 30.8 pg (ref 27.0–33.0)
MCHC: 33.1 g/dL (ref 32.0–36.0)
MCV: 92.9 fL (ref 80.0–100.0)
MPV: 10.5 fL (ref 7.5–12.5)
Platelets: 266 Thousand/uL (ref 140–400)
RBC: 4.68 Million/uL (ref 4.20–5.80)
RDW: 12.9 % (ref 11.0–15.0)
WBC: 7.4 Thousand/uL (ref 3.8–10.8)

## 2023-02-20 LAB — THYROID PANEL WITH TSH
Free Thyroxine Index: 2.7 (ref 1.4–3.8)
T3 Uptake: 32 % (ref 22–35)
T4, Total: 8.3 ug/dL (ref 4.9–10.5)
TSH: 1.11 mIU/L (ref 0.40–4.50)

## 2023-02-20 LAB — MAGNESIUM: Magnesium: 2 mg/dL (ref 1.5–2.5)

## 2023-03-24 ENCOUNTER — Encounter: Payer: Self-pay | Admitting: *Deleted

## 2023-03-24 ENCOUNTER — Other Ambulatory Visit (HOSPITAL_BASED_OUTPATIENT_CLINIC_OR_DEPARTMENT_OTHER): Payer: Self-pay | Admitting: Nurse Practitioner

## 2023-03-24 DIAGNOSIS — C44319 Basal cell carcinoma of skin of other parts of face: Secondary | ICD-10-CM | POA: Diagnosis not present

## 2023-03-24 DIAGNOSIS — L570 Actinic keratosis: Secondary | ICD-10-CM | POA: Diagnosis not present

## 2023-03-24 DIAGNOSIS — D485 Neoplasm of uncertain behavior of skin: Secondary | ICD-10-CM | POA: Diagnosis not present

## 2023-03-24 DIAGNOSIS — L98499 Non-pressure chronic ulcer of skin of other sites with unspecified severity: Secondary | ICD-10-CM | POA: Diagnosis not present

## 2023-03-24 DIAGNOSIS — Z79899 Other long term (current) drug therapy: Secondary | ICD-10-CM | POA: Diagnosis not present

## 2023-03-24 DIAGNOSIS — I25119 Atherosclerotic heart disease of native coronary artery with unspecified angina pectoris: Secondary | ICD-10-CM | POA: Diagnosis not present

## 2023-03-24 DIAGNOSIS — E782 Mixed hyperlipidemia: Secondary | ICD-10-CM | POA: Diagnosis not present

## 2023-03-25 LAB — HEPATIC FUNCTION PANEL
AG Ratio: 2.3 (calc) (ref 1.0–2.5)
ALT: 7 U/L — ABNORMAL LOW (ref 9–46)
AST: 9 U/L — ABNORMAL LOW (ref 10–35)
Albumin: 4.3 g/dL (ref 3.6–5.1)
Alkaline phosphatase (APISO): 49 U/L (ref 35–144)
Bilirubin, Direct: 0.1 mg/dL (ref 0.0–0.2)
Globulin: 1.9 g/dL (ref 1.9–3.7)
Indirect Bilirubin: 0.3 mg/dL (ref 0.2–1.2)
Total Bilirubin: 0.4 mg/dL (ref 0.2–1.2)
Total Protein: 6.2 g/dL (ref 6.1–8.1)

## 2023-03-25 LAB — LIPID PANEL
Cholesterol: 164 mg/dL (ref <200)
HDL: 50 mg/dL (ref >=40)
LDL Cholesterol (Calc): 94 mg/dL
Non-HDL Cholesterol (Calc): 114 mg/dL (ref <130)
Total CHOL/HDL Ratio: 3.3 (calc) (ref <5.0)
Triglycerides: 103 mg/dL (ref <150)

## 2023-03-26 ENCOUNTER — Telehealth: Payer: Self-pay

## 2023-03-26 NOTE — Telephone Encounter (Signed)
-----   Message from Sharlene Dory sent at 03/25/2023  4:44 PM EDT ----- Labs look good. No medication changes at this time.   Thanks!  Sharlene Dory, AGNP-C

## 2023-03-26 NOTE — Telephone Encounter (Signed)
Patient informed and verbalized understanding. Copy sent to PCP 

## 2023-04-02 DIAGNOSIS — C44319 Basal cell carcinoma of skin of other parts of face: Secondary | ICD-10-CM | POA: Diagnosis not present

## 2023-06-05 DIAGNOSIS — R202 Paresthesia of skin: Secondary | ICD-10-CM | POA: Diagnosis not present

## 2023-06-05 DIAGNOSIS — W57XXXS Bitten or stung by nonvenomous insect and other nonvenomous arthropods, sequela: Secondary | ICD-10-CM | POA: Diagnosis not present

## 2023-06-05 DIAGNOSIS — R634 Abnormal weight loss: Secondary | ICD-10-CM | POA: Diagnosis not present

## 2023-06-05 DIAGNOSIS — R112 Nausea with vomiting, unspecified: Secondary | ICD-10-CM | POA: Diagnosis not present

## 2023-06-05 DIAGNOSIS — R531 Weakness: Secondary | ICD-10-CM | POA: Diagnosis not present

## 2023-06-18 DIAGNOSIS — L570 Actinic keratosis: Secondary | ICD-10-CM | POA: Diagnosis not present

## 2023-06-18 DIAGNOSIS — D485 Neoplasm of uncertain behavior of skin: Secondary | ICD-10-CM | POA: Diagnosis not present

## 2023-07-09 DIAGNOSIS — C44622 Squamous cell carcinoma of skin of right upper limb, including shoulder: Secondary | ICD-10-CM | POA: Diagnosis not present

## 2023-07-14 ENCOUNTER — Other Ambulatory Visit: Payer: Self-pay | Admitting: Surgery

## 2023-07-14 DIAGNOSIS — I7121 Aneurysm of the ascending aorta, without rupture: Secondary | ICD-10-CM

## 2023-07-23 ENCOUNTER — Encounter: Payer: Self-pay | Admitting: Nurse Practitioner

## 2023-07-23 ENCOUNTER — Ambulatory Visit: Payer: Medicare HMO | Attending: Nurse Practitioner | Admitting: Nurse Practitioner

## 2023-07-23 VITALS — BP 122/70 | HR 68 | Ht 73.5 in | Wt 210.0 lb

## 2023-07-23 DIAGNOSIS — Z720 Tobacco use: Secondary | ICD-10-CM

## 2023-07-23 DIAGNOSIS — I77819 Aortic ectasia, unspecified site: Secondary | ICD-10-CM

## 2023-07-23 DIAGNOSIS — I5022 Chronic systolic (congestive) heart failure: Secondary | ICD-10-CM

## 2023-07-23 DIAGNOSIS — Z91148 Patient's other noncompliance with medication regimen for other reason: Secondary | ICD-10-CM

## 2023-07-23 DIAGNOSIS — I2510 Atherosclerotic heart disease of native coronary artery without angina pectoris: Secondary | ICD-10-CM

## 2023-07-23 DIAGNOSIS — E785 Hyperlipidemia, unspecified: Secondary | ICD-10-CM

## 2023-07-23 NOTE — Patient Instructions (Addendum)

## 2023-07-23 NOTE — Progress Notes (Signed)
Office Visit    Patient Name: Craig Mccann Date of Encounter: 07/23/2023 PCP:  Roe Rutherford, NP [facility]  Cardiologist:  Dina Rich, MD  Advanced Practice Provider:  No care team member to display Electrophysiologist:  None   Chief Complaint and HPI    Craig Mccann is a 72 y.o. male with a hx of CAD, hx of chest pain, HFmrEF(hx of medication noncompliance), tobacco use, aortic dilatation, Alpha Gal syndrome, and HLD, who presents today for 6 month follow-up.   Previous cardiovascular history of NST in 2018 that revealed moderate mid inferoseptal and mid inferior defect with peri-infarct ischemia, EF 46%.  Echocardiogram in 2018 revealed EF 45 to 50%, grade 1 DD.  Underwent cardiac catheterization in 2022 that revealed patent LAD, D1 20%, left circumflex 65 proximal, ramus 25% RCA mid 15%, and distal 30%.  Repeat cardiac catheterization in May 2023 showed stable findings.  Echocardiogram in March 2023 revealed EF 30 to 35%.   Last seen by Dr. Dina Rich on 07/24/2022.  Was doing well at the time.  Limited echo in January 2024 revealed EF 45 to 50%, no WMA, grade 1 DD.  Dr. Wyline Mood stated could hold off on ICD for now.  I last saw him for 56-month follow-up on January 20, 2023. Had lost a significant weight since the beginning of the year and lost around 82 lbs. Was watching what he is eating but also had difficulty keeping food down, has choking episodes, and seemed to be getting worse over time. Admits to fatigue. Had stopped all of his cardiac medication since last visit except for aspirin and Fish Oil. Felt better off the medicine he stopped. Denied any chest pain, shortness of breath, palpitations, syncope, presyncope, dizziness, orthopnea, PND, swelling, acute bleeding, or claudication.  Today he presents for follow-up. Says he was diagnosed with Alpha-Gal syndrome. Feels better now that he understands what foods he cannot consume. Overall  doing very well from  a cardiac perspective. Remains compliant with his current medications. Denies any chest pain, shortness of breath, palpitations, syncope, presyncope, dizziness, orthopnea, PND, swelling or significant weight changes, acute bleeding, or claudication.  EKGs/Labs/Other Studies Reviewed:   The following studies were reviewed today:   EKG:  EKG is not ordered today.    Limited Echo 08/2022:  1. Limited Echo to evaluate for LVEF   2. Left ventricular ejection fraction, by estimation, is 45 to 50%. The  left ventricle has low normal function. The left ventricle has no regional  wall motion abnormalities. The left ventricular internal cavity size was  mildly to moderately dilated.  Left ventricular diastolic parameters are consistent with Grade I  diastolic dysfunction (impaired relaxation). The average left ventricular  global longitudinal strain is -19.9 %. The global longitudinal strain is  normal.   3. Right ventricular systolic function is normal. The right ventricular  size is normal   Comparison(s): Changes from prior study are noted. LVEF improved from 35%  in 11/2021 to 45-50% now.  LHC 12/2021:    Dist RCA lesion is 30% stenosed.   Mid RCA lesion is 15% stenosed.   Ramus lesion is 25% stenosed.   Ost 1st Diag lesion is 20% stenosed.   Prox Cx to Mid Cx lesion is 50% stenosed.   LV end diastolic pressure is normal.   Nonobstructive CAD. If anything the lesion in the mid LCx is improved from prior. FFR of this lesion in 2018 was normal.  Normal LVEDP  Plan: I don't see anything from a coronary standpoint that would explain his chest pain symptoms particularly at rest. Would consider noncardiac causes of pain. Continue medical management.  Recent Labs: 02/19/2023: BUN 22; Creat 1.60; Hemoglobin 14.4; Magnesium 2.0; Platelets 266; Potassium 4.4; Sodium 138; TSH 1.11 03/24/2023: ALT 7  Recent Lipid Panel    Component Value Date/Time   CHOL 164 03/24/2023  0736   TRIG 103 03/24/2023 0736   HDL 50 03/24/2023 0736   CHOLHDL 3.3 03/24/2023 0736   VLDL 36.6 08/13/2016 1126   LDLCALC 94 03/24/2023 0736   LDLDIRECT 132.0 07/14/2016 1045    Risk Assessment/Calculations:   The 10-year ASCVD risk score (Arnett DK, et al., 2019) is: 24.5%   Values used to calculate the score:     Age: 11 years     Sex: Male     Is Non-Hispanic African American: No     Diabetic: No     Tobacco smoker: Yes     Systolic Blood Pressure: 122 mmHg     Is BP treated: Yes     HDL Cholesterol: 50 mg/dL     Total Cholesterol: 164 mg/dL  Review of Systems    All other systems reviewed and are otherwise negative except as noted above.  Physical Exam    VS:  BP 122/70   Pulse 68   Ht 6' 1.5" (1.867 m)   Wt 210 lb (95.3 kg)   SpO2 98%   BMI 27.33 kg/m  , BMI Body mass index is 27.33 kg/m.  Wt Readings from Last 3 Encounters:  07/23/23 210 lb (95.3 kg)  02/12/23 200 lb (90.7 kg)  01/20/23 202 lb 12.8 oz (92 kg)     GEN: Well nourished, well developed, in no acute distress. HEENT: normal. Neck: Supple, no JVD, carotid bruits, or masses. Cardiac: S1/S2, RRR, no murmurs, rubs, or gallops. No clubbing, cyanosis, edema.  Radials/PT 2+ and equal bilaterally.  Respiratory:  Respirations regular and unlabored, clear to auscultation bilaterally. GI: Soft, nontender, nondistended. MS: No deformity or atrophy. Skin: Warm and dry, no rash. Neuro:  Strength and sensation are intact. Psych: Normal affect.  Assessment & Plan   HFmrEF, medication noncompliance Stage C, NYHA class I-II symptoms. Echo 08/2022 showed EF 45-50%. Euvolemic and well compensated on exam. GDMT limited d/t BP's in the past. Low sodium diet, fluid restriction <2L, and daily weights encouraged. Educated to contact our office for weight gain of 2 lbs overnight or 5 lbs in one week. Discussed the importance of medication compliance. Continue current medication regimen. Discussed SGLT2i - however pt  would like to wait on this d/t past hx of vomiting/ weight loss.   CAD Stable with no anginal symptoms. No indication for ischemic evaluation. Continue Aspirin and Coreg. Continue NTG PRN. Heart healthy diet and regular cardiovascular exercise encouraged. ED precautions discussed.   HLD Past LDL 94 03/2023. Continue Zetia. Heart healthy diet and regular cardiovascular exercise encouraged.   4. Aortic dilatation Mild dilation of ascending aorta on 11/2021 echo at 42 mm. Moderate diliate of descending aorta measuring 44 mm. Recommended to consider CTA to better evaluate extent of aortic dilatation. Underwent CT lung cancer screening with PCP - results showed "Aneurysmal dilatation of the ascending aorta measuring 4.5 cm and the descending aorta measuring 4.3 cm as well as the visualized upper abdominal aorta measuring 3.3 cm." Recommend updating imaging in 6 months - 1 year for further evaluation. He is scheduled for CT angio on 08/20/2023, will f/u  with cardiothoracic surgery. Care and ED precautions discussed.   5. Tobacco use Smoking cessation encouraged and discussed.    Disposition: Follow up in 6 month(s) with Dina Rich, MD or APP.  Signed, Sharlene Dory, NP 07/23/2023, 2:56 PM [facility]

## 2023-07-27 ENCOUNTER — Encounter: Payer: Self-pay | Admitting: Surgery

## 2023-08-03 ENCOUNTER — Other Ambulatory Visit: Payer: Self-pay | Admitting: Cardiology

## 2023-08-06 DIAGNOSIS — C44629 Squamous cell carcinoma of skin of left upper limb, including shoulder: Secondary | ICD-10-CM | POA: Diagnosis not present

## 2023-08-07 NOTE — Progress Notes (Signed)
[address]       [place] [address]             [phone]    PCP is Roe Rutherford, NP Referring Provider is Roe Rutherford, NP  Chief Complaint: Ascending thoracic aortic aneurysm   HPI: This is a 73 year old male with a past medical history of hypertension, COPD, CAD, hyperlipidemia, CKD (stage II), and tobacco abuse who presents today for further surveillance of his ascending thoracic aortic aneurysm. He was last seen by by colleague July 2024 and the ATAA measured 4.5 cm. He states he did have an episode of chest pain after walking a few months ago. He has Nitroglycerin and this helps. He was instructed if chest pain continues, worsens, increases in frequency or has shortness of breath etc, he needs to seek medical attention.  Past Medical History:  Diagnosis Date   Acute prostatitis 01/2015   Allergy    Arthritis    Basal cell carcinoma 2017   Multiple: back, left shin, left arm   CAD (coronary artery disease)    a. 07/2017: cath showing 50-60% stenosis along the LCx with a FFR of 0.94 not being hemodynamically significant   CAP (community acquired pneumonia) 06/26/2015   Chronic renal insufficiency, stage II (mild)    stage II/II: CrCl 60s.   Complication of anesthesia    COPD (chronic obstructive pulmonary disease) (HCC) 05/07/2015   Long time smoker + COPD changes noted on lung ca screening CT   Diverticulitis    Encounter for screening for lung cancer    Screening CT ok 08/27/16--repeat in 1 yr recommended.   Family history of colon cancer in mother    Hearing impairment    hearing aids   History of adenomatous polyp of colon 12/24/2010   History of kidney stones    Hyperlipidemia 07/14/2016   started atorv 20 07/16/16   Kidney stones    bilat nonobstrucing renal calculi 2016, + left benign renal cysts   Melanoma (HCC)    R posterolateral neck and R nasal ala.  Most recent was left mid back 01/2016 (Dr. Orvan Falconer).   Spinal headache     Tobacco dependence    CT chest for lung ca screning 04/2015 showed benign findings; 1 yr repeat recommended    Past Surgical History:  Procedure Laterality Date   CERVICAL FUSION     CHOLECYSTECTOMY  1987   COLONOSCOPY  12/24/10; 2014   Multiple TCSs: pt on 5 yr recall, next due 2019.   COLONOSCOPY N/A 10/09/2017   Procedure: COLONOSCOPY;  Surgeon: West Bali, MD;  Location: [facility];  Service: Endoscopy;  Laterality: N/A;  1:30   CORONARY PRESSURE/FFR STUDY N/A 07/14/2017   Procedure: INTRAVASCULAR PRESSURE WIRE/FFR STUDY;  Surgeon: Lyn Records, MD;  Location: [facility] INVASIVE CV LAB;  Service: Cardiovascular;  Laterality: N/A;   INGUINAL HERNIA REPAIR  1984   JOINT REPLACEMENT     left   KNEE SURGERY Left 2015   cartilage repair   KNEE SURGERY Right 1997   "          "   LEFT HEART CATH AND CORONARY ANGIOGRAPHY N/A 07/14/2017   Procedure: LEFT HEART CATH AND CORONARY ANGIOGRAPHY;  Surgeon: Lyn Records, MD;  Location: [facility] INVAS[facility] CV LAB;  Service: Cardiovascular;  Laterality: N/A;   LEFT HEART CATH AND CORONARY ANGIOGRAPHY N/A 10/25/2020   Procedure: LEFT HEART CATH AND CORONARY ANGIOGRAPHY;  Surgeon: Nicki Guadalajara  A, MD;  Location: [facility] INV[facility] CV LAB;  Service: Cardiovascular;  Laterality: N/A;   LEFT HEART CATH AND CORONARY ANGIOGRAPHY N/A 12/12/2021   Procedure: LEFT HEART CATH AND CORONARY ANGIOGRAPHY;  Surgeon: [place]az[place], Peter M, MD;  Location: [facility] INVASIVE CV LAB;  Service: Cardiovascular;  Laterality: N/A;   LITHOTRIPSY     NECK SURGERY  1989   Thumb surgery Bilateral 2005   and 2006   TOTAL HIP ARTHROPLASTY Left 11/20/2020   Procedure: TOTAL HIP ARTHROPLASTY ANTERIOR APPROACH;  Surgeon: Sheral Apley, MD;  Location: [facility];  Service: Orthopedics;  Laterality: Left;   TOTAL KNEE ARTHROPLASTY Left 04/23/2016   Procedure: TOTAL KNEE ARTHROPLASTY;  Surgeon: Loreta Ave, MD;  Location: [facility] OR;  Service: Orthopedics;  Laterality: Left;   TOTAL KNEE ARTHROPLASTY Right  05/27/2022   Procedure: TOTAL KNEE ARTHROPLASTY;  Surgeon: Sheral Apley, MD;  Location: [facility];  Service: Orthopedics;  Laterality: Right;    Family History  Problem Relation Age of Onset   Colon cancer Mother    Cancer Mother    Lung cancer Father    Cancer Father    Brain cancer Brother    Cancer Brother     Social History Social History   Tobacco Use   Smoking status: Every Day    Current packs/day: 1.00    Average packs/day: 1 pack/day for 55.0 years (55.0 ttl pk-yrs)    Types: Cigarettes    Start date: 08/04/1968   Smokeless tobacco: Never   Tobacco comments:    1 pack every 2-3 days - 01/20/2023  Vaping Use   Vaping status: Never Used  Substance Use Topics   Alcohol use: No    Alcohol/week: 0.0 standard drinks of alcohol   Drug use: No    Current Outpatient Medications  Medication Sig Dispense Refill   acetaminophen (TYLENOL) 500 MG tablet Take 2 tablets (1,000 mg total) by mouth every 6 (six) hours as needed for mild pain or moderate pain. 60 tablet 0   albuterol (PROAIR HFA) 108 (90 BASE) MCG/ACT inhaler Inhale 2 puffs into the lungs every 6 (six) hours as needed for wheezing or shortness of breath. 1 Inhaler 1   aspirin EC 81 MG tablet Take 1 tablet (81 mg total) by mouth 2 (two) times daily. For DVT prophylaxis for 30 days after surgery. 60 tablet 0   Budeson-Glycopyrrol-Formoterol 160-9-4.8 MCG/ACT AERO Inhale 2 puffs into the lungs 2 (two) times daily.     carvedilol (COREG) 3.125 MG tablet Take 1 tablet (3.125 mg total) by mouth 2 (two) times daily. 60 tablet 6   ezetimibe (ZETIA) 10 MG tablet Take 1 tablet (10 mg total) by mouth daily. 30 tablet 6   nitroGLYCERIN (NITROSTAT) 0.4 MG SL tablet DISSOLVE 1 TABLET UNDER THE TONGUE EVERY 5 MINUTES AS NEEDED FOR CHEST PAIN. DO NOT EXCEED A TOTAL OF 3 DOSES IN 15 MINUTES. 25 tablet 3   traZODone (DESYREL) 50 MG tablet Take 50 mg by mouth at bedtime as needed for sleep.     Allergies  Allergen Reactions   Codeine  Hives   Lexiscan [Regadenoson] Other (See Comments)    Transient Heart Block   Valium [Diazepam] Anaphylaxis   Farxiga [Dapagliflozin] Other (See Comments)    Made pt feel "down".     Vital Signs: Vitals:   08/20/23 1313  BP: (!) 143/87  Pulse: 72  Resp: 20  SpO2: 96%     Physical Exam: CV-RRR Neck-No carotid bruit Pulmonary-Clear to  auscultation bilaterally Abdomen-Soft, non tender, bowel sounds present Extremities-No LE edema Neurologic-Grossly intact without focal deficit   Diagnostic Tests:  Narrative & Impression  CLINICAL DATA:  Aneurysm assessment, COPD.  History of melanoma.   EXAM: CT ANGIOGRAPHY CHEST WITH CONTRAST   TECHNIQUE: Multidetector CT imaging of the chest was performed using the standard protocol during bolus administration of intravenous contrast. Multiplanar CT image reconstructions and MIPs were obtained to evaluate the vascular anatomy.   RADIATION DOSE REDUCTION: This exam was performed according to the departmental dose-optimization program which includes automated exposure control, adjustment of the mA and/or kV according to patient size and/or use of iterative reconstruction technique.   CONTRAST:  70mL ISOVUE-370 IOPAMIDOL (ISOVUE-370) INJECTION 76%   COMPARISON:  Radiographs 04/15/2021 and CT scan 08/27/2016   FINDINGS: Cardiovascular: The exam was timed for systemic arterial opacification.   Ascending thoracic aortic aneurysm 4.2 cm in diameter on image 207 series 9, previously 4.1 cm on 08/27/2016. No dissection or acute aortic findings.   Atheromatous vascular calcification in the thoracic aorta, branch vessels, and left anterior descending, circumflex, and right coronary arteries.   Descending thoracic aortic caliber at about 3.3 cm although just above the hiatus this expands up to 4.5 cm compatible with descending thoracic aortic aneurysm.   Mediastinum/Nodes: Unremarkable   Lungs/Pleura: Centrilobular emphysema.  Airway thickening is present, suggesting bronchitis or reactive airways disease. Chronic lingular scarring. Mild scarring or subsegmental atelectasis along the left hemidiaphragm in the left lower lobe.   Upper Abdomen: On the bottom most image there is slight lobularity of the left mid kidney anterolaterally on image 124 series 4, this could represent a top of a solid left renal mass although admittedly may be incidental. The appearance is concerning enough that I recommend follow up dedicated renal protocol MRI or CT to exclude renal cell carcinoma/renal mass.   Musculoskeletal: Degenerative glenohumeral arthropathy bilaterally. Thoracic spondylosis with multilevel bridging spurring. T11 vertebral body hemangioma.   Review of the MIP images confirms the above findings.   IMPRESSION: 1. Ascending thoracic aortic aneurysm 4.2 cm in diameter, previously 4.1 cm on 08/27/2016. There is also aneurysmal dilatation of the descending thoracic aorta at the hiatus. Recommend annual imaging followup by CTA or MRA. This recommendation follows 2010 ACCF/AHA/AATS/ACR/ASA/SCA/SCAI/SIR/STS/SVM Guidelines for the Diagnosis and Management of Patients with Thoracic Aortic Disease. Circulation. 2010; 121: Z610-R604. Aortic aneurysm NOS (ICD10-I71.9) 2. On the bottom most image there is slight lobularity of the left mid kidney anterolaterally, this could represent a top of a solid left renal mass although admittedly may be incidental. The appearance is concerning enough that I recommend follow up dedicated renal protocol MRI or CT to exclude renal cell carcinoma/renal mass. 3. Aortic and coronary artery atherosclerosis. 4. Centrilobular emphysema. 5. Airway thickening is present, suggesting bronchitis or reactive airways disease. 6. Degenerative glenohumeral arthropathy bilaterally. 7. Thoracic spondylosis with multilevel bridging spurring.   Aortic Atherosclerosis (ICD10-I70.0) and Emphysema  (ICD10-J43.9).     Electronically Signed   By: Gaylyn Rong M.D.   On: 08/20/2023 13:32     Impression and Plan: CTA with a 4.2 cm ascending aortic aneurysm.  Of note, there was a lobularity on left mid kidney (?incidental or mass). Patient will follow up with medical doctor to see if needs any further imaging etc. Echo done January 2024 showed LVEF 45-50% and aortic valve is tricuspid. Echo done in March 2023 showed no aortic insufficiency or stenosis. We discussed the natural history and and risk factors for growth of  ascending aortic aneurysms.  We covered the importance of smoking cessation, tight blood pressure control, refraining from lifting heavy objects, and avoiding fluoroquinolones.  The patient is aware of signs and symptoms of aortic dissection and when to present to the emergency department.  We will continue surveillance and a repeat CTA was ordered for 6 months. As discussed with patient, if CTA shows ATAA to be 4.4 cm or less, will change surveillance to yearly.  Ardelle Balls, PA-C [facility] [phone]

## 2023-08-20 ENCOUNTER — Ambulatory Visit: Payer: Medicare HMO | Admitting: Physician Assistant

## 2023-08-20 ENCOUNTER — Encounter: Payer: Self-pay | Admitting: Physician Assistant

## 2023-08-20 ENCOUNTER — Ambulatory Visit
Admission: RE | Admit: 2023-08-20 | Discharge: 2023-08-20 | Disposition: A | Discharge status: Home / Self Care | Payer: Medicare HMO | Source: Ambulatory Visit | Attending: Surgery | Admitting: Surgery

## 2023-08-20 VITALS — BP 143/87 | HR 72 | Resp 20 | Ht 73.0 in | Wt 218.0 lb

## 2023-08-20 DIAGNOSIS — I2510 Atherosclerotic heart disease of native coronary artery without angina pectoris: Secondary | ICD-10-CM | POA: Diagnosis not present

## 2023-08-20 DIAGNOSIS — J439 Emphysema, unspecified: Secondary | ICD-10-CM | POA: Diagnosis not present

## 2023-08-20 DIAGNOSIS — I7121 Aneurysm of the ascending aorta, without rupture: Secondary | ICD-10-CM | POA: Diagnosis not present

## 2023-08-20 DIAGNOSIS — I700 Atherosclerosis of aorta: Secondary | ICD-10-CM | POA: Diagnosis not present

## 2023-08-20 MED ORDER — IOPAMIDOL (ISOVUE-370) INJECTION 76%
500.0000 mL | Freq: Once | INTRAVENOUS | Status: AC | PRN
  Administered 2023-08-20: 70 mL via INTRAVENOUS

## 2023-08-20 NOTE — Patient Instructions (Addendum)
Risk Modification in those with ascending thoracic aortic aneurysm:  Continue good control of blood pressure (prefer SBP 130/80 or less)-continue Carvedilol (Coreg)   2. Avoid fluoroquinolone antibiotics (I.e Ciprofloxacin, Avelox, Levofloxacin, Ofloxacin)  3.  Use of statin (to decrease cardiovascular risk)-on Zetia. Lipid Panel done August 2024: Total cholesterol 164, HDL 50, Triglycerides 103, and LDL 94.  4.  Exercise and activity limitations is individualized, but in general, contact sports are to be avoided and one should avoid heavy lifting (defined as half of ideal body weight) and exercises involving sustained Valsalva maneuver.  5. Counseling for those suspected of having genetically mediated disease. First-degree relatives of those with TAA disease should be screened as well as those who have a connective tissue disease (I.e with Marfan syndrome, Ehlers-Danlos syndrome,  and Loeys-Dietz syndrome) or a  bicuspid aortic valve,have an increased risk for complications related to TAA. Patient does not have a family history of connective tissue disease. Echo done January 2024 showed LVEF 45-50% and aortic valve is tri cuspid. Echo done in March 2023 showed no aortic insufficiency or stenosis.  6. If one has tobacco abuse, smoking cessation is highly encouraged.

## 2023-09-10 DIAGNOSIS — M65331 Trigger finger, right middle finger: Secondary | ICD-10-CM | POA: Diagnosis not present

## 2023-09-10 DIAGNOSIS — M65321 Trigger finger, right index finger: Secondary | ICD-10-CM | POA: Diagnosis not present

## 2023-09-15 DIAGNOSIS — L821 Other seborrheic keratosis: Secondary | ICD-10-CM | POA: Diagnosis not present

## 2023-09-15 DIAGNOSIS — Z85828 Personal history of other malignant neoplasm of skin: Secondary | ICD-10-CM | POA: Diagnosis not present

## 2023-09-15 DIAGNOSIS — L570 Actinic keratosis: Secondary | ICD-10-CM | POA: Diagnosis not present

## 2023-09-15 DIAGNOSIS — Z85820 Personal history of malignant melanoma of skin: Secondary | ICD-10-CM | POA: Diagnosis not present

## 2023-10-07 DIAGNOSIS — Z1330 Encounter for screening examination for mental health and behavioral disorders, unspecified: Secondary | ICD-10-CM | POA: Diagnosis not present

## 2023-10-07 DIAGNOSIS — B370 Candidal stomatitis: Secondary | ICD-10-CM | POA: Diagnosis not present

## 2023-10-07 DIAGNOSIS — R1312 Dysphagia, oropharyngeal phase: Secondary | ICD-10-CM | POA: Diagnosis not present

## 2023-10-18 ENCOUNTER — Ambulatory Visit (INDEPENDENT_AMBULATORY_CARE_PROVIDER_SITE_OTHER)

## 2023-10-18 ENCOUNTER — Ambulatory Visit
Admission: RE | Admit: 2023-10-18 | Discharge: 2023-10-18 | Disposition: A | Discharge status: Home / Self Care | Source: Ambulatory Visit | Attending: Nurse Practitioner | Admitting: Nurse Practitioner

## 2023-10-18 VITALS — BP 128/89 | HR 58 | Temp 98.3°F | Resp 20

## 2023-10-18 DIAGNOSIS — R059 Cough, unspecified: Secondary | ICD-10-CM | POA: Diagnosis not present

## 2023-10-18 DIAGNOSIS — J441 Chronic obstructive pulmonary disease with (acute) exacerbation: Secondary | ICD-10-CM | POA: Diagnosis not present

## 2023-10-18 DIAGNOSIS — R0602 Shortness of breath: Secondary | ICD-10-CM

## 2023-10-18 DIAGNOSIS — R918 Other nonspecific abnormal finding of lung field: Secondary | ICD-10-CM | POA: Diagnosis not present

## 2023-10-18 DIAGNOSIS — I771 Stricture of artery: Secondary | ICD-10-CM | POA: Diagnosis not present

## 2023-10-18 MED ORDER — ALBUTEROL SULFATE HFA 108 (90 BASE) MCG/ACT IN AERS: 2.0000 | INHALATION_SPRAY | Freq: Four times a day (QID) | RESPIRATORY_TRACT | 0 refills | Status: DC | PRN

## 2023-10-18 MED ORDER — PROMETHAZINE-DM 6.25-15 MG/5ML PO SYRP: 5.0000 mL | ORAL_SOLUTION | Freq: Four times a day (QID) | ORAL | 0 refills | Status: DC | PRN

## 2023-10-18 MED ORDER — AMOXICILLIN-POT CLAVULANATE 875-125 MG PO TABS
1.0000 | ORAL_TABLET | Freq: Two times a day (BID) | ORAL | 0 refills | Status: AC
Start: 2023-10-18 — End: 2023-10-23

## 2023-10-18 MED ORDER — IPRATROPIUM-ALBUTEROL 0.5-2.5 (3) MG/3ML IN SOLN
3.0000 mL | Freq: Once | RESPIRATORY_TRACT | Status: AC
  Administered 2023-10-18: 3 mL via RESPIRATORY_TRACT

## 2023-10-18 MED ORDER — PREDNISONE 50 MG PO TABS: ORAL_TABLET | ORAL | 0 refills | Status: DC

## 2023-10-18 MED ORDER — METHYLPREDNISOLONE SODIUM SUCC 125 MG IJ SOLR
125.0000 mg | Freq: Once | INTRAMUSCULAR | Status: AC
  Administered 2023-10-18: 125 mg via INTRAMUSCULAR

## 2023-10-18 NOTE — Discharge Instructions (Addendum)
 The chest shows findings consistent with COPD.  No pneumonia. You were given an injection of Solu-Medrol 125 mg and 2 DuoNebulizer treatments today.  Start the prednisone on 10/19/2023. Take medication as prescribed. Increase fluids and allow for plenty of rest. May take over-the-counter Tylenol as needed for pain, fever, or general discomfort. Recommend using a humidifier in your bedroom at nighttime during sleep and sleeping elevated on pillows while symptoms persist. Recommend that you speak with your doctor to discuss smoking cessation. Go to the emergency department immediately if you experience worsening shortness of breath, difficulty breathing, or become unable to speak in a complete sentence. Follow-up with your doctor within the next 5 to 7 days for reevaluation. Follow-up as needed.

## 2023-10-18 NOTE — ED Triage Notes (Signed)
 Pt reports cough, nasal congestion and chest congestion, SOB x 3 weeks, pt states he has tried OTC meds, hx of COPD and has used breathing treatment for that to helpwith sx's has found no relief.

## 2023-10-18 NOTE — ED Provider Notes (Signed)
 [facility]    CSN: [id number] Arrival date & time: 10/18/23  1138      History   Chief Complaint Chief Complaint  Patient presents with   Cough    Heavy chest congestion causing shortness of breath. - Entered by patient    HPI Craig Mccann is a 73 y.o. male.   The history is provided by the patient.   Patient presents with a 3-week history of shortness of breath, cough, chest tightness, chest congestion, and wheezing.  Patient reports prior history of COPD.  Denies fever, chills, headache, ear pain, nasal congestion, runny nose, chest pain, abdominal pain, nausea, vomiting, diarrhea, or rash.  Patient states he has been using over-the-counter medications, states that his daughter works as a Buyer, retail and has also been providing him breathing treatments.  States symptoms have not improved.  Patient reports that he is a current smoker, but has not smoked since his symptoms started.  Past Medical History:  Diagnosis Date   Acute prostatitis 01/2015   Allergy    Arthritis    Basal cell carcinoma 2017   Multiple: back, left shin, left arm   CAD (coronary artery disease)    a. 07/2017: cath showing 50-60% stenosis along the LCx with a FFR of 0.94 not being hemodynamically significant   CAP (community acquired pneumonia) 06/26/2015   Chronic renal insufficiency, stage II (mild)    stage II/II: CrCl 60s.   Complication of anesthesia    COPD (chronic obstructive pulmonary disease) (HCC) 05/07/2015   Long time smoker + COPD changes noted on lung ca screening CT   Diverticulitis    Encounter for screening for lung cancer    Screening CT ok 08/27/16--repeat in 1 yr recommended.   Family history of colon cancer in mother    Hearing impairment    hearing aids   History of adenomatous polyp of colon 12/24/2010   History of kidney stones    Hyperlipidemia 07/14/2016   started atorv 20 07/16/16   Kidney stones    bilat nonobstrucing renal calculi 2016, +  left benign renal cysts   Melanoma (HCC)    R posterolateral neck and R nasal ala.  Most recent was left mid back 01/2016 (Dr. Orvan Falconer).   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   Cardiomyopathy (HCC)    Abnormal nuclear cardiac imaging test    History of colonic polyps    Angina pectoris (HCC) 07/13/2017   Abnormal nuclear stress test 07/13/2017   Coronary artery disease involving native coronary artery of native heart with angina pectoris (HCC) 05/22/2017   Aortic atherosclerosis (HCC) 05/22/2017   Tobacco abuse 05/22/2017   Colon adenoma 05/22/2017   Family history of colon cancer in mother 05/22/2017   Hyperlipidemia 07/14/2016   S/P total knee arthroplasty 04/23/2016   COPD (chronic obstructive pulmonary disease) (HCC) 11/08/2015    Past Surgical History:  Procedure Laterality Date   CERVICAL FUSION     CHOLECYSTECTOMY  1987   COLONOSCOPY  12/24/10; 2014   Multiple TCSs: pt on 5 yr recall, next due 2019.   COLONOSCOPY N/A 10/09/2017   Procedure: COLONOSCOPY;  Surgeon:  Fields, Darleene Cleaver, MD;  Location: [facility];  Service: Endoscopy;  Laterality: N/A;  1:30   CORONARY PRESSURE/FFR STUDY N/A 07/14/2017   Procedure: INTRAVASCULAR PRESSURE WIRE/FFR STUDY;  Surgeon: Lyn Records, MD;  Location: [facility];  Service: Cardiovascular;  Laterality: N/A;   INGUINAL HERNIA REPAIR  1984   JOINT REPLACEMENT     left   KNEE SURGERY Left 2015   cartilage repair   KNEE SURGERY Right 1997   "          "   LEFT HEART CATH AND CORONARY ANGIOGRAPHY N/A 07/14/2017   Procedure: LEFT HEART CATH AND CORONARY ANGIOGRAPHY;  Surgeon: Lyn Records, MD;  Location: [facility]  INVASIVE CV LAB;  Service: Cardiovascular;  Laterality: N/A;   LEFT HEART CATH AND CORONARY ANGIOGRAPHY N/A 10/25/2020   Procedure: LEFT HEART CATH AND CORONARY ANGIOGRAPHY;  Surgeon: Lennette Bihari, MD;  Location: [facility];  Service: Cardiovascular;  Laterality: N/A;   LEFT HEART CATH AND CORONARY ANGIOGRAPHY N/A 12/12/2021   Procedure: LEFT HEART CATH AND CORONARY ANGIOGRAPHY;  Surgeon: [place], Peter M, MD;  Location: [facility] INVASIVE CV LAB;  Service: Cardiovascular;  Laterality: N/A;   LITHOTRIPSY     NECK SURGERY  1989   Thumb surgery Bilateral 2005   and 2006   TOTAL HIP ARTHROPLASTY Left 11/20/2020   Procedure: TOTAL HIP ARTHROPLASTY ANTERIOR APPROACH;  Surgeon: Sheral Apley, MD;  Location: [facility];  Service: Orthopedics;  Laterality: Left;   TOTAL KNEE ARTHROPLASTY Left 04/23/2016   Procedure: TOTAL KNEE ARTHROPLASTY;  Surgeon: Loreta Ave, MD;  Location: [facility] OR;  Service: Orthopedics;  Laterality: Left;   TOTAL KNEE ARTHROPLASTY Right 05/27/2022   Procedure: TOTAL KNEE ARTHROPLASTY;  Surgeon: Sheral Apley, MD;  Location: [facility];  Service: Orthopedics;  Laterality: Right;       Home Medications    Prior to Admission medications   Medication Sig Start Date End Date Taking? Authorizing Provider  albuterol (VENTOLIN HFA) 108 (90 Base) MCG/ACT inhaler Inhale 2 puffs into the lungs every 6 (six) hours as needed. 10/18/23  Yes Leath-Warren, Sadie Haber, NP  amoxicillin-clavulanate (AUGMENTIN) 875-125 MG tablet Take 1 tablet by mouth every 12 (twelve) hours for 5 days. 10/18/23 10/23/23 Yes Leath-Warren, Sadie Haber, NP  predniSONE (DELTASONE) 50 MG tablet Take 1 tablet daily with breakfast for the next 5 days. 10/18/23  Yes Leath-Warren, Sadie Haber, NP  promethazine-dextromethorphan (PROMETHAZINE-DM) 6.25-15 MG/5ML syrup Take 5 mLs by mouth 4 (four) times daily as needed. 10/18/23  Yes Leath-Warren, Sadie Haber, NP  acetaminophen (TYLENOL) 500 MG tablet Take 2 tablets (1,000 mg  total) by mouth every 6 (six) hours as needed for mild pain or moderate pain. 05/27/22   Jenne Pane, PA-C  aspirin EC 81 MG tablet Take 1 tablet (81 mg total) by mouth 2 (two) times daily. For DVT prophylaxis for 30 days after surgery. 05/27/22   Jenne Pane, PA-C  Budeson-Glycopyrrol-Formoterol 160-9-4.8 MCG/ACT AERO Inhale 2 puffs into the lungs 2 (two) times daily. 04/03/21   [provider]  carvedilol (COREG) 3.125 MG tablet Take 1 tablet (3.125 mg total) by mouth 2 (two) times daily. 01/20/23   Sharlene Dory, NP  ezetimibe (ZETIA) 10 MG tablet Take 1 tablet (10 mg total) by mouth daily. 01/20/23   Sharlene Dory, NP  nitroGLYCERIN (NITROSTAT) 0.4 MG SL tablet DISSOLVE 1 TABLET UNDER THE TONGUE EVERY 5 MINUTES AS NEEDED FOR CHEST PAIN. DO NOT EXCEED  A TOTAL OF 3 DOSES IN 15 MINUTES. 08/03/23   Antoine Poche, MD  traZODone (DESYREL) 50 MG tablet Take 50 mg by mouth at bedtime as needed for sleep. 09/28/21   [provider]    Family History Family History  Problem Relation Age of Onset   Colon cancer Mother    Cancer Mother    Lung cancer Father    Cancer Father    Brain cancer Brother    Cancer Brother     Social History Social History   Tobacco Use   Smoking status: Every Day    Current packs/day: 1.00    Average packs/day: 1 pack/day for 55.2 years (55.2 ttl pk-yrs)    Types: Cigarettes    Start date: 08/04/1968   Smokeless tobacco: Never   Tobacco comments:    1 pack every 2-3 days - 01/20/2023  Vaping Use   Vaping status: Never Used  Substance Use Topics   Alcohol use: No    Alcohol/week: 0.0 standard drinks of alcohol   Drug use: No     Allergies   Codeine, Lexiscan [regadenoson], Valium [diazepam], Farxiga [dapagliflozin], and Quinolones   Review of Systems Review of Systems Per HPI  Physical Exam Triage Vital Signs ED Triage Vitals [10/18/23 1210]  Encounter Vitals Group     BP 128/89     Systolic BP Percentile       Diastolic BP Percentile      Pulse Rate 66     Resp 20     Temp 98.3 F (36.8 C)     Temp src      SpO2 94 %     Weight      Height      Head Circumference      Peak Flow      Pain Score      Pain Loc      Pain Education      Exclude from Growth Chart    No data found.  Updated Vital Signs BP 128/89 (BP Location: Right Arm)   Pulse 66   Temp 98.3 F (36.8 C)   Resp 20   SpO2 94%   Visual Acuity Right Eye Distance:   Left Eye Distance:   Bilateral Distance:    Right Eye Near:   Left Eye Near:    Bilateral Near:     Physical Exam Vitals and nursing note reviewed.  Constitutional:      General: He is not in acute distress.    Appearance: Normal appearance.  HENT:     Head: Normocephalic.     Right Ear: Tympanic membrane, ear canal and external ear normal.     Left Ear: Tympanic membrane, ear canal and external ear normal.     Nose: Nose normal.     Mouth/Throat:     Mouth: Mucous membranes are moist.  Eyes:     Extraocular Movements: Extraocular movements intact.     Conjunctiva/sclera: Conjunctivae normal.     Pupils: Pupils are equal, round, and reactive to light.  Cardiovascular:     Rate and Rhythm: Normal rate and regular rhythm.     Pulses: Normal pulses.     Heart sounds: Normal heart sounds.  Pulmonary:     Effort: Pulmonary effort is normal. No respiratory distress.     Breath sounds: No stridor. Examination of the right-upper field reveals wheezing and rhonchi. Examination of the left-upper field reveals wheezing and rhonchi. Examination of the right-lower field reveals wheezing  and rhonchi. Examination of the left-lower field reveals wheezing and rhonchi. Wheezing and rhonchi present. No rales.  Abdominal:     General: Bowel sounds are normal.     Palpations: Abdomen is soft.     Tenderness: There is no abdominal tenderness.  Musculoskeletal:     Cervical back: Normal range of motion.  Lymphadenopathy:     Cervical: No cervical adenopathy.   Skin:    General: Skin is warm and dry.  Neurological:     General: No focal deficit present.     Mental Status: He is alert and oriented to person, place, and time.  Psychiatric:        Mood and Affect: Mood normal.        Behavior: Behavior normal.      UC Treatments / Results  Labs (all labs ordered are listed, but only abnormal results are displayed) Labs Reviewed - No data to display  EKG   Radiology DG Chest 2 View Result Date: 10/18/2023 CLINICAL DATA:  Cough and shortness of breath for 3 weeks. Reported COPD. EXAM: CHEST - 2 VIEW COMPARISON:  04/15/2021.  CT, 08/20/2023. FINDINGS: Cardiac silhouette is normal in size. Aorta is tortuous. No mediastinal or hilar masses or evidence of adenopathy. Lungs are hyperexpanded, but clear. No pleural effusion or pneumothorax. Skeletal structures are intact. IMPRESSION: 1. No active cardiopulmonary disease. 2. Hyperexpanded lungs consistent with COPD. Electronically Signed   By: Amie Portland M.D.   On: 10/18/2023 12:41    Procedures Procedures (including critical care time)  Medications Ordered in UC Medications  methylPREDNISolone sodium succinate (SOLU-MEDROL) 125 mg/2 mL injection 125 mg (125 mg Intramuscular Given 10/18/23 1237)  ipratropium-albuterol (DUONEB) 0.5-2.5 (3) MG/3ML nebulizer solution 3 mL (3 mLs Nebulization Given 10/18/23 1237)    Initial Impression / Assessment and Plan / UC Course  I have reviewed the triage vital signs and the nursing notes.  Pertinent labs & imaging results that were available during my care of the patient were reviewed by me and considered in my medical decision making (see chart for details).  Chest x-ray with findings consistent for COPD.  Solu-Medrol 125 mg IM administered along with 2 DuoNebs.  Patient reports improvement in his breathing.  Will start patient on prednisone 50 mg for the next 5 days for bronchial inflammation, Augmentin 875/125 mg tablets to cover for exacerbation and  lower respiratory infection, and Promethazine DM for his cough.  Patient advised to continue use of his albuterol inhaler as needed.  Supportive care recommendations were provided and discussed with the patient to include fluids, rest, over-the-counter analgesics, to consider smoking cessation and sleeping elevated.  Discussed strict ER follow-up precautions with the patient.  Patient advised to follow-up with his PCP within the next 5 to 7 days for reevaluation.  Patient was in agreement with this plan of care and verbalized understanding.  All questions were answered.  Patient stable for discharge.   Final Clinical Impressions(s) / UC Diagnoses   Final diagnoses:  Cough, unspecified type  Shortness of breath  COPD exacerbation [facility])     Discharge Instructions      The chest shows findings consistent with COPD.  No pneumonia. You were given an injection of Solu-Medrol 125 mg and a duo nebulizer treatment today.  Start the prednisone on 10/19/2023. Take medication as prescribed. Increase fluids and allow for plenty of rest. May take over-the-counter Tylenol as needed for pain, fever, or general discomfort. Recommend using a humidifier in your bedroom at  nighttime during sleep and sleeping elevated on pillows while symptoms persist.      ED Prescriptions     Medication Sig Dispense Auth. Provider   predniSONE (DELTASONE) 50 MG tablet Take 1 tablet daily with breakfast for the next 5 days. 5 tablet Leath-Warren, Sadie Haber, NP   albuterol (VENTOLIN HFA) 108 (90 Base) MCG/ACT inhaler Inhale 2 puffs into the lungs every 6 (six) hours as needed. 8 g Leath-Warren, Sadie Haber, NP   amoxicillin-clavulanate (AUGMENTIN) 875-125 MG tablet Take 1 tablet by mouth every 12 (twelve) hours for 5 days. 10 tablet Leath-Warren, Sadie Haber, NP   promethazine-dextromethorphan (PROMETHAZINE-DM) 6.25-15 MG/5ML syrup Take 5 mLs by mouth 4 (four) times daily as needed. 118 mL Leath-Warren, Sadie Haber, NP       PDMP not reviewed this encounter.   Abran Cantor, NP 10/18/23 [phone]

## 2023-12-16 ENCOUNTER — Encounter: Payer: Self-pay | Admitting: *Deleted

## 2024-01-01 DIAGNOSIS — Z961 Presence of intraocular lens: Secondary | ICD-10-CM | POA: Diagnosis not present

## 2024-01-01 DIAGNOSIS — H26493 Other secondary cataract, bilateral: Secondary | ICD-10-CM | POA: Diagnosis not present

## 2024-01-15 ENCOUNTER — Other Ambulatory Visit: Payer: Self-pay | Admitting: Surgery

## 2024-01-15 DIAGNOSIS — I7121 Aneurysm of the ascending aorta, without rupture: Secondary | ICD-10-CM

## 2024-01-18 ENCOUNTER — Ambulatory Visit: Admitting: Gastroenterology

## 2024-01-22 ENCOUNTER — Encounter: Payer: Self-pay | Admitting: Cardiology

## 2024-01-22 ENCOUNTER — Ambulatory Visit: Payer: Medicare HMO | Attending: Cardiology | Admitting: Cardiology

## 2024-01-22 VITALS — BP 125/70 | HR 76 | Ht 73.5 in | Wt 195.6 lb

## 2024-01-22 DIAGNOSIS — E782 Mixed hyperlipidemia: Secondary | ICD-10-CM

## 2024-01-22 DIAGNOSIS — I2510 Atherosclerotic heart disease of native coronary artery without angina pectoris: Secondary | ICD-10-CM | POA: Diagnosis not present

## 2024-01-22 DIAGNOSIS — I5022 Chronic systolic (congestive) heart failure: Secondary | ICD-10-CM | POA: Diagnosis not present

## 2024-01-22 MED ORDER — CARVEDILOL 3.125 MG PO TABS
3.1250 mg | ORAL_TABLET | Freq: Two times a day (BID) | ORAL | 6 refills | Status: AC
Start: 2024-01-22 — End: ?

## 2024-01-22 MED ORDER — EZETIMIBE 10 MG PO TABS: 10.0000 mg | ORAL_TABLET | Freq: Every day | ORAL | 6 refills | Status: DC

## 2024-01-22 NOTE — Patient Instructions (Signed)
 Medication Instructions:   Coreg , Zetia  refilled today  Continue all other medications.     Labwork:  none  Testing/Procedures:  none  Follow-Up:  4 months   Any Other Special Instructions Will Be Listed Below (If Applicable).   If you need a refill on your cardiac medications before your next appointment, please call your pharmacy.

## 2024-01-22 NOTE — Progress Notes (Signed)
 Clinical Summary Craig Mccann is a 73 y.o.male seen today for follow up of the following medical problems.    1. Chest pain/CAD 06/2017 nuclear stress: moderate mid inferoseptal and mid inferior defect with peri-infarct ischemia. LVEF 46% - 06/2017 echo LVEF 45-50%, grade I diastolic dysfunction     10/2020 cath: LAD patent, D1 20%, ramus 25%, LCX 65% prox, RCA mid 15% and distal 30% 12/2021 cath: LAD patent, D1 20%, ramus 25%, prox LCX 50%, RCA mi 15% and distal 30%   - headaches on imdur , stopped taking - no chest pains, some SOB with hot temps and humidity. Better with prn albuterol .       2. Chronic systolic HF New diagnosis of systolic HF in 10/2020 10/2020 echo LVEF 30-35%, mod to severe asymmetric septal hypertrophy, grade I dd,  10/2020 cath: LAD patent, D1 20%, ramus 25%, LCX 65% prox, RCA mid 15% and distal 30%    - significant elevation in Cr with losartan  to 1.8 on 06/12/21 - medication stopped, at recheck Cr back to baseline 1.48     -last visit started aldactone  12.5mg  daily, farxiga  10mg  daiy. - repeat labs Cr 1.54, K 4.4 -no recent symptoms.  -appears he accidentally restarted his losartan      10/2021 echo: LVEF 30-35% - some edema in right leg after knee replacement, no SOB/DOE - still consiering ICD, wanted to wait until after knee surgery. Enough time has passed would repeat limited echo before reconsidering.     Jan 2024 echo: LVEF 45-50%, grade I dd - no LE edema, SOB better with inhaler.  - noncompliant with meds   3. Hyperlipidemia 03/2021 TC 202 TG 117 HDL 39 LDL 142 - reports joint pains, he stopped taking atorvastatin  about 1 month and symptoms improved. - ongoing muscle aches with crestor .    - refererred to lipid clinic, does not appear was able to see   03/2023 TC 164 TG 103 HDL 50 LDL 94 - not compliant with zetia    4. Aortic aneurysm - followed by CT surgery  Jan 2025 CTA: ascending 4.2 cm, descending 4.5 cm.  Past Medical History:   Diagnosis Date   Acute prostatitis 01/2015   Allergy    Arthritis    Basal cell carcinoma 2017   Multiple: back, left shin, left arm   CAD (coronary artery disease)    a. 07/2017: cath showing 50-60% stenosis along the LCx with a FFR of 0.94 not being hemodynamically significant   CAP (community acquired pneumonia) 06/26/2015   Chronic renal insufficiency, stage II (mild)    stage II/II: CrCl 60s.   Complication of anesthesia    COPD (chronic obstructive pulmonary disease) (HCC) 05/07/2015   Long time smoker + COPD changes noted on lung ca screening CT   Diverticulitis    Encounter for screening for lung cancer    Screening CT ok 08/27/16--repeat in 1 yr recommended.   Family history of colon cancer in mother    Hearing impairment    hearing aids   History of adenomatous polyp of colon 12/24/2010   History of kidney stones    Hyperlipidemia 07/14/2016   started atorv 20 07/16/16   Kidney stones    bilat nonobstrucing renal calculi 2016, + left benign renal cysts   Melanoma (HCC)    R posterolateral neck and R nasal ala.  Most recent was left mid back 01/2016 (Dr. Savannah Curlin).   Spinal headache    Tobacco dependence    CT chest  for lung ca screning 04/2015 showed benign findings; 1 yr repeat recommended     Allergies  Allergen Reactions   Codeine Hives   Lexiscan  [Regadenoson ] Other (See Comments)    Transient Heart Block   Valium [Diazepam] Anaphylaxis   Farxiga  [Dapagliflozin ] Other (See Comments)    Made pt feel down.    Quinolones     Patient with ATAA (ascending thoracic aortic aneurysm)     Current Outpatient Medications  Medication Sig Dispense Refill   acetaminophen  (TYLENOL ) 500 MG tablet Take 2 tablets (1,000 mg total) by mouth every 6 (six) hours as needed for mild pain or moderate pain. 60 tablet 0   albuterol  (VENTOLIN  HFA) 108 (90 Base) MCG/ACT inhaler Inhale 2 puffs into the lungs every 6 (six) hours as needed. 8 g 0   aspirin  EC 81 MG tablet Take 1  tablet (81 mg total) by mouth 2 (two) times daily. For DVT prophylaxis for 30 days after surgery. 60 tablet 0   Budeson-Glycopyrrol-Formoterol 160-9-4.8 MCG/ACT AERO Inhale 2 puffs into the lungs 2 (two) times daily.     carvedilol  (COREG ) 3.125 MG tablet Take 1 tablet (3.125 mg total) by mouth 2 (two) times daily. 60 tablet 6   ezetimibe  (ZETIA ) 10 MG tablet Take 1 tablet (10 mg total) by mouth daily. 30 tablet 6   nitroGLYCERIN  (NITROSTAT ) 0.4 MG SL tablet DISSOLVE 1 TABLET UNDER THE TONGUE EVERY 5 MINUTES AS NEEDED FOR CHEST PAIN. DO NOT EXCEED A TOTAL OF 3 DOSES IN 15 MINUTES. 25 tablet 3   predniSONE  (DELTASONE ) 50 MG tablet Take 1 tablet daily with breakfast for the next 5 days. 5 tablet 0   promethazine -dextromethorphan (PROMETHAZINE -DM) 6.25-15 MG/5ML syrup Take 5 mLs by mouth 4 (four) times daily as needed. 118 mL 0   traZODone (DESYREL) 50 MG tablet Take 50 mg by mouth at bedtime as needed for sleep.     No current facility-administered medications for this visit.     Past Surgical History:  Procedure Laterality Date   CERVICAL FUSION     CHOLECYSTECTOMY  1987   COLONOSCOPY  12/24/10; 2014   Multiple TCSs: pt on 5 yr recall, next due 2019.   COLONOSCOPY N/A 10/09/2017   Procedure: COLONOSCOPY;  Surgeon: Alyce Jubilee, MD;  [facility];  Service: Endoscopy;  Laterality: N/A;  1:30   CORONARY PRESSURE/FFR STUDY N/A 07/14/2017   Procedure: INTRAVASCULAR PRESSURE WIRE/FFR STUDY;  Surgeon: Arty Binning, MD;  [facility]: [facility];  Service: Cardiovascular;  Laterality: N/A;   INGUINAL HERNIA REPAIR  1984   JOINT REPLACEMENT     left   KNEE SURGERY Left 2015   cartilage repair   KNEE SURGERY Right 1997                LEFT HEART CATH AND CORONARY ANGIOGRAPHY N/A 07/14/2017   Procedure: LEFT HEART CATH AND CORONARY ANGIOGRAPHY;  Surgeon: Arty Binning, MD;  [facility]: [facility];  Service: Cardiovascular;  Laterality: N/A;   LEFT HEART CATH AND CORONARY  ANGIOGRAPHY N/A 10/25/2020   Procedure: LEFT HEART CATH AND CORONARY ANGIOGRAPHY;  Surgeon: Millicent Ally, MD;  [facility]: [facility];  Service: Cardiovascular;  Laterality: N/A;   LEFT HEART CATH AND CORONARY ANGIOGRAPHY N/A 12/12/2021   Procedure: LEFT HEART CATH AND CORONARY ANGIOGRAPHY;  Surgeon: Swaziland, Peter M, MD;  Location: [facility] INVASIVE CV LAB;  Service: Cardiovascular;  Laterality: N/A;   LITHOTRIPSY     NECK  SURGERY  1989   Thumb surgery Bilateral 2005   and 2006   TOTAL HIP ARTHROPLASTY Left 11/20/2020   Procedure: TOTAL HIP ARTHROPLASTY ANTERIOR APPROACH;  Surgeon: Saundra Curl, MD;  Location: [facility];  Service: Orthopedics;  Laterality: Left;   TOTAL KNEE ARTHROPLASTY Left 04/23/2016   Procedure: TOTAL KNEE ARTHROPLASTY;  Surgeon: Ferd Householder, MD;  [facility];  Service: Orthopedics;  Laterality: Left;   TOTAL KNEE ARTHROPLASTY Right 05/27/2022   Procedure: TOTAL KNEE ARTHROPLASTY;  Surgeon: Saundra Curl, MD;  Location: [facility];  Service: Orthopedics;  Laterality: Right;     Allergies  Allergen Reactions   Codeine Hives   Lexiscan  [Regadenoson ] Other (See Comments)    Transient Heart Block   Valium [Diazepam] Anaphylaxis   Farxiga  [Dapagliflozin ] Other (See Comments)    Made pt feel down.    Quinolones     Patient with ATAA (ascending thoracic aortic aneurysm)      Family History  Problem Relation Age of Onset   Colon cancer Mother    Cancer Mother    Lung cancer Father    Cancer Father    Brain cancer Brother    Cancer Brother      Social History Craig Mccann reports that he has been smoking cigarettes. He started smoking about 55 years ago. He has a 55.5 pack-year smoking history. He has never used smokeless tobacco. Craig Mccann reports no history of alcohol use.    Physical Examination Today's Vitals   01/22/24 1027  BP: (!) 150/98  Pulse: 76  SpO2: 98%  Weight: 195 lb 9.6 oz (88.7 kg)  Height: 6' 1.5 (1.867 m)   Body mass  index is 25.46 kg/m.  Gen: resting comfortably, no acute distress HEENT: no scleral icterus, pupils equal round and reactive, no palptable cervical adenopathy,  CV: RRR, no m/rg, no jvd Resp: Clear to auscultation bilaterally GI: abdomen is soft, non-tender, non-distended, normal bowel sounds, no hepatosplenomegaly MSK: extremities are warm, no edema.  Skin: warm, no rash Neuro:  no focal deficits Psych: appropriate affect   Diagnostic Studies 06/2017 nuclear stress There was no ST segment deviation noted during stress. Defect 1: There is a medium defect of moderate severity present in the mid inferoseptal and mid inferior location. Findings consistent with prior myocardial infarction with peri-infarct ischemia. Variable soft tissue attenuation artifact is also contributing to defect. This is a low to intermediate risk study. Nuclear stress EF: 46%.   11.2018 echoStudy Conclusions   - Left ventricle: The cavity size was normal. Wall thickness was   increased increased in a pattern of mild to moderate LVH.   Systolic function was mildly reduced. The estimated ejection   fraction was in the range of 45% to 50%. Doppler parameters are   consistent with abnormal left ventricular relaxation (grade 1   diastolic dysfunction). - Aortic valve: Valve area (VTI): 1.97 cm^2. Valve area (Vmax):   2.04 cm^2. Valve area (Vmean): 2 cm^2. - Technically adequate study.   10/2020 cath Ost 1st Diag lesion is 20% stenosed. Dist RCA lesion is 30% stenosed. Ramus lesion is 25% stenosed. Prox Cx to Mid Cx lesion is 65% stenosed. Mid RCA lesion is 15% stenosed.   Mild to moderate nonobstructive CAD with 20% irregularity in the first diagonal Chyane Greer of the LAD, 20% stenosis in the ramus intermediate vessel, smooth 20% proximal circumflex stenosis with no change in the previously noted 65% mid AV groove circumflex stenosis, and mild 15 and 30% smooth  narrowings in a dominant RCA.   No significant  change since the prior catheterization of 2019.   LVEDP 15 mmHg.   RECOMMENDATION: Guideline directed medical therapy for nonischemic cardiomyopathy with reduced EF at 30 to 35%.  The patient will return to Dr. Amanda Jungling.  Smoking cessation is essential.  Aggressive lipid-lowering therapy with target LDL less than 70     10/2020 echo IMPRESSIONS     1. Left ventricular ejection fraction, by estimation, is 30 to 35%. The  left ventricle has moderately decreased function. The left ventricle  demonstrates global hypokinesis. The left ventricular internal cavity size  was mildly dilated. There is moderate   to severe asymmetric left ventricular hypertrophy of the septal segment.  Left ventricular diastolic parameters are consistent with Grade I  diastolic dysfunction (impaired relaxation).   2. Right ventricular systolic function is normal. The right ventricular  size is normal. There is normal pulmonary artery systolic pressure. The  estimated right ventricular systolic pressure is 16.2 mmHg.   3. There is a trivial pericardial effusion anterior to the right  ventricle.   4. The mitral valve is abnormal. Trivial mitral valve regurgitation.   5. The aortic valve is tricuspid. There is mild calcification of the  aortic valve. Aortic valve regurgitation is not visualized. Mild aortic  valve sclerosis is present, with no evidence of aortic valve stenosis.   6. The inferior vena cava is normal in size with greater than 50%  respiratory variability, suggesting right atrial pressure of 3 mmHg.      12/2021 cath Dist RCA lesion is 30% stenosed.   Mid RCA lesion is 15% stenosed.   Ramus lesion is 25% stenosed.   Ost 1st Diag lesion is 20% stenosed.   Prox Cx to Mid Cx lesion is 50% stenosed.   LV end diastolic pressure is normal.   Nonobstructive CAD. If anything the lesion in the mid LCx is improved from prior. FFR of this lesion in 2018 was normal.  Normal LVEDP   Plan: I don't see  anything from a coronary standpoint that would explain his chest pain symptoms particularly at rest. Would consider noncardiac causes of pain. Continue medical management.    Jan 2024 echo  1. Limited Echo to evaluate for LVEF   2. Left ventricular ejection fraction, by estimation, is 45 to 50%. The  left ventricle has low normal function. The left ventricle has no regional  wall motion abnormalities. The left ventricular internal cavity size was  mildly to moderately dilated.  Left ventricular diastolic parameters are consistent with Grade I  diastolic dysfunction (impaired relaxation). The average left ventricular  global longitudinal strain is -19.9 %. The global longitudinal strain is  normal.   3. Right ventricular systolic function is normal. The right ventricular  size is normal   Assessment and Plan   1. HFmrEF - new diagnosis 10/2020 - significant elevation of Cr with just low dose losartan , discontinued. Would avoid ACE/ARB/ARNI.  - encouraged him to restart his coreg , discussed risks of HF relapse      2. CAD with chronic stable angina - no recent symptoms, continue current meds - EKG today shows SR, no ischemic changes   3. Hyperlipidemia - has not tolerated atorva or rosuvastatin  - mixed compliance with zetia , ecouraged to take daily - upcoming labs with pcp - follow lipids on zetia  more regularly, consider trying again to get him into lipid clinic.        Laurann Pollock, M.D.

## 2024-01-25 NOTE — Progress Notes (Unsigned)
 Referring Provider: Suanne Pfeiffer, NP Primary Care Physician:  Suanne Pfeiffer, NP Primary Gastroenterologist:  Dr. Cindie.  (Dr. Harvey previously)  No chief complaint on file.   HPI:   Craig Mccann is a 73 y.o. male presenting today at the request of Suanne Pfeiffer, NP for oropharyngeal dysphagia.  Reviewed OV with PCP dated 10/07/2023.  Patient reported sore throat, feeling that it was something stuck in his throat, difficulty swallowing, getting choked at times.  Had a lot of mucus in his throat.  Had stopped Pepcid .  Recommended resuming Pepcid  40 mg twice daily and was prescribed nystatin for oral thrush.  Noted longstanding history of nicotine use.  Recommended seeing GI for possible EGD.    Today:     Prior EGD:  Last colonoscopy 10/09/2017: - Six 3 to 6 mm polyps in the rectum, at the hepatic flexure and in the ascending colon, removed with a cold snare. Resected and retrieved.  - One 7 mm polyp at the hepatic flexure, removed with a hot snare. Resected and retrieved.  - Diverticulosis in the recto- sigmoid colon and in the sigmoid colon.  - External and internal hemorrhoids. - Tubular adenomas x 5, sessile serrated polyp x 3, and hyperplastic polyp. - Recommended 3-year surveillance.  Past Medical History:  Diagnosis Date   Acute prostatitis 01/2015   Allergy    Arthritis    Basal cell carcinoma 2017   Multiple: back, left shin, left arm   CAD (coronary artery disease)    a. 07/2017: cath showing 50-60% stenosis along the LCx with a FFR of 0.94 not being hemodynamically significant   CAP (community acquired pneumonia) 06/26/2015   Chronic renal insufficiency, stage II (mild)    stage II/II: CrCl 60s.   Complication of anesthesia    COPD (chronic obstructive pulmonary disease) (HCC) 05/07/2015   Long time smoker + COPD changes noted on lung ca screening CT   Diverticulitis    Encounter for screening for lung cancer    Screening CT ok 08/27/16--repeat  in 1 yr recommended.   Family history of colon cancer in mother    Hearing impairment    hearing aids   History of adenomatous polyp of colon 12/24/2010   History of kidney stones    Hyperlipidemia 07/14/2016   started atorv 20 07/16/16   Kidney stones    bilat nonobstrucing renal calculi 2016, + left benign renal cysts   Melanoma (HCC)    R posterolateral neck and R nasal ala.  Most recent was left mid back 01/2016 (Dr. Eda).   Spinal headache    Tobacco dependence    CT chest for lung ca screning 04/2015 showed benign findings; 1 yr repeat recommended    Past Surgical History:  Procedure Laterality Date   CERVICAL FUSION     CHOLECYSTECTOMY  1987   COLONOSCOPY  12/24/10; 2014   Multiple TCSs: pt on 5 yr recall, next due 2019.   COLONOSCOPY N/A 10/09/2017   Procedure: COLONOSCOPY;  Surgeon: Harvey Margo CROME, MD;  [facility];  Service: Endoscopy;  Laterality: N/A;  1:30   CORONARY PRESSURE/FFR STUDY N/A 07/14/2017   Procedure: INTRAVASCULAR PRESSURE WIRE/FFR STUDY;  Surgeon: Claudene Victory ORN, MD;  Location: [facility];  Service: Cardiovascular;  Laterality: N/A;   INGUINAL HERNIA REPAIR  1984   JOINT REPLACEMENT     left   KNEE SURGERY Left 2015   cartilage repair   KNEE SURGERY Right 1997  LEFT HEART CATH AND CORONARY ANGIOGRAPHY N/A 07/14/2017   Procedure: LEFT HEART CATH AND CORONARY ANGIOGRAPHY;  Surgeon: Claudene Victory ORN, MD;  [facility]: [facility];  Service: Cardiovascular;  Laterality: N/A;   LEFT HEART CATH AND CORONARY ANGIOGRAPHY N/A 10/25/2020   Procedure: LEFT HEART CATH AND CORONARY ANGIOGRAPHY;  Surgeon: Burnard Debby LABOR, MD;  [facility]: [facility];  Service: Cardiovascular;  Laterality: N/A;   LEFT HEART CATH AND CORONARY ANGIOGRAPHY N/A 12/12/2021   Procedure: LEFT HEART CATH AND CORONARY ANGIOGRAPHY;  Surgeon: Swaziland, Peter M, MD;  Location: [facility] INVASIVE CV LAB;  Service: Cardiovascular;  Laterality: N/A;   LITHOTRIPSY      NECK SURGERY  1989   Thumb surgery Bilateral 2005   and 2006   TOTAL HIP ARTHROPLASTY Left 11/20/2020   Procedure: TOTAL HIP ARTHROPLASTY ANTERIOR APPROACH;  Surgeon: Beverley Evalene BIRCH, MD;  Location: [facility];  Service: Orthopedics;  Laterality: Left;   TOTAL KNEE ARTHROPLASTY Left 04/23/2016   Procedure: TOTAL KNEE ARTHROPLASTY;  Surgeon: Toribio JULIANNA Beverley, MD;  Location: [facility] OR;  Service: Orthopedics;  Laterality: Left;   TOTAL KNEE ARTHROPLASTY Right 05/27/2022   Procedure: TOTAL KNEE ARTHROPLASTY;  Surgeon: Beverley Evalene BIRCH, MD;  Location: [facility];  Service: Orthopedics;  Laterality: Right;    Current Outpatient Medications  Medication Sig Dispense Refill   albuterol  (VENTOLIN  HFA) 108 (90 Base) MCG/ACT inhaler Inhale 2 puffs into the lungs every 6 (six) hours as needed. 8 g 0   aspirin  EC 81 MG tablet Take 1 tablet (81 mg total) by mouth 2 (two) times daily. For DVT prophylaxis for 30 days after surgery. 60 tablet 0   Budeson-Glycopyrrol-Formoterol 160-9-4.8 MCG/ACT AERO Inhale 2 puffs into the lungs 2 (two) times daily.     carvedilol  (COREG ) 3.125 MG tablet Take 1 tablet (3.125 mg total) by mouth 2 (two) times daily. 60 tablet 6   ezetimibe  (ZETIA ) 10 MG tablet Take 1 tablet (10 mg total) by mouth daily. 30 tablet 6   famotidine  (PEPCID ) 40 MG tablet Take 40 mg by mouth daily as needed for heartburn or indigestion.     nitroGLYCERIN  (NITROSTAT ) 0.4 MG SL tablet DISSOLVE 1 TABLET UNDER THE TONGUE EVERY 5 MINUTES AS NEEDED FOR CHEST PAIN. DO NOT EXCEED A TOTAL OF 3 DOSES IN 15 MINUTES. 25 tablet 3   promethazine -dextromethorphan (PROMETHAZINE -DM) 6.25-15 MG/5ML syrup Take 5 mLs by mouth 4 (four) times daily as needed. 118 mL 0   No current facility-administered medications for this visit.    Allergies as of 01/27/2024 - Review Complete 01/22/2024  Allergen Reaction Noted   Codeine Hives 01/31/2015   Lexiscan  [regadenoson ] Other (See Comments) 10/12/2020   Valium [diazepam] Anaphylaxis  01/31/2015   Farxiga  [dapagliflozin ] Other (See Comments) 10/31/2022   Quinolones  08/20/2023    Family History  Problem Relation Age of Onset   Colon cancer Mother    Cancer Mother    Lung cancer Father    Cancer Father    Brain cancer Brother    Cancer Brother     Social History   Socioeconomic History   Marital status: Married    Spouse name: Not on file   Number of children: Not on file   Years of education: Not on file   Highest education level: Not on file  Occupational History   Not on file  Tobacco Use   Smoking status: Every Day    Current packs/day: 1.00    Average packs/day: 1 pack/day  for 55.5 years (55.5 ttl pk-yrs)    Types: Cigarettes    Start date: 08/04/1968   Smokeless tobacco: Never   Tobacco comments:    1-2 a week   Vaping Use   Vaping status: Never Used  Substance and Sexual Activity   Alcohol use: No    Alcohol/week: 0.0 standard drinks of alcohol   Drug use: No   Sexual activity: Yes  Other Topics Concern   Not on file  Social History Narrative   Married, 3 daughters.   Educ: HS + 2 yr technical school.   Occupation: mechanic--RETIRED.  Orig from [place] .   Relocated to [facility] 2016.   Tob: 50 pack-yr hx; cutting back as of 01/2015.   No alc or drugs.   Social Drivers of Corporate investment banker Strain: Low Risk  (10/07/2023)   Received from [facility]   Overall Financial Resource Strain (CARDIA)    Difficulty of Paying Living Expenses: Not hard at all  Food Insecurity: No Food Insecurity (10/07/2023)   Received from [facility]   Hunger Vital Sign    Within the past 12 months, you worried that your food would run out before you got the money to buy more.: Never true    Within the past 12 months, the food you bought just didn't last and you didn't have money to get more.: Never true  Transportation Needs: No Transportation Needs (10/07/2023)   Received from [facility] - Transportation    Lack of Transportation (Medical):  No    Lack of Transportation (Non-Medical): No  Physical Activity: Insufficiently Active (07/07/2022)   Received from [facility]   Exercise Vital Sign    On average, how many days per week do you engage in moderate to strenuous exercise (like a brisk walk)?: 3 days    On average, how many minutes do you engage in exercise at this level?: 30 min  Stress: No Stress Concern Present (07/07/2022)   Received from [facility] - Occupational Stress Questionnaire    Feeling of Stress : Only a little  Social Connections: Moderately Integrated (07/07/2022)   Received from [facility]   Social Network    How would you rate your social network (family, work, friends)?: Adequate participation with social networks  Intimate Partner Violence: Not At Risk (07/07/2022)   Received from [facility]   HITS    Over the last 12 months how often did your partner physically hurt you?: Never    Over the last 12 months how often did your partner insult you or talk down to you?: Never    Over the last 12 months how often did your partner threaten you with physical harm?: Never    Over the last 12 months how often did your partner scream or curse at you?: Never    Review of Systems: Gen: Denies any fever, chills, fatigue, weight loss, lack of appetite.  CV: Denies chest pain, heart palpitations, peripheral edema, syncope.  Resp: Denies shortness of breath at rest or with exertion. Denies wheezing or cough.  GI: Denies dysphagia or odynophagia. Denies jaundice, hematemesis, fecal incontinence. GU : Denies urinary burning, urinary frequency, urinary hesitancy MS: Denies joint pain, muscle weakness, cramps, or limitation of movement.  Derm: Denies rash, itching, dry skin Psych: Denies depression, anxiety, memory loss, and confusion Heme: Denies bruising, bleeding, and enlarged lymph nodes.  Physical Exam: There were no vitals taken for this visit.  General:   Alert  and oriented. Pleasant and cooperative. Well-nourished and well-developed.  Head:  Normocephalic and atraumatic. Eyes:  Without icterus, sclera clear and conjunctiva pink.  Ears:  Normal auditory acuity. Lungs:  Clear to auscultation bilaterally. No wheezes, rales, or rhonchi. No distress.  Heart:  S1, S2 present without murmurs appreciated.  Abdomen:  +BS, soft, non-tender and non-distended. No HSM noted. No guarding or rebound. No masses appreciated.  Rectal:  Deferred  Msk:  Symmetrical without gross deformities. Normal posture. Extremities:  Without edema. Neurologic:  Alert and  oriented x4;  grossly normal neurologically. Skin:  Intact without significant lesions or rashes. Psych:  Alert and cooperative. Normal mood and affect.    Assessment:     Plan:  ***   Josette Centers, PA-C [facility] 01/27/2024

## 2024-01-25 NOTE — H&P (View-Only) (Signed)
 Referring Provider: Suanne Pfeiffer, NP Primary Care Physician:  Suanne Pfeiffer, NP Primary Gastroenterologist:  Dr. Cindie.  (Dr. Harvey previously)  No chief complaint on file.   HPI:   Craig Mccann is a 73 y.o. male presenting today at the request of Suanne Pfeiffer, NP for oropharyngeal dysphagia.  Reviewed OV with PCP dated 10/07/2023.  Patient reported sore throat, feeling that it was something stuck in his throat, difficulty swallowing, getting choked at times.  Had a lot of mucus in his throat.  Had stopped Pepcid .  Recommended resuming Pepcid  40 mg twice daily and was prescribed nystatin for oral thrush.  Noted longstanding history of nicotine use.  Recommended seeing GI for possible EGD.    Today:     Prior EGD:  Last colonoscopy 10/09/2017: - Six 3 to 6 mm polyps in the rectum, at the hepatic flexure and in the ascending colon, removed with a cold snare. Resected and retrieved.  - One 7 mm polyp at the hepatic flexure, removed with a hot snare. Resected and retrieved.  - Diverticulosis in the recto- sigmoid colon and in the sigmoid colon.  - External and internal hemorrhoids. - Tubular adenomas x 5, sessile serrated polyp x 3, and hyperplastic polyp. - Recommended 3-year surveillance.  Past Medical History:  Diagnosis Date   Acute prostatitis 01/2015   Allergy    Arthritis    Basal cell carcinoma 2017   Multiple: back, left shin, left arm   CAD (coronary artery disease)    a. 07/2017: cath showing 50-60% stenosis along the LCx with a FFR of 0.94 not being hemodynamically significant   CAP (community acquired pneumonia) 06/26/2015   Chronic renal insufficiency, stage II (mild)    stage II/II: CrCl 60s.   Complication of anesthesia    COPD (chronic obstructive pulmonary disease) (HCC) 05/07/2015   Long time smoker + COPD changes noted on lung ca screening CT   Diverticulitis    Encounter for screening for lung cancer    Screening CT ok 08/27/16--repeat  in 1 yr recommended.   Family history of colon cancer in mother    Hearing impairment    hearing aids   History of adenomatous polyp of colon 12/24/2010   History of kidney stones    Hyperlipidemia 07/14/2016   started atorv 20 07/16/16   Kidney stones    bilat nonobstrucing renal calculi 2016, + left benign renal cysts   Melanoma (HCC)    R posterolateral neck and R nasal ala.  Most recent was left mid back 01/2016 (Dr. Eda).   Spinal headache    Tobacco dependence    CT chest for lung ca screning 04/2015 showed benign findings; 1 yr repeat recommended    Past Surgical History:  Procedure Laterality Date   CERVICAL FUSION     CHOLECYSTECTOMY  1987   COLONOSCOPY  12/24/10; 2014   Multiple TCSs: pt on 5 yr recall, next due 2019.   COLONOSCOPY N/A 10/09/2017   Procedure: COLONOSCOPY;  Surgeon: Harvey Margo CROME, MD;  Location: [facility];  Service: Endoscopy;  Laterality: N/A;  1:30   CORONARY PRESSURE/FFR STUDY N/A 07/14/2017   Procedure: INTRAVASCULAR PRESSURE WIRE/FFR STUDY;  Surgeon: Claudene Victory ORN, MD;  Location: [facility]VASIVE [facility];  Service: Cardiovascular;  Laterality: N/A;   INGUINAL HERNIA REPAIR  1984   JOINT REPLACEMENT     left   KNEE SURGERY Left 2015   cartilage repair   KNEE SURGERY Right 1997  LEFT HEART CATH AND CORONARY ANGIOGRAPHY N/A 07/14/2017   Procedure: LEFT HEART CATH AND CORONARY ANGIOGRAPHY;  Surgeon: Claudene Victory ORN, MD;  Location: [facility];  Service: Cardiovascular;  Laterality: N/A;   LEFT HEART CATH AND CORONARY ANGIOGRAPHY N/A 10/25/2020   Procedure: LEFT HEART CATH AND CORONARY ANGIOGRAPHY;  Surgeon: Burnard Debby LABOR, MD;  Location: [facility];  Service: Cardiovascular;  Laterality: N/A;   LEFT HEART CATH AND CORONARY ANGIOGRAPHY N/A 12/12/2021   Procedure: LEFT HEART CATH AND CORONARY ANGIOGRAPHY;  Surgeon: [place], Peter M, MD;  Location: [facility] INVASIVE CV LAB;  Service: Cardiovascular;  Laterality: N/A;   LITHOTRIPSY      NECK SURGERY  1989   Thumb surgery Bilateral 2005   and 2006   TOTAL HIP ARTHROPLASTY Left 11/20/2020   Procedure: TOTAL HIP ARTHROPLASTY ANTERIOR APPROACH;  Surgeon: Beverley Evalene BIRCH, MD;  Location: [facility];  Service: Orthopedics;  Laterality: Left;   TOTAL KNEE ARTHROPLASTY Left 04/23/2016   Procedure: TOTAL KNEE ARTHROPLASTY;  Surgeon: Toribio JULIANNA Beverley, MD;  Location: [facility] OR;  Service: Orthopedics;  Laterality: Left;   TOTAL KNEE ARTHROPLASTY Right 05/27/2022   Procedure: TOTAL KNEE ARTHROPLASTY;  Surgeon: Beverley Evalene BIRCH, MD;  Location: [facility];  Service: Orthopedics;  Laterality: Right;    Current Outpatient Medications  Medication Sig Dispense Refill   albuterol  (VENTOLIN  HFA) 108 (90 Base) MCG/ACT inhaler Inhale 2 puffs into the lungs every 6 (six) hours as needed. 8 g 0   aspirin  EC 81 MG tablet Take 1 tablet (81 mg total) by mouth 2 (two) times daily. For DVT prophylaxis for 30 days after surgery. 60 tablet 0   Budeson-Glycopyrrol-Formoterol 160-9-4.8 MCG/ACT AERO Inhale 2 puffs into the lungs 2 (two) times daily.     carvedilol  (COREG ) 3.125 MG tablet Take 1 tablet (3.125 mg total) by mouth 2 (two) times daily. 60 tablet 6   ezetimibe  (ZETIA ) 10 MG tablet Take 1 tablet (10 mg total) by mouth daily. 30 tablet 6   famotidine  (PEPCID ) 40 MG tablet Take 40 mg by mouth daily as needed for heartburn or indigestion.     nitroGLYCERIN  (NITROSTAT ) 0.4 MG SL tablet DISSOLVE 1 TABLET UNDER THE TONGUE EVERY 5 MINUTES AS NEEDED FOR CHEST PAIN. DO NOT EXCEED A TOTAL OF 3 DOSES IN 15 MINUTES. 25 tablet 3   promethazine -dextromethorphan (PROMETHAZINE -DM) 6.25-15 MG/5ML syrup Take 5 mLs by mouth 4 (four) times daily as needed. 118 mL 0   No current facility-administered medications for this visit.    Allergies as of 01/27/2024 - Review Complete 01/22/2024  Allergen Reaction Noted   Codeine Hives 01/31/2015   Lexiscan  [regadenoson ] Other (See Comments) 10/12/2020   Valium [diazepam] Anaphylaxis  01/31/2015   Farxiga  [dapagliflozin ] Other (See Comments) 10/31/2022   Quinolones  08/20/2023    Family History  Problem Relation Age of Onset   Colon cancer Mother    Cancer Mother    Lung cancer Father    Cancer Father    Brain cancer Brother    Cancer Brother     Social History   Socioeconomic History   Marital status: Married    Spouse name: Not on file   Number of children: Not on file   Years of education: Not on file   Highest education level: Not on file  Occupational History   Not on file  Tobacco Use   Smoking status: Every Day    Current packs/day: 1.00    Average packs/day: 1 pack/day  for 55.5 years (55.5 ttl pk-yrs)    Types: Cigarettes    Start date: 08/04/1968   Smokeless tobacco: Never   Tobacco comments:    1-2 a week   Vaping Use   Vaping status: Never Used  Substance and Sexual Activity   Alcohol use: No    Alcohol/week: 0.0 standard drinks of alcohol   Drug use: No   Sexual activity: Yes  Other Topics Concern   Not on file  Social History Narrative   Married, 3 daughters.   Educ: HS + 2 yr technical school.   Occupation: mechanic--RETIRED.  Orig from [place] .   Relocated to [facility] 2016.   Tob: 50 pack-yr hx; cutting back as of 01/2015.   No alc or drugs.   Social Drivers of Corporate investment banker Strain: Low Risk  (10/07/2023)   Received from [facility]   Overall Financial Resource Strain (CARDIA)    Difficulty of Paying Living Expenses: Not hard at all  Food Insecurity: No Food Insecurity (10/07/2023)   Received from [facility]   Hunger Vital Sign    Within the past 12 months, you worried that your food would run out before you got the money to buy more.: Never true    Within the past 12 months, the food you bought just didn't last and you didn't have money to get more.: Never true  Transportation Needs: No Transportation Needs (10/07/2023)   Received from [facility] - Transportation    Lack of Transportation (Medical):  No    Lack of Transportation (Non-Medical): No  Physical Activity: Insufficiently Active (07/07/2022)   Received from [facility]   Exercise Vital Sign    On average, how many days per week do you engage in moderate to strenuous exercise (like a brisk walk)?: 3 days    On average, how many minutes do you engage in exercise at this level?: 30 min  Stress: No Stress Concern Present (07/07/2022)   Received from [facility] - Occupational Stress Questionnaire    Feeling of Stress : Only a little  Social Connections: Moderately Integrated (07/07/2022)   Received from [facility]   Social Network    How would you rate your social network (family, work, friends)?: Adequate participation with social networks  Intimate Partner Violence: Not At Risk (07/07/2022)   Received from [facility]   HITS    Over the last 12 months how often did your partner physically hurt you?: Never    Over the last 12 months how often did your partner insult you or talk down to you?: Never    Over the last 12 months how often did your partner threaten you with physical harm?: Never    Over the last 12 months how often did your partner scream or curse at you?: Never    Review of Systems: Gen: Denies any fever, chills, fatigue, weight loss, lack of appetite.  CV: Denies chest pain, heart palpitations, peripheral edema, syncope.  Resp: Denies shortness of breath at rest or with exertion. Denies wheezing or cough.  GI: Denies dysphagia or odynophagia. Denies jaundice, hematemesis, fecal incontinence. GU : Denies urinary burning, urinary frequency, urinary hesitancy MS: Denies joint pain, muscle weakness, cramps, or limitation of movement.  Derm: Denies rash, itching, dry skin Psych: Denies depression, anxiety, memory loss, and confusion Heme: Denies bruising, bleeding, and enlarged lymph nodes.  Physical Exam: There were no vitals taken for this visit.  General:   Alert  and oriented. Pleasant and cooperative. Well-nourished and well-developed.  Head:  Normocephalic and atraumatic. Eyes:  Without icterus, sclera clear and conjunctiva pink.  Ears:  Normal auditory acuity. Lungs:  Clear to auscultation bilaterally. No wheezes, rales, or rhonchi. No distress.  Heart:  S1, S2 present without murmurs appreciated.  Abdomen:  +BS, soft, non-tender and non-distended. No HSM noted. No guarding or rebound. No masses appreciated.  Rectal:  Deferred  Msk:  Symmetrical without gross deformities. Normal posture. Extremities:  Without edema. Neurologic:  Alert and  oriented x4;  grossly normal neurologically. Skin:  Intact without significant lesions or rashes. Psych:  Alert and cooperative. Normal mood and affect.    Assessment:     Plan:  ***   Josette Centers, PA-C [facility] 01/27/2024

## 2024-01-27 ENCOUNTER — Encounter: Payer: Self-pay | Admitting: Gastroenterology

## 2024-01-27 ENCOUNTER — Encounter: Payer: Self-pay | Admitting: *Deleted

## 2024-01-27 ENCOUNTER — Other Ambulatory Visit: Payer: Self-pay | Admitting: *Deleted

## 2024-01-27 ENCOUNTER — Ambulatory Visit: Admitting: Gastroenterology

## 2024-01-27 VITALS — BP 124/76 | HR 83 | Temp 98.0°F | Ht 73.5 in | Wt 194.8 lb

## 2024-01-27 DIAGNOSIS — R634 Abnormal weight loss: Secondary | ICD-10-CM

## 2024-01-27 DIAGNOSIS — K4090 Unilateral inguinal hernia, without obstruction or gangrene, not specified as recurrent: Secondary | ICD-10-CM | POA: Diagnosis not present

## 2024-01-27 DIAGNOSIS — Z860101 Personal history of adenomatous and serrated colon polyps: Secondary | ICD-10-CM

## 2024-01-27 DIAGNOSIS — R1310 Dysphagia, unspecified: Secondary | ICD-10-CM

## 2024-01-27 DIAGNOSIS — Z860100 Personal history of colon polyps, unspecified: Secondary | ICD-10-CM

## 2024-01-27 NOTE — Patient Instructions (Addendum)
 We will get you scheduled for an upper endoscopy with possible esophageal dilation with Dr. Cindie.   Due to your significant trouble swallowing, I recommend that you swallow a soft/full liquid diet for now.  We are referring you to general surgery for right inguinal hernia repair.  I will see back in the office after your upper endoscopy.  Josette Centers, PA-C [facility]

## 2024-01-29 ENCOUNTER — Ambulatory Visit (HOSPITAL_BASED_OUTPATIENT_CLINIC_OR_DEPARTMENT_OTHER): Payer: Self-pay | Admitting: Anesthesiology

## 2024-01-29 ENCOUNTER — Encounter (HOSPITAL_COMMUNITY)
Admission: RE | Disposition: A | Discharge status: Home / Self Care | Payer: Self-pay | Source: Home / Self Care | Attending: Internal Medicine

## 2024-01-29 ENCOUNTER — Ambulatory Visit (HOSPITAL_COMMUNITY)
Admission: RE | Admit: 2024-01-29 | Discharge: 2024-01-29 | Disposition: A | Discharge status: Home / Self Care | Attending: Internal Medicine | Admitting: Internal Medicine

## 2024-01-29 ENCOUNTER — Other Ambulatory Visit: Payer: Self-pay

## 2024-01-29 ENCOUNTER — Ambulatory Visit (HOSPITAL_COMMUNITY): Payer: Self-pay | Admitting: Anesthesiology

## 2024-01-29 ENCOUNTER — Encounter (HOSPITAL_COMMUNITY): Payer: Self-pay | Admitting: Internal Medicine

## 2024-01-29 DIAGNOSIS — J449 Chronic obstructive pulmonary disease, unspecified: Secondary | ICD-10-CM | POA: Insufficient documentation

## 2024-01-29 DIAGNOSIS — F17200 Nicotine dependence, unspecified, uncomplicated: Secondary | ICD-10-CM

## 2024-01-29 DIAGNOSIS — I10 Essential (primary) hypertension: Secondary | ICD-10-CM | POA: Diagnosis not present

## 2024-01-29 DIAGNOSIS — K222 Esophageal obstruction: Secondary | ICD-10-CM | POA: Insufficient documentation

## 2024-01-29 DIAGNOSIS — I2510 Atherosclerotic heart disease of native coronary artery without angina pectoris: Secondary | ICD-10-CM | POA: Insufficient documentation

## 2024-01-29 DIAGNOSIS — B379 Candidiasis, unspecified: Secondary | ICD-10-CM

## 2024-01-29 DIAGNOSIS — N182 Chronic kidney disease, stage 2 (mild): Secondary | ICD-10-CM | POA: Diagnosis not present

## 2024-01-29 DIAGNOSIS — K2289 Other specified disease of esophagus: Secondary | ICD-10-CM | POA: Insufficient documentation

## 2024-01-29 DIAGNOSIS — K648 Other hemorrhoids: Secondary | ICD-10-CM | POA: Diagnosis not present

## 2024-01-29 DIAGNOSIS — R1310 Dysphagia, unspecified: Secondary | ICD-10-CM | POA: Diagnosis not present

## 2024-01-29 DIAGNOSIS — I25119 Atherosclerotic heart disease of native coronary artery with unspecified angina pectoris: Secondary | ICD-10-CM

## 2024-01-29 HISTORY — PX: ESOPHAGOGASTRODUODENOSCOPY: SHX5428

## 2024-01-29 HISTORY — PX: ESOPHAGEAL DILATION: SHX303

## 2024-01-29 LAB — KOH PREP

## 2024-01-29 SURGERY — EGD (ESOPHAGOGASTRODUODENOSCOPY)
Anesthesia: General

## 2024-01-29 MED ORDER — PROPOFOL 10 MG/ML IV BOLUS
INTRAVENOUS | Status: DC | PRN
  Administered 2024-01-29: 125 ug/kg/min via INTRAVENOUS
  Administered 2024-01-29 (×2): 30 mg via INTRAVENOUS
  Administered 2024-01-29: 70 mg via INTRAVENOUS

## 2024-01-29 MED ORDER — LIDOCAINE 2% (20 MG/ML) 5 ML SYRINGE
INTRAMUSCULAR | Status: DC | PRN
  Administered 2024-01-29: 60 mg via INTRAVENOUS

## 2024-01-29 MED ORDER — LACTATED RINGERS IV SOLN: INTRAVENOUS | Status: DC

## 2024-01-29 NOTE — Transfer of Care (Addendum)
 Immediate Anesthesia Transfer of Care Note  Patient: Craig Mccann  Procedure(s) Performed: EGD (ESOPHAGOGASTRODUODENOSCOPY) DILATION, ESOPHAGUS  Patient Location: Endoscopy Unit  Anesthesia Type:General  Level of Consciousness: drowsy and patient cooperative  Airway & Oxygen Therapy: Patient Spontanous Breathing  Post-op Assessment: Report given to RN and Post -op Vital signs reviewed and stable  Post vital signs: Reviewed and stable  Last Vitals:  Vitals Value Taken Time  BP 98/56 01/29/24   0752  Temp 36.5 01/29/24   0752  Pulse 73 01/29/24   0752  Resp 14 01/29/24   0752  SpO2 96% 01/29/24   0752    Last Pain:  Vitals:   01/29/24 0736  TempSrc:   PainSc: 0-No pain      Patients Stated Pain Goal: 10 (01/29/24 0643)  Complications: No notable events documented.

## 2024-01-29 NOTE — Op Note (Signed)
 [facility] Patient Name: Craig Mccann Procedure Date: 01/29/2024 7:08 AM MRN: [id number] Date of Birth: 1951/07/01 Attending MD: Carlin POUR. Cindie , [place], [id number] CSN: [id number] Age: 73 Admit Type: Outpatient Procedure:                Upper GI endoscopy Indications:              Dysphagia Providers:                Carlin POUR. Cindie, DO, Rosina Sprague, Emilee Tubb                            RN, RN, Daphne Mulch Technician, Technician Referring MD:              Medicines:                See the Anesthesia note for documentation of the                            administered medications Complications:            No immediate complications. Estimated Blood Loss:     Estimated blood loss was minimal. Procedure:                Pre-Anesthesia Assessment:                           - The anesthesia plan was to use monitored                            anesthesia care (MAC).                           After obtaining informed consent, the endoscope was                            passed under direct vision. Throughout the                            procedure, the patient's blood pressure, pulse, and                            oxygen saturations were monitored continuously. The                            GIF-H190 ([id number]) scope was introduced through the                            mouth, and advanced to the second part of duodenum.                            The upper GI endoscopy was accomplished without                            difficulty. The patient tolerated the procedure                            well. Scope  In: 7:40:58 AM Scope Out: 7:46:04 AM Total Procedure Duration: 0 hours 5 minutes 6 seconds  Findings:      White nummular lesions were noted in the middle third of the esophagus.       Cells for cytology were obtained by brushing.      The Z-line was irregular and was found 45 cm from the incisors.      A mild Schatzki ring was found in the distal esophagus. A TTS dilator        was passed through the scope. Dilation with an 18-19-20 mm balloon       dilator was performed to 20 mm. The dilation site was examined and       showed moderate improvement in luminal narrowing.      The entire examined stomach was normal.      The duodenal bulb, first portion of the duodenum and second portion of       the duodenum were normal. Impression:               - White nummular lesions in esophageal mucosa.                            Cells for cytology obtained.                           - Z-line irregular, 45 cm from the incisors.                           - Mild Schatzki ring. Dilated.                           - Normal stomach.                           - Normal duodenal bulb, first portion of the                            duodenum and second portion of the duodenum. Moderate Sedation:      Per Anesthesia Care Recommendation:           - Patient has a contact number available for                            emergencies. The signs and symptoms of potential                            delayed complications were discussed with the                            patient. Return to normal activities tomorrow.                            Written discharge instructions were provided to the                            patient.                           -  Resume previous diet.                           - Continue present medications.                           - Await pathology results.                           - Repeat upper endoscopy PRN for retreatment.                           - Return to GI clinic in 8 weeks. Procedure Code(s):        --- Professional ---                           [phone], Esophagogastroduodenoscopy, flexible,                            transoral; with transendoscopic balloon dilation of                            esophagus (less than 30 mm diameter) Diagnosis Code(s):        --- Professional ---                           K22.89, Other specified disease of  esophagus                           K22.2, Esophageal obstruction                           R13.10, Dysphagia, unspecified CPT copyright 2022 American Medical Association. All rights reserved. The codes documented in this report are preliminary and upon coder review may  be revised to meet current compliance requirements. Carlin POUR. Cindie, DO Carlin POUR. Cindie, DO 01/29/2024 7:50:11 AM This report has been signed electronically. Number of Addenda: 0

## 2024-01-29 NOTE — Anesthesia Preprocedure Evaluation (Signed)
 Anesthesia Evaluation  Patient identified by MRN, date of birth, ID band Patient awake    Reviewed: Allergy & Precautions, H&P , NPO status , Patient's Chart, lab work & pertinent test results, reviewed documented beta blocker date and time   History of Anesthesia Complications (+) POST - OP SPINAL HEADACHE and history of anesthetic complications  Airway Mallampati: II  TM Distance: >3 FB Neck ROM: full    Dental no notable dental hx.    Pulmonary neg pulmonary ROS, pneumonia, COPD, Current Smoker   Pulmonary exam normal breath sounds clear to auscultation       Cardiovascular Exercise Tolerance: Good hypertension, + angina  + CAD  negative cardio ROS  Rhythm:regular Rate:Normal     Neuro/Psych  Headaches PSYCHIATRIC DISORDERS      negative neurological ROS  negative psych ROS   GI/Hepatic negative GI ROS, Neg liver ROS,,,  Endo/Other  negative endocrine ROS    Renal/GU Renal diseasenegative Renal ROS  negative genitourinary   Musculoskeletal   Abdominal   Peds  Hematology negative hematology ROS (+)   Anesthesia Other Findings   Reproductive/Obstetrics negative OB ROS                             Anesthesia Physical Anesthesia Plan  ASA: 3  Anesthesia Plan: General   Post-op Pain Management:    Induction:   PONV Risk Score and Plan: Propofol  infusion  Airway Management Planned:   Additional Equipment:   Intra-op Plan:   Post-operative Plan:   Informed Consent: I have reviewed the patients History and Physical, chart, labs and discussed the procedure including the risks, benefits and alternatives for the proposed anesthesia with the patient or authorized representative who has indicated his/her understanding and acceptance.     Dental Advisory Given  Plan Discussed with: CRNA  Anesthesia Plan Comments:        Anesthesia Quick Evaluation

## 2024-01-29 NOTE — Interval H&P Note (Signed)
 History and Physical Interval Note:  01/29/2024 7:34 AM  Craig Mccann  has presented today for surgery, with the diagnosis of dysphagia.  The various methods of treatment have been discussed with the patient and family. After consideration of risks, benefits and other options for treatment, the patient has consented to  Procedure(s) with comments: EGD (ESOPHAGOGASTRODUODENOSCOPY) (N/A) - 7:30 am, ok rm 1/2 DILATION, ESOPHAGUS (N/A) as a surgical intervention.  The patient's history has been reviewed, patient examined, no change in status, stable for surgery.  I have reviewed the patient's chart and labs.  Questions were answered to the patient's satisfaction.     Carlin MARLA Hasty

## 2024-01-29 NOTE — Discharge Instructions (Addendum)
 EGD Discharge instructions Please read the instructions outlined below and refer to this sheet in the next few weeks. These discharge instructions provide you with general information on caring for yourself after you leave the hospital. Your doctor may also give you specific instructions. While your treatment has been planned according to the most current medical practices available, unavoidable complications occasionally occur. If you have any problems or questions after discharge, please call your doctor. ACTIVITY You may resume your regular activity but move at a slower pace for the next 24 hours.  Take frequent rest periods for the next 24 hours.  Walking will help expel (get rid of) the air and reduce the bloated feeling in your abdomen.  No driving for 24 hours (because of the anesthesia (medicine) used during the test).  You may shower.  Do not sign any important legal documents or operate any machinery for 24 hours (because of the anesthesia used during the test).  NUTRITION Drink plenty of fluids.  You may resume your normal diet.  Begin with a light meal and progress to your normal diet.  Avoid alcoholic beverages for 24 hours or as instructed by your caregiver.  MEDICATIONS You may resume your normal medications unless your caregiver tells you otherwise.  WHAT YOU CAN EXPECT TODAY You may experience abdominal discomfort such as a feeling of fullness or "gas" pains.  FOLLOW-UP Your doctor will discuss the results of your test with you.  SEEK IMMEDIATE MEDICAL ATTENTION IF ANY OF THE FOLLOWING OCCUR: Excessive nausea (feeling sick to your stomach) and/or vomiting.  Severe abdominal pain and distention (swelling).  Trouble swallowing.  Temperature over 101 F (37.8 C).  Rectal bleeding or vomiting of blood.   Your upper endoscopy revealed mild inflammation in the middle of your esophagus.  Possibly this is due to yeast.  I took samples today.  We will call with these  results.  You did have a mild tightening of your esophagus which I stretched out today.  Hopefully this helps with feeling of food getting stuck.  Stomach and small bowel were normal.  Follow-up in GI office in 8 weeks. Message sent to office and will get this appointment scheduled with you.    I hope you have a great rest of your week!  Carlin POUR. Cindie, D.O. Gastroenterology and Hepatology [facility]

## 2024-01-29 NOTE — Anesthesia Postprocedure Evaluation (Signed)
 Anesthesia Post Note  Patient: DARYAN BUELL  Procedure(s) Performed: EGD (ESOPHAGOGASTRODUODENOSCOPY) DILATION, ESOPHAGUS  Patient location during evaluation: Phase II Anesthesia Type: General Level of consciousness: awake Pain management: pain level controlled Vital Signs Assessment: post-procedure vital signs reviewed and stable Respiratory status: spontaneous breathing and respiratory function stable Cardiovascular status: blood pressure returned to baseline and stable Postop Assessment: no headache and no apparent nausea or vomiting Anesthetic complications: no Comments: Late entry   No notable events documented.   Last Vitals:  Vitals:   01/29/24 0652 01/29/24 0752  BP: (!) 143/88 (!) 98/56  Pulse: 62 73  Resp: (!) 23 14  Temp: 36.5 C 36.5 C  SpO2: 99% 96%    Last Pain:  Vitals:   01/29/24 0752  TempSrc: Oral  PainSc: 0-No pain                 Yvonna JINNY Bosworth

## 2024-02-01 ENCOUNTER — Encounter (HOSPITAL_COMMUNITY): Payer: Self-pay | Admitting: Internal Medicine

## 2024-02-10 DIAGNOSIS — B3781 Candidal esophagitis: Secondary | ICD-10-CM | POA: Diagnosis not present

## 2024-02-10 DIAGNOSIS — M7751 Other enthesopathy of right foot: Secondary | ICD-10-CM | POA: Diagnosis not present

## 2024-02-10 DIAGNOSIS — M25571 Pain in right ankle and joints of right foot: Secondary | ICD-10-CM | POA: Diagnosis not present

## 2024-02-16 ENCOUNTER — Ambulatory Visit (HOSPITAL_COMMUNITY)
Admission: RE | Admit: 2024-02-16 | Discharge: 2024-02-16 | Disposition: A | Discharge status: Home / Self Care | Source: Ambulatory Visit | Attending: Surgery | Admitting: Surgery

## 2024-02-16 DIAGNOSIS — I7121 Aneurysm of the ascending aorta, without rupture: Secondary | ICD-10-CM | POA: Insufficient documentation

## 2024-02-16 DIAGNOSIS — R918 Other nonspecific abnormal finding of lung field: Secondary | ICD-10-CM | POA: Diagnosis not present

## 2024-02-16 DIAGNOSIS — I7123 Aneurysm of the descending thoracic aorta, without rupture: Secondary | ICD-10-CM | POA: Diagnosis not present

## 2024-02-16 DIAGNOSIS — I712 Thoracic aortic aneurysm, without rupture, unspecified: Secondary | ICD-10-CM | POA: Diagnosis not present

## 2024-02-16 DIAGNOSIS — I714 Abdominal aortic aneurysm, without rupture, unspecified: Secondary | ICD-10-CM | POA: Diagnosis not present

## 2024-02-16 MED ORDER — IOHEXOL 350 MG/ML SOLN
75.0000 mL | Freq: Once | INTRAVENOUS | Status: AC | PRN
  Administered 2024-02-16: 75 mL via INTRAVENOUS

## 2024-02-17 ENCOUNTER — Ambulatory Visit: Payer: Self-pay | Admitting: Internal Medicine

## 2024-02-17 ENCOUNTER — Telehealth: Payer: Self-pay

## 2024-02-17 NOTE — Telephone Encounter (Signed)
 Phoned the pt and LMOVM for the pt to return call. (Dr Cindie advised in secure chat).

## 2024-02-18 ENCOUNTER — Other Ambulatory Visit: Payer: Self-pay | Admitting: Internal Medicine

## 2024-02-18 ENCOUNTER — Ambulatory Visit: Admitting: General Surgery

## 2024-02-18 ENCOUNTER — Encounter: Payer: Self-pay | Admitting: General Surgery

## 2024-02-18 VITALS — BP 124/83 | HR 76 | Temp 97.5°F | Resp 14 | Ht 73.5 in | Wt 200.0 lb

## 2024-02-18 DIAGNOSIS — K4091 Unilateral inguinal hernia, without obstruction or gangrene, recurrent: Secondary | ICD-10-CM

## 2024-02-18 MED ORDER — FLUCONAZOLE 200 MG PO TABS
ORAL_TABLET | ORAL | 0 refills | Status: DC
Start: 2024-02-18 — End: 2024-03-15

## 2024-02-18 NOTE — Telephone Encounter (Signed)
 Phoned and spoke with the pt and was advised that the nystatin swish is helping some but not a lot. Pt states he would really like if you send in something for him to take.

## 2024-02-18 NOTE — Telephone Encounter (Signed)
 Phoned the pt this morning LMOVM to return call

## 2024-02-19 NOTE — Progress Notes (Signed)
 Craig Mccann; [id number]; [birth year]   HPI Patient is a 73 year old white male with multiple medical problems who was referred to my care for evaluation and treatment of a right inguinal hernia.  He has been having worsening pain and swelling in the right groin region for many months.  It is starting to affect his daily lifestyle.  He is status post a right inguinal herniorrhaphy in 1984. Past Medical History:  Diagnosis Date   Acute prostatitis 01/2015   Allergy    Arthritis    Basal cell carcinoma 2017   Multiple: back, left shin, left arm   CAD (coronary artery disease)    a. 07/2017: cath showing 50-60% stenosis along the LCx with a FFR of 0.94 not being hemodynamically significant   CAP (community acquired pneumonia) 06/26/2015   Chronic renal insufficiency, stage II (mild)    stage II/II: CrCl 60s.   Complication of anesthesia    COPD (chronic obstructive pulmonary disease) (HCC) 05/07/2015   Long time smoker + COPD changes noted on lung ca screening CT   Diverticulitis    Encounter for screening for lung cancer    Screening CT ok 08/27/16--repeat in 1 yr recommended.   Family history of colon cancer in mother    Hearing impairment    hearing aids   History of adenomatous polyp of colon 12/24/2010   History of kidney stones    Hyperlipidemia 07/14/2016   started atorv 20 07/16/16   Kidney stones    bilat nonobstrucing renal calculi 2016, + left benign renal cysts   Melanoma (HCC)    R posterolateral neck and R nasal ala.  Most recent was left mid back 01/2016 (Dr. Eda).   Spinal headache    Tobacco dependence    CT chest for lung ca screning 04/2015 showed benign findings; 1 yr repeat recommended    Past Surgical History:  Procedure Laterality Date   CERVICAL FUSION     CHOLECYSTECTOMY  1987   COLONOSCOPY  12/24/10; 2014   Multiple TCSs: pt on 5 yr recall, next due 2019.   COLONOSCOPY N/A 10/09/2017   Procedure: COLONOSCOPY;  Surgeon: Harvey Margo CROME, MD;   [facility]: [facility];  Service: Endoscopy;  Laterality: N/A;  1:30   CORONARY PRESSURE/FFR STUDY N/A 07/14/2017   Procedure: INTRAVASCULAR PRESSURE WIRE/FFR STUDY;  Surgeon: Claudene Victory ORN, MD;  [facility]: [facility];  Service: Cardiovascular;  Laterality: N/A;   ESOPHAGEAL DILATION N/A 01/29/2024   Procedure: DILATION, ESOPHAGUS;  Surgeon: Cindie Carlin POUR, DO;  [facility]: [facility];  Service: Endoscopy;  Laterality: N/A;   ESOPHAGOGASTRODUODENOSCOPY N/A 01/29/2024   Procedure: EGD (ESOPHAGOGASTRODUODENOSCOPY);  Surgeon: Cindie Carlin POUR, DO;  [facility]: [facility];  Service: Endoscopy;  Laterality: N/A;  7:30 am, ok rm 1/2   INGUINAL HERNIA REPAIR  1984   JOINT REPLACEMENT     left   KNEE SURGERY Left 2015   cartilage repair   KNEE SURGERY Right 1997                LEFT HEART CATH AND CORONARY ANGIOGRAPHY N/A 07/14/2017   Procedure: LEFT HEART CATH AND CORONARY ANGIOGRAPHY;  Surgeon: Claudene Victory ORN, MD;  [facility]: [facility];  Service: Cardiovascular;  Laterality: N/A;   LEFT HEART CATH AND CORONARY ANGIOGRAPHY N/A 10/25/2020   Procedure: LEFT HEART CATH AND CORONARY ANGIOGRAPHY;  Surgeon: Burnard Debby LABOR, MD;  [facility]: [facility];  Service: Cardiovascular;  Laterality: N/A;   LEFT HEART  CATH AND CORONARY ANGIOGRAPHY N/A 12/12/2021   Procedure: LEFT HEART CATH AND CORONARY ANGIOGRAPHY;  Surgeon: [place]il[place], Peter M, MD;  Location: [facility];  Service: Cardiovascular;  Laterality: N/A;   LITHOTRIPSY     NECK SURGERY  1989   Thumb surgery Bilateral 2005   and 2006   TOTAL HIP ARTHROPLASTY Left 11/20/2020   Procedure: TOTAL HIP ARTHROPLASTY ANTERIOR APPROACH;  Surgeon: Beverley Evalene BIRCH, MD;  Location: [facility];  Service: Orthopedics;  Laterality: Left;   TOTAL KNEE ARTHROPLASTY Left 04/23/2016   Procedure: TOTAL KNEE ARTHROPLASTY;  Surgeon: Toribio JULIANNA Beverley, MD;  Location: [facility] OR;  Service: Orthopedics;  Laterality: Left;   TOTAL KNEE ARTHROPLASTY Right  05/27/2022   Procedure: TOTAL KNEE ARTHROPLASTY;  Surgeon: Beverley Evalene BIRCH, MD;  Location: [facility];  Service: Orthopedics;  Laterality: Right;    Family History  Problem Relation Age of Onset   Colon cancer Mother    Cancer Mother    Lung cancer Father    Cancer Father    Brain cancer Brother    Cancer Brother     Current Outpatient Medications on File Prior to Visit  Medication Sig Dispense Refill   albuterol  (VENTOLIN  HFA) 108 (90 Base) MCG/ACT inhaler Inhale 2 puffs into the lungs every 6 (six) hours as needed. 8 g 0   aspirin  EC 81 MG tablet Take 1 tablet (81 mg total) by mouth 2 (two) times daily. For DVT prophylaxis for 30 days after surgery. 60 tablet 0   Budeson-Glycopyrrol-Formoterol 160-9-4.8 MCG/ACT AERO Inhale 2 puffs into the lungs 2 (two) times daily.     carvedilol  (COREG ) 3.125 MG tablet Take 1 tablet (3.125 mg total) by mouth 2 (two) times daily. 60 tablet 6   ezetimibe  (ZETIA ) 10 MG tablet Take 1 tablet (10 mg total) by mouth daily. 30 tablet 6   famotidine  (PEPCID ) 40 MG tablet Take 40 mg by mouth daily as needed for heartburn or indigestion.     nitroGLYCERIN  (NITROSTAT ) 0.4 MG SL tablet DISSOLVE 1 TABLET UNDER THE TONGUE EVERY 5 MINUTES AS NEEDED FOR CHEST PAIN. DO NOT EXCEED A TOTAL OF 3 DOSES IN 15 MINUTES. 25 tablet 3   No current facility-administered medications on file prior to visit.    Allergies  Allergen Reactions   Codeine Hives   Lexiscan  [Regadenoson ] Other (See Comments)    Transient Heart Block   Valium [Diazepam] Anaphylaxis   Farxiga  [Dapagliflozin ] Other (See Comments)    Made pt feel down.    Quinolones     Patient with ATAA (ascending thoracic aortic aneurysm)    Social History   Substance and Sexual Activity  Alcohol Use No   Alcohol/week: 0.0 standard drinks of alcohol    Social History   Tobacco Use  Smoking Status Some Days   Current packs/day: 1.00   Average packs/day: 1 pack/day for 55.5 years (55.5 ttl pk-yrs)    Types: Cigarettes   Start date: 08/04/1968  Smokeless Tobacco Never  Tobacco Comments   1-2 a week     Review of Systems  Constitutional: Negative.   HENT: Negative.    Eyes: Negative.   Respiratory: Negative.    Cardiovascular: Negative.   Gastrointestinal: Negative.   Genitourinary:  Positive for frequency.  Musculoskeletal: Negative.   Skin: Negative.   Neurological: Negative.   Endo/Heme/Allergies: Negative.   Psychiatric/Behavioral: Negative.      Objective   Vitals:   02/18/24 1052  BP: 124/83  Pulse: 76  Resp: 14  Temp: (!) 97.5 F (36.4 C)  SpO2: 95%    Physical Exam Vitals reviewed.  Constitutional:      Appearance: Normal appearance. He is normal weight. He is not ill-appearing.  HENT:     Head: Normocephalic and atraumatic.  Cardiovascular:     Rate and Rhythm: Normal rate and regular rhythm.     Heart sounds: Normal heart sounds. No murmur heard.    No friction rub. No gallop.  Pulmonary:     Effort: Pulmonary effort is normal. No respiratory distress.     Breath sounds: Normal breath sounds. No stridor. No wheezing, rhonchi or rales.  Abdominal:     General: Bowel sounds are normal. There is no distension.     Palpations: Abdomen is soft. There is no mass.     Tenderness: There is no abdominal tenderness. There is no guarding or rebound.     Hernia: A hernia is present.     Comments: Small easily reducible right inguinal hernia.  Genitourinary:    Testes: Normal.  Skin:    General: Skin is warm and dry.  Neurological:     Mental Status: He is alert and oriented to person, place, and time.     Assessment  Recurrent right inguinal hernia Plan  Patient is scheduled for robotic assisted laparoscopic recurrent right inguinal herniorrhaphy with mesh on 03/04/2024.  The risks and benefits of the procedure including bleeding, infection, mesh use, and the possibility of recurrence of the hernia were fully explained to the patient, who gave informed  consent.

## 2024-02-22 NOTE — Addendum Note (Signed)
 Addended by: SAUNDRA TAWNI DEL on: 02/22/2024 09:47 AM   Modules accepted: Orders

## 2024-02-22 NOTE — Progress Notes (Signed)
 [address] [place] [address]             [phone]            Craig Mccann [id number] May 15, 1951   History of Present Illness:  [name] man is a 73 year old man with medical history of hypertension, hyperlipidemia, COPD, CAD, tobacco abuse presents for continued surveillance of ascending thoracic aortic aneurysm. CTA of chest on 02/16/2024 measured ascending thoracic aortic aneurysm at 4.1 cm.  Blood pressure today is elevated.  He does not check his blood pressure at home and reports that he does get elevated readings at office visits.  He is a current smoker and smokes 2 packs per day.  He is attempting to quit smoking but has not tried anything at this time.  He denies chest pain, shortness of breath and lower leg swelling.     Current Outpatient Medications on File Prior to Visit  Medication Sig Dispense Refill   albuterol  (VENTOLIN  HFA) 108 (90 Base) MCG/ACT inhaler Inhale 2 puffs into the lungs every 6 (six) hours as needed. 8 g 0   aspirin  EC 81 MG tablet Take 1 tablet (81 mg total) by mouth 2 (two) times daily. For DVT prophylaxis for 30 days after surgery. 60 tablet 0   Budeson-Glycopyrrol-Formoterol 160-9-4.8 MCG/ACT AERO Inhale 2 puffs into the lungs 2 (two) times daily.     carvedilol  (COREG ) 3.125 MG tablet Take 1 tablet (3.125 mg total) by mouth 2 (two) times daily. 60 tablet 6   ezetimibe  (ZETIA ) 10 MG tablet Take 1 tablet (10 mg total) by mouth daily. 30 tablet 6   famotidine  (PEPCID ) 40 MG tablet Take 40 mg by mouth daily as needed for heartburn or indigestion.     fluconazole  (DIFLUCAN ) 200 MG tablet Take 2 tablets on day 1 then 1 tablet daily thereafter for a total of 14 days. 15 tablet 0   nitroGLYCERIN  (NITROSTAT ) 0.4 MG SL tablet DISSOLVE 1 TABLET UNDER THE TONGUE EVERY 5 MINUTES AS NEEDED FOR CHEST PAIN. DO NOT EXCEED A TOTAL OF 3 DOSES IN 15 MINUTES. 25 tablet 3   No current facility-administered medications on  file prior to visit.     ROS: Review of Systems  Constitutional: Negative.  Negative for weight loss.  Respiratory:  Negative for shortness of breath.   Cardiovascular: Negative.  Negative for chest pain and leg swelling.      BP (!) 135/91   Pulse 62   Resp 18   Ht 6' 1 (1.854 m)   Wt 200 lb (90.7 kg)   SpO2 96%   BMI 26.39 kg/m   Physical Exam Constitutional:      Appearance: Normal appearance.  HENT:     Head: Normocephalic and atraumatic.  Cardiovascular:     Rate and Rhythm: Normal rate and regular rhythm.     Heart sounds: Normal heart sounds, S1 normal and S2 normal.  Pulmonary:     Effort: Pulmonary effort is normal.     Breath sounds: Normal breath sounds.  Skin:    General: Skin is warm and dry.  Neurological:     General: No focal deficit present.     Mental Status: He is alert and oriented to person, place, and time.       Imaging:  CLINICAL DATA:  Thoracic aortic aneurysm.   EXAM: CT ANGIOGRAPHY CHEST WITH CONTRAST   TECHNIQUE: Multidetector CT  imaging of the chest was performed using the standard protocol during bolus administration of intravenous contrast. Multiplanar CT image reconstructions and MIPs were obtained to evaluate the vascular anatomy.   RADIATION DOSE REDUCTION: This exam was performed according to the departmental dose-optimization program which includes automated exposure control, adjustment of the mA and/or kV according to patient size and/or use of iterative reconstruction technique.   CONTRAST:  75mL OMNIPAQUE  IOHEXOL  350 MG/ML SOLN   COMPARISON:  08/20/2023.   FINDINGS: Cardiovascular: Ascending thoracic aneurysm maximal diameter of 4.1 cm. Descending thoracic aorta is ectatic with a diameter 3.3 cm. At the esophageal hiatus there is at aneurysmal dilatation of the aorta with a diameter of 4.2 cm. No evidence of dissection. Normal heart size. No pericardial effusion.   Mediastinum/Nodes: No enlarged  mediastinal, hilar, or axillary lymph nodes. Thyroid  gland, trachea, and esophagus demonstrate no significant findings.   Lungs/Pleura: Emphysematous scarring most evident in the apices. Linear subsegmental atelectasis or scarring in the lingula. Dependent bibasilar subsegmental atelectasis with mild ground-glass changes.   Upper Abdomen: Partially imaged left renal cyst.   Musculoskeletal: Thoracic degenerative changes. No acute osseous abnormalities.   Review of the MIP images confirms the above findings.   IMPRESSION: 1. Emphysematous changes. 2. Bibasilar atelectasis. 3. Ascending thoracic aorta is aneurysmal, 4.1 cm. 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 4. No thoracic aortic dissection.     Electronically Signed   By: Fonda Field M.D.   On: 02/19/2024 08:58    A/P: Aneurysm of ascending aorta without rupture (HCC) -4.1 cm ascending thoracic aortic aneurysm measuring 4.1 cm.  This is stable compared to CTA of chest scans from 08/2023.  We discussed the natural history and and risk factors for growth of ascending aortic aneurysms. Discussed recommendations to minimize the risk of further expansion or dissection including careful blood pressure control, avoidance of contact sports and heavy lifting, attention to lipid management.  We covered the importance of smoking cessation and discussed techniques for for quitting.  Drink glasses of water  and brushing teeth with craving cigarettes.  Patient is agreeable to try nicorette gum to help with smoking cessation.  The patient does not yet meet surgical criteria of >5.5cm. The patient is aware of signs and symptoms of aortic dissection and when to present to the emergency department   Follow up in one year with CTA of chest     Risk Modification:  Statin:  not taking at this time  Smoking cessation instruction/counseling given:   counseled patient on the dangers of tobacco use, advised patient to stop smoking, and reviewed strategies to maximize success  Patient was counseled on importance of Blood Pressure Control  He is to start taking his blood pressure at home and  contact their Primary Care Physician if they start to have blood pressure readings over 130s/90s. Do not ever stop blood pressure medications on your own, unless instructed by healthcare professional.  Please avoid use of Fluoroquinolones as this can potentially increase your risk of Aortic Rupture and/or Dissection  Patient educated on signs and symptoms of Aortic Dissection, handout also provided in AVS  Manuelita CHRISTELLA Rough, PA-C 02/23/24

## 2024-02-23 ENCOUNTER — Ambulatory Visit: Attending: Surgery

## 2024-02-23 VITALS — BP 135/91 | HR 62 | Resp 18 | Ht 73.0 in | Wt 200.0 lb

## 2024-02-23 DIAGNOSIS — I7121 Aneurysm of the ascending aorta, without rupture: Secondary | ICD-10-CM

## 2024-02-23 NOTE — Patient Instructions (Signed)

## 2024-02-29 NOTE — H&P (Signed)
 Craig Mccann; [id number]; 12/23/1950   HPI Patient is a 73 year old white male with multiple medical problems who was referred to my care for evaluation and treatment of a right inguinal hernia.  He has been having worsening pain and swelling in the right groin region for many months.  It is starting to affect his daily lifestyle.  He is status post a right inguinal herniorrhaphy in 1984. Past Medical History:  Diagnosis Date   Acute prostatitis 01/2015   Allergy    Arthritis    Basal cell carcinoma 2017   Multiple: back, left shin, left arm   CAD (coronary artery disease)    a. 07/2017: cath showing 50-60% stenosis along the LCx with a FFR of 0.94 not being hemodynamically significant   CAP (community acquired pneumonia) 06/26/2015   Chronic renal insufficiency, stage II (mild)    stage II/II: CrCl 60s.   Complication of anesthesia    COPD (chronic obstructive pulmonary disease) (HCC) 05/07/2015   Long time smoker + COPD changes noted on lung ca screening CT   Diverticulitis    Encounter for screening for lung cancer    Screening CT ok 08/27/16--repeat in 1 yr recommended.   Family history of colon cancer in mother    Hearing impairment    hearing aids   History of adenomatous polyp of colon 12/24/2010   History of kidney stones    Hyperlipidemia 07/14/2016   started atorv 20 07/16/16   Kidney stones    bilat nonobstrucing renal calculi 2016, + left benign renal cysts   Melanoma (HCC)    R posterolateral neck and R nasal ala.  Most recent was left mid back 01/2016 (Dr. Eda).   Spinal headache    Tobacco dependence    CT chest for lung ca screning 04/2015 showed benign findings; 1 yr repeat recommended    Past Surgical History:  Procedure Laterality Date   CERVICAL FUSION     CHOLECYSTECTOMY  1987   COLONOSCOPY  12/24/10; 2014   Multiple TCSs: pt on 5 yr recall, next due 2019.   COLONOSCOPY N/A 10/09/2017   Procedure: COLONOSCOPY;  Surgeon: Harvey Margo CROME, MD;   [facility];  Service: Endoscopy;  Laterality: N/A;  1:30   CORONARY PRESSURE/FFR STUDY N/A 07/14/2017   Procedure: INTRAVASCULAR PRESSURE WIRE/FFR STUDY;  Surgeon: Claudene Victory ORN, MD;  [facility]: [facility];  Service: Cardiovascular;  Laterality: N/A;   ESOPHAGEAL DILATION N/A 01/29/2024   Procedure: DILATION, ESOPHAGUS;  Surgeon: Cindie Carlin POUR, DO;  [facility];  Service: Endoscopy;  Laterality: N/A;   ESOPHAGOGASTRODUODENOSCOPY N/A 01/29/2024   Procedure: EGD (ESOPHAGOGASTRODUODENOSCOPY);  Surgeon: Cindie Carlin POUR, DO;  [facility];  Service: Endoscopy;  Laterality: N/A;  7:30 am, ok rm 1/2   INGUINAL HERNIA REPAIR  1984   JOINT REPLACEMENT     left   KNEE SURGERY Left 2015   cartilage repair   KNEE SURGERY Right 1997                LEFT HEART CATH AND CORONARY ANGIOGRAPHY N/A 07/14/2017   Procedure: LEFT HEART CATH AND CORONARY ANGIOGRAPHY;  Surgeon: Claudene Victory ORN, MD;  [facility];  Service: Cardiovascular;  Laterality: N/A;   LEFT HEART CATH AND CORONARY ANGIOGRAPHY N/A 10/25/2020   Procedure: LEFT HEART CATH AND CORONARY ANGIOGRAPHY;  Surgeon: Burnard Debby LABOR, MD;  [facility];  Service: Cardiovascular;  Laterality: N/A;   LEFT HEART  CATH AND CORONARY ANGIOGRAPHY N/A 12/12/2021   Procedure: LEFT HEART CATH AND CORONARY ANGIOGRAPHY;  Surgeon: [place]azil[place], Peter M, MD;  Location: [facility];  Service: Cardiovascular;  Laterality: N/A;   LITHOTRIPSY     NECK SURGERY  1989   Thumb surgery Bilateral 2005   and 2006   TOTAL HIP ARTHROPLASTY Left 11/20/2020   Procedure: TOTAL HIP ARTHROPLASTY ANTERIOR APPROACH;  Surgeon: Beverley Evalene BIRCH, MD;  Location: [facility];  Service: Orthopedics;  Laterality: Left;   TOTAL KNEE ARTHROPLASTY Left 04/23/2016   Procedure: TOTAL KNEE ARTHROPLASTY;  Surgeon: Toribio JULIANNA Beverley, MD;  Location: [facility] OR;  Service: Orthopedics;  Laterality: Left;   TOTAL KNEE ARTHROPLASTY Right  05/27/2022   Procedure: TOTAL KNEE ARTHROPLASTY;  Surgeon: Beverley Evalene BIRCH, MD;  Location: [facility];  Service: Orthopedics;  Laterality: Right;    Family History  Problem Relation Age of Onset   Colon cancer Mother    Cancer Mother    Lung cancer Father    Cancer Father    Brain cancer Brother    Cancer Brother     No current facility-administered medications on file prior to encounter.   Current Outpatient Medications on File Prior to Encounter  Medication Sig Dispense Refill   albuterol  (VENTOLIN  HFA) 108 (90 Base) MCG/ACT inhaler Inhale 2 puffs into the lungs every 6 (six) hours as needed. 8 g 0   aspirin  EC 81 MG tablet Take 1 tablet (81 mg total) by mouth 2 (two) times daily. For DVT prophylaxis for 30 days after surgery. (Patient taking differently: Take 81 mg by mouth daily.) 60 tablet 0   carvedilol  (COREG ) 3.125 MG tablet Take 1 tablet (3.125 mg total) by mouth 2 (two) times daily. 60 tablet 6   ezetimibe  (ZETIA ) 10 MG tablet Take 1 tablet (10 mg total) by mouth daily. 30 tablet 6   famotidine  (PEPCID ) 40 MG tablet Take 40 mg by mouth daily as needed for heartburn or indigestion.     fluconazole  (DIFLUCAN ) 200 MG tablet Take 2 tablets on day 1 then 1 tablet daily thereafter for a total of 14 days. 15 tablet 0   nitroGLYCERIN  (NITROSTAT ) 0.4 MG SL tablet DISSOLVE 1 TABLET UNDER THE TONGUE EVERY 5 MINUTES AS NEEDED FOR CHEST PAIN. DO NOT EXCEED A TOTAL OF 3 DOSES IN 15 MINUTES. 25 tablet 3   Budeson-Glycopyrrol-Formoterol 160-9-4.8 MCG/ACT AERO Inhale 2 puffs into the lungs 2 (two) times daily as needed (shortness of breath).      Allergies  Allergen Reactions   Codeine Hives   Lexiscan  [Regadenoson ] Other (See Comments)    Transient Heart Block   Valium [Diazepam] Anaphylaxis   Farxiga  [Dapagliflozin ] Other (See Comments)    Made pt feel down.    Quinolones     Patient with ATAA (ascending thoracic aortic aneurysm)    Social History   Substance and Sexual Activity   Alcohol Use No   Alcohol/week: 0.0 standard drinks of alcohol    Social History   Tobacco Use  Smoking Status Some Days   Current packs/day: 1.00   Average packs/day: 1 pack/day for 55.6 years (55.6 ttl pk-yrs)   Types: Cigarettes   Start date: 08/04/1968  Smokeless Tobacco Never  Tobacco Comments   1-2 a week     Review of Systems  Constitutional: Negative.   HENT: Negative.    Eyes: Negative.   Respiratory: Negative.    Cardiovascular: Negative.   Gastrointestinal: Negative.   Genitourinary:  Positive for frequency.  Musculoskeletal: Negative.   Skin: Negative.   Neurological: Negative.   Endo/Heme/Allergies: Negative.   Psychiatric/Behavioral: Negative.      Objective   There were no vitals filed for this visit.   Physical Exam Vitals reviewed.  Constitutional:      Appearance: Normal appearance. He is normal weight. He is not ill-appearing.  HENT:     Head: Normocephalic and atraumatic.  Cardiovascular:     Rate and Rhythm: Normal rate and regular rhythm.     Heart sounds: Normal heart sounds. No murmur heard.    No friction rub. No gallop.  Pulmonary:     Effort: Pulmonary effort is normal. No respiratory distress.     Breath sounds: Normal breath sounds. No stridor. No wheezing, rhonchi or rales.  Abdominal:     General: Bowel sounds are normal. There is no distension.     Palpations: Abdomen is soft. There is no mass.     Tenderness: There is no abdominal tenderness. There is no guarding or rebound.     Hernia: A hernia is present.     Comments: Small easily reducible right inguinal hernia.  Genitourinary:    Testes: Normal.  Skin:    General: Skin is warm and dry.  Neurological:     Mental Status: He is alert and oriented to person, place, and time.     Assessment  Recurrent right inguinal hernia Plan  Patient is scheduled for robotic assisted laparoscopic recurrent right inguinal herniorrhaphy with mesh on 03/04/2024.  The risks and benefits  of the procedure including bleeding, infection, mesh use, and the possibility of recurrence of the hernia were fully explained to the patient, who gave informed consent.

## 2024-03-01 ENCOUNTER — Encounter (HOSPITAL_COMMUNITY): Payer: Self-pay

## 2024-03-01 ENCOUNTER — Other Ambulatory Visit: Payer: Self-pay

## 2024-03-01 ENCOUNTER — Encounter (HOSPITAL_COMMUNITY)
Admission: RE | Admit: 2024-03-01 | Discharge: 2024-03-01 | Disposition: A | Discharge status: Home / Self Care | Source: Ambulatory Visit | Attending: General Surgery | Admitting: General Surgery

## 2024-03-04 ENCOUNTER — Encounter (HOSPITAL_COMMUNITY)
Admission: RE | Disposition: A | Discharge status: Home / Self Care | Payer: Self-pay | Source: Home / Self Care | Attending: General Surgery

## 2024-03-04 ENCOUNTER — Ambulatory Visit (HOSPITAL_COMMUNITY)
Admission: RE | Admit: 2024-03-04 | Discharge: 2024-03-04 | Disposition: A | Discharge status: Home / Self Care | Attending: General Surgery | Admitting: General Surgery

## 2024-03-04 ENCOUNTER — Ambulatory Visit (HOSPITAL_COMMUNITY): Payer: Self-pay | Admitting: Anesthesiology

## 2024-03-04 DIAGNOSIS — I10 Essential (primary) hypertension: Secondary | ICD-10-CM

## 2024-03-04 DIAGNOSIS — J449 Chronic obstructive pulmonary disease, unspecified: Secondary | ICD-10-CM | POA: Insufficient documentation

## 2024-03-04 DIAGNOSIS — F17200 Nicotine dependence, unspecified, uncomplicated: Secondary | ICD-10-CM

## 2024-03-04 DIAGNOSIS — K4091 Unilateral inguinal hernia, without obstruction or gangrene, recurrent: Secondary | ICD-10-CM | POA: Insufficient documentation

## 2024-03-04 DIAGNOSIS — I25119 Atherosclerotic heart disease of native coronary artery with unspecified angina pectoris: Secondary | ICD-10-CM

## 2024-03-04 DIAGNOSIS — F17210 Nicotine dependence, cigarettes, uncomplicated: Secondary | ICD-10-CM | POA: Insufficient documentation

## 2024-03-04 HISTORY — PX: XI ROBOTIC ASSISTED INGUINAL HERNIA REPAIR WITH MESH: SHX6706

## 2024-03-04 SURGERY — REPAIR, HERNIA, INGUINAL, ROBOT-ASSISTED, LAPAROSCOPIC, USING MESH
Anesthesia: General | Site: Inguinal | Laterality: Right

## 2024-03-04 MED ORDER — LACTATED RINGERS IV SOLN: INTRAVENOUS | Status: DC

## 2024-03-04 MED ORDER — GLYCOPYRROLATE PF 0.2 MG/ML IJ SOSY
PREFILLED_SYRINGE | INTRAMUSCULAR | Status: AC
Start: 2024-03-04 — End: 2024-03-04
  Filled 2024-03-04: qty 1

## 2024-03-04 MED ORDER — CHLORHEXIDINE GLUCONATE CLOTH 2 % EX PADS
6.0000 | MEDICATED_PAD | Freq: Once | CUTANEOUS | Status: AC
  Administered 2024-03-04: 6 via TOPICAL

## 2024-03-04 MED ORDER — EPHEDRINE 5 MG/ML INJ
INTRAVENOUS | Status: AC
  Filled 2024-03-04: qty 15

## 2024-03-04 MED ORDER — OXYCODONE HCL 5 MG PO TABS: 5.0000 mg | ORAL_TABLET | Freq: Once | ORAL | Status: DC | PRN

## 2024-03-04 MED ORDER — SUGAMMADEX SODIUM 200 MG/2ML IV SOLN
INTRAVENOUS | Status: DC | PRN
  Administered 2024-03-04: 200 mg via INTRAVENOUS

## 2024-03-04 MED ORDER — TRAMADOL HCL 50 MG PO TABS: 50.0000 mg | ORAL_TABLET | Freq: Four times a day (QID) | ORAL | 0 refills | Status: DC | PRN

## 2024-03-04 MED ORDER — FENTANYL CITRATE (PF) 100 MCG/2ML IJ SOLN
INTRAMUSCULAR | Status: AC
Start: 2024-03-04 — End: 2024-03-04
  Filled 2024-03-04: qty 2

## 2024-03-04 MED ORDER — DEXAMETHASONE SODIUM PHOSPHATE 10 MG/ML IJ SOLN
INTRAMUSCULAR | Status: DC | PRN
Start: 2024-03-04 — End: 2024-03-04
  Administered 2024-03-04: 10 mg via INTRAVENOUS

## 2024-03-04 MED ORDER — FENTANYL CITRATE (PF) 100 MCG/2ML IJ SOLN
INTRAMUSCULAR | Status: AC
  Filled 2024-03-04: qty 2

## 2024-03-04 MED ORDER — CEFAZOLIN SODIUM-DEXTROSE 2-4 GM/100ML-% IV SOLN
2.0000 g | INTRAVENOUS | Status: AC
  Administered 2024-03-04: 2 g via INTRAVENOUS

## 2024-03-04 MED ORDER — MIDAZOLAM HCL 2 MG/2ML IJ SOLN
INTRAMUSCULAR | Status: DC | PRN
  Administered 2024-03-04: 2 mg via INTRAVENOUS

## 2024-03-04 MED ORDER — LIDOCAINE 2% (20 MG/ML) 5 ML SYRINGE
INTRAMUSCULAR | Status: AC
  Filled 2024-03-04: qty 10

## 2024-03-04 MED ORDER — EPHEDRINE SULFATE-NACL 50-0.9 MG/10ML-% IV SOSY
PREFILLED_SYRINGE | INTRAVENOUS | Status: DC | PRN
  Administered 2024-03-04: 10 mg via INTRAVENOUS
  Administered 2024-03-04: 5 mg via INTRAVENOUS

## 2024-03-04 MED ORDER — PROPOFOL 10 MG/ML IV BOLUS
INTRAVENOUS | Status: AC
Start: 2024-03-04 — End: 2024-03-04
  Filled 2024-03-04: qty 20

## 2024-03-04 MED ORDER — PROPOFOL 10 MG/ML IV BOLUS
INTRAVENOUS | Status: DC | PRN
  Administered 2024-03-04: 150 mg via INTRAVENOUS

## 2024-03-04 MED ORDER — CHLORHEXIDINE GLUCONATE CLOTH 2 % EX PADS: 6.0000 | MEDICATED_PAD | Freq: Once | CUTANEOUS | Status: DC

## 2024-03-04 MED ORDER — DEXAMETHASONE SODIUM PHOSPHATE 10 MG/ML IJ SOLN
INTRAMUSCULAR | Status: AC
  Filled 2024-03-04: qty 1

## 2024-03-04 MED ORDER — ONDANSETRON HCL 4 MG/2ML IJ SOLN
INTRAMUSCULAR | Status: DC | PRN
Start: 2024-03-04 — End: 2024-03-04
  Administered 2024-03-04: 4 mg via INTRAVENOUS

## 2024-03-04 MED ORDER — ONDANSETRON HCL 4 MG/2ML IJ SOLN
INTRAMUSCULAR | Status: AC
  Filled 2024-03-04: qty 2

## 2024-03-04 MED ORDER — ORAL CARE MOUTH RINSE: 15.0000 mL | Freq: Once | OROMUCOSAL | Status: AC

## 2024-03-04 MED ORDER — DEXMEDETOMIDINE HCL IN NACL 80 MCG/20ML IV SOLN
INTRAVENOUS | Status: DC | PRN
  Administered 2024-03-04: 20 ug via INTRAVENOUS

## 2024-03-04 MED ORDER — PHENYLEPHRINE 80 MCG/ML (10ML) SYRINGE FOR IV PUSH (FOR BLOOD PRESSURE SUPPORT)
PREFILLED_SYRINGE | INTRAVENOUS | Status: DC | PRN
  Administered 2024-03-04: 80 ug via INTRAVENOUS
  Administered 2024-03-04 (×3): 160 ug via INTRAVENOUS

## 2024-03-04 MED ORDER — CHLORHEXIDINE GLUCONATE 0.12 % MT SOLN
15.0000 mL | Freq: Once | OROMUCOSAL | Status: AC
  Administered 2024-03-04: 15 mL via OROMUCOSAL

## 2024-03-04 MED ORDER — GLYCOPYRROLATE PF 0.2 MG/ML IJ SOSY
PREFILLED_SYRINGE | INTRAMUSCULAR | Status: DC | PRN
  Administered 2024-03-04: .2 mg via INTRAVENOUS

## 2024-03-04 MED ORDER — FENTANYL CITRATE (PF) 100 MCG/2ML IJ SOLN
INTRAMUSCULAR | Status: DC | PRN
  Administered 2024-03-04 (×2): 100 ug via INTRAVENOUS

## 2024-03-04 MED ORDER — SUGAMMADEX SODIUM 200 MG/2ML IV SOLN
INTRAVENOUS | Status: AC
Start: 2024-03-04 — End: 2024-03-04
  Filled 2024-03-04: qty 2

## 2024-03-04 MED ORDER — OXYCODONE HCL 5 MG/5ML PO SOLN: 5.0000 mg | Freq: Once | ORAL | Status: DC | PRN

## 2024-03-04 MED ORDER — ROCURONIUM BROMIDE 10 MG/ML (PF) SYRINGE
PREFILLED_SYRINGE | INTRAVENOUS | Status: AC
  Filled 2024-03-04: qty 20

## 2024-03-04 MED ORDER — KETOROLAC TROMETHAMINE 30 MG/ML IJ SOLN
15.0000 mg | Freq: Once | INTRAMUSCULAR | Status: AC
  Administered 2024-03-04: 15 mg via INTRAVENOUS
  Filled 2024-03-04: qty 1

## 2024-03-04 MED ORDER — FENTANYL CITRATE PF 50 MCG/ML IJ SOSY: 25.0000 ug | PREFILLED_SYRINGE | INTRAMUSCULAR | Status: DC | PRN

## 2024-03-04 MED ORDER — LIDOCAINE 2% (20 MG/ML) 5 ML SYRINGE
INTRAMUSCULAR | Status: DC | PRN
  Administered 2024-03-04: 100 mg via INTRAVENOUS

## 2024-03-04 MED ORDER — BUPIVACAINE HCL (PF) 0.5 % IJ SOLN
INTRAMUSCULAR | Status: AC
Start: 2024-03-04 — End: 2024-03-04
  Filled 2024-03-04: qty 30

## 2024-03-04 MED ORDER — ROCURONIUM BROMIDE 10 MG/ML (PF) SYRINGE
PREFILLED_SYRINGE | INTRAVENOUS | Status: DC | PRN
Start: 2024-03-04 — End: 2024-03-04
  Administered 2024-03-04: 60 mg via INTRAVENOUS

## 2024-03-04 MED ORDER — MIDAZOLAM HCL 2 MG/2ML IJ SOLN
INTRAMUSCULAR | Status: AC
  Filled 2024-03-04: qty 2

## 2024-03-04 MED ORDER — CEFAZOLIN SODIUM-DEXTROSE 2-4 GM/100ML-% IV SOLN
INTRAVENOUS | Status: AC
  Filled 2024-03-04: qty 100

## 2024-03-04 MED ORDER — STERILE WATER FOR IRRIGATION IR SOLN
Status: DC | PRN
  Administered 2024-03-04: 500 mL

## 2024-03-04 MED ORDER — PHENYLEPHRINE 80 MCG/ML (10ML) SYRINGE FOR IV PUSH (FOR BLOOD PRESSURE SUPPORT)
PREFILLED_SYRINGE | INTRAVENOUS | Status: AC
  Filled 2024-03-04: qty 10

## 2024-03-04 MED ORDER — ONDANSETRON HCL 4 MG/2ML IJ SOLN: 4.0000 mg | Freq: Once | INTRAMUSCULAR | Status: DC | PRN

## 2024-03-04 MED ORDER — BUPIVACAINE HCL (PF) 0.5 % IJ SOLN
INTRAMUSCULAR | Status: DC | PRN
  Administered 2024-03-04: 30 mL

## 2024-03-04 SURGICAL SUPPLY — 39 items
CHLORAPREP W/TINT 26 (MISCELLANEOUS) ×2 IMPLANT
COVER LIGHT HANDLE (MISCELLANEOUS) IMPLANT
COVER MAYO STAND XLG (MISCELLANEOUS) ×2 IMPLANT
COVER TIP SHEARS 8 DVNC (MISCELLANEOUS) ×2 IMPLANT
DERMABOND ADVANCED .7 DNX12 (GAUZE/BANDAGES/DRESSINGS) ×2 IMPLANT
DRAPE ARM DVNC X/XI (DISPOSABLE) ×6 IMPLANT
DRAPE COLUMN DVNC XI (DISPOSABLE) ×2 IMPLANT
DRIVER NDL MEGA SUTCUT DVNCXI (INSTRUMENTS) ×2 IMPLANT
DRIVER NDLE MEGA SUTCUT DVNCXI (INSTRUMENTS) ×1 IMPLANT
ELECTRODE REM PT RTRN 9FT ADLT (ELECTROSURGICAL) ×2 IMPLANT
FORCEPS BPLR R/ABLATION 8 DVNC (INSTRUMENTS) ×2 IMPLANT
GAUZE SPONGE 4X4 12PLY STRL (GAUZE/BANDAGES/DRESSINGS) ×2 IMPLANT
GLOVE BIOGEL PI IND STRL 7.0 (GLOVE) ×8 IMPLANT
GLOVE SURG SS PI 7.5 STRL IVOR (GLOVE) ×4 IMPLANT
GOWN STRL REUS W/TWL LRG LVL3 (GOWN DISPOSABLE) ×4 IMPLANT
KIT PINK PAD W/HEAD ARM REST (MISCELLANEOUS) ×2 IMPLANT
KIT TURNOVER KIT A (KITS) ×2 IMPLANT
MANIFOLD NEPTUNE II (INSTRUMENTS) ×2 IMPLANT
MESH 3DMAX MID 5X7 RT XLRG (Mesh General) IMPLANT
NDL HYPO 21X1.5 SAFETY (NEEDLE) ×2 IMPLANT
NDL INSUFFLATION 14GA 120MM (NEEDLE) ×2 IMPLANT
NEEDLE HYPO 21X1.5 SAFETY (NEEDLE) ×1 IMPLANT
NEEDLE INSUFFLATION 14GA 120MM (NEEDLE) ×1 IMPLANT
OBTURATOR OPTICALSTD 8 DVNC (TROCAR) ×2 IMPLANT
PACK LAP CHOLE LZT030E (CUSTOM PROCEDURE TRAY) ×2 IMPLANT
PENCIL HANDSWITCHING (ELECTRODE) ×2 IMPLANT
POSITIONER HEAD 8X9X4 ADT (SOFTGOODS) ×2 IMPLANT
SCISSORS MNPLR CVD DVNC XI (INSTRUMENTS) ×2 IMPLANT
SEAL UNIV 5-12 XI (MISCELLANEOUS) ×6 IMPLANT
SET BASIN LINEN APH (SET/KITS/TRAYS/PACK) ×2 IMPLANT
SET TUBE SMOKE EVAC HIGH FLOW (TUBING) ×2 IMPLANT
SOL PREP POV-IOD 4OZ 10% (MISCELLANEOUS) ×2 IMPLANT
SUT MNCRL AB 4-0 PS2 18 (SUTURE) ×4 IMPLANT
SUT STRATA 3-0 SH (SUTURE) ×8 IMPLANT
SUT VIC AB 2-0 SH 27X BRD (SUTURE) ×2 IMPLANT
SYR 30ML LL (SYRINGE) ×2 IMPLANT
TAPE TRANSPORE STRL 2 31045 (GAUZE/BANDAGES/DRESSINGS) ×2 IMPLANT
TRAY FOL W/BAG SLVR 16FR STRL (SET/KITS/TRAYS/PACK) ×2 IMPLANT
WATER STERILE IRR 500ML POUR (IV SOLUTION) ×2 IMPLANT

## 2024-03-04 NOTE — Interval H&P Note (Signed)
 History and Physical Interval Note:  03/04/2024 9:12 AM  Craig Mccann  has presented today for surgery, with the diagnosis of HERNIA, INGUINAL, RIGHT, RECURRENT.  The various methods of treatment have been discussed with the patient and family. After consideration of risks, benefits and other options for treatment, the patient has consented to  Procedure(s) with comments: REPAIR, HERNIA, INGUINAL, ROBOT-ASSISTED, LAPAROSCOPIC, USING MESH (Right) - XI ROBTOIC ASSISTED W/ MESH as a surgical intervention.  The patient's history has been reviewed, patient examined, no change in status, stable for surgery.  I have reviewed the patient's chart and labs.  Questions were answered to the patient's satisfaction.     Oneil Budge

## 2024-03-04 NOTE — Transfer of Care (Signed)
 Immediate Anesthesia Transfer of Care Note  Patient: Craig Mccann  Procedure(s) Performed: REPAIR, HERNIA, INGUINAL, ROBOT-ASSISTED, LAPAROSCOPIC, USING MESH (Right: Inguinal)  Patient Location: PACU  Anesthesia Type:General  Level of Consciousness: awake, drowsy, and patient cooperative  Airway & Oxygen Therapy: Patient Spontanous Breathing and Patient connected to face mask oxygen  Post-op Assessment: Report given to RN, Post -op Vital signs reviewed and stable, and Patient moving all extremities X 4  Post vital signs: Reviewed and stable  Last Vitals:  Vitals Value Taken Time  BP 133/88 03/04/24 11:30  Temp    Pulse 81 03/04/24 11:33  Resp 15 03/04/24 11:33  SpO2 75 % 03/04/24 11:33  Vitals shown include unfiled device data.  Last Pain:  Vitals:   03/04/24 0815  PainSc: 0-No pain         Complications: No notable events documented.

## 2024-03-04 NOTE — OR Nursing (Signed)
 Patient in to PACU@ 1127. 1135 patient dropped sats and had to do head tilt chin lift multiple times and sats were in 70s. Sternal rub was done multiple times and he finally opened his eyes and started breathing more regularly. Dr Rebecka and Elvie Hacker, CRNA were in. After this, patient was fine. I questioned him about a sleep study and he states he had one done but could not tolerate the mask and wasn't going to wear it. I told him he should talk to PCP and possibly reconsider this, due to the severeness of his sleep apnea.

## 2024-03-04 NOTE — Op Note (Signed)
 Patient:  Craig Mccann  DOB:  07/31/1951  MRN:  [id number]   Preop Diagnosis: Recurrent right inguinal hernia  Postop Diagnosis: Same  Procedure: Robotic assisted laparoscopic recurrent right inguinal herniorrhaphy with mesh  Surgeon: Oneil Budge, MD  Anes: General endotracheal  Indications: Patient is a 73 year old white male who presents with a symptomatic recurrent right inguinal hernia.  The risks and benefits of the procedure including bleeding, infection, mesh use, the possibility of recurrence of the hernia were fully explained to the patient, who gave informed consent.  Procedure note: The patient was placed in the supine position.  After induction of general endotracheal anesthesia, the perineum and abdomen were prepped and draped using the usual sterile technique with Betadine  and ChloraPrep.  Surgical site confirmation was performed.  An incision was made in the left upper quadrant at Palmer's point.  A Veress needle was introduced into the abdominal cavity and confirmation of placement was done using the saline drop test.  The abdomen was then insufflated to 15 mmHg pressure.  An 8 mm trocar was introduced into the abdominal cavity under direct visualization without difficulty.  Additional 8 mm trocars were placed in the upper midline and right upper quadrant regions.  The patient was placed in Trendelenburg position.  The robot was then docked and targeted.  The patient had a small indirect recurrent right inguinal hernia.  A peritoneal flap was formed down to Cooper's ligament.  This was taken laterally.  The indirect hernia sac was freed away from the spermatic cord and a greater than 8 cm dissection posteriorly from the internal ring was performed.  An extra-large Bard 3D max mesh was then inserted and secured to Cooper's ligament using a 2-0 Vicryl interrupted suture.  Another tacking suture was placed along the anterior abdominal wall above the internal ring using a 2-0  Vicryl interrupted suture.  The peritoneal flap was then closed using a 3-0 STRATAFIX running suture.  A peritoneal flap defect was also closed using a 3-0 strata fix running suture.  The robot was undocked and all air was evacuated from the abdominal cavity prior to the removal of the trocars.  All wounds were irrigated with normal saline.  All wounds were injected with 0.5% Sensorcaine .  All incisions were closed using a 4-0 Monocryl subcuticular suture.  Dermabond was applied.  All tape and needle counts were correct at the end of the procedure.  The patient was extubated in the operating room and transferred to PACU in stable condition.  Complications: None  EBL: Minimal  Specimen: None

## 2024-03-04 NOTE — Anesthesia Procedure Notes (Signed)
 Procedure Name: Intubation Date/Time: 03/04/2024 10:01 AM  Performed by: Cordella Elvie HERO, CRNAPre-anesthesia Checklist: Patient identified, Emergency Drugs available, Suction available, Patient being monitored and Timeout performed Patient Re-evaluated:Patient Re-evaluated prior to induction Oxygen Delivery Method: Circle system utilized Preoxygenation: Pre-oxygenation with 100% oxygen Induction Type: IV induction Ventilation: Mask ventilation without difficulty Laryngoscope Size: Mac and 3 Grade View: Grade I Tube type: Oral Tube size: 7.5 mm Number of attempts: 1 Airway Equipment and Method: Stylet Placement Confirmation: ETT inserted through vocal cords under direct vision, positive ETCO2, breath sounds checked- equal and bilateral and CO2 detector Secured at: 23 cm Tube secured with: Tape Dental Injury: Teeth and Oropharynx as per pre-operative assessment

## 2024-03-04 NOTE — Anesthesia Preprocedure Evaluation (Signed)
 Anesthesia Evaluation  Patient identified by MRN, date of birth, ID band Patient awake    Reviewed: Allergy & Precautions, H&P , NPO status , Patient's Chart, lab work & pertinent test results, reviewed documented beta blocker date and time   History of Anesthesia Complications (+) POST - OP SPINAL HEADACHE and history of anesthetic complications  Airway Mallampati: II  TM Distance: >3 FB Neck ROM: full    Dental no notable dental hx.    Pulmonary neg pulmonary ROS, pneumonia, COPD, Current Smoker   Pulmonary exam normal breath sounds clear to auscultation       Cardiovascular Exercise Tolerance: Good hypertension, + angina  + CAD and + Cardiac Stents   Rhythm:regular Rate:Normal     Neuro/Psych  Headaches PSYCHIATRIC DISORDERS      negative neurological ROS  negative psych ROS   GI/Hepatic negative GI ROS, Neg liver ROS,,,  Endo/Other  negative endocrine ROS    Renal/GU Renal diseasenegative Renal ROS  negative genitourinary   Musculoskeletal   Abdominal   Peds  Hematology negative hematology ROS (+)   Anesthesia Other Findings   Reproductive/Obstetrics negative OB ROS                              Anesthesia Physical Anesthesia Plan  ASA: 3  Anesthesia Plan: General and General ETT   Post-op Pain Management:    Induction:   PONV Risk Score and Plan: Ondansetron   Airway Management Planned:   Additional Equipment:   Intra-op Plan:   Post-operative Plan:   Informed Consent: I have reviewed the patients History and Physical, chart, labs and discussed the procedure including the risks, benefits and alternatives for the proposed anesthesia with the patient or authorized representative who has indicated his/her understanding and acceptance.     Dental Advisory Given  Plan Discussed with: CRNA  Anesthesia Plan Comments:         Anesthesia Quick Evaluation

## 2024-03-04 NOTE — Anesthesia Postprocedure Evaluation (Signed)
 Anesthesia Post Note  Patient: Craig Mccann  Procedure(s) Performed: REPAIR, HERNIA, INGUINAL, ROBOT-ASSISTED, LAPAROSCOPIC, USING MESH (Right: Inguinal)  Patient location during evaluation: Phase II Anesthesia Type: General Level of consciousness: awake Pain management: pain level controlled Vital Signs Assessment: post-procedure vital signs reviewed and stable Respiratory status: spontaneous breathing and respiratory function stable Cardiovascular status: blood pressure returned to baseline and stable Postop Assessment: no headache and no apparent nausea or vomiting Anesthetic complications: no Comments: Late entry   No notable events documented.   Last Vitals:  Vitals:   03/04/24 1222 03/04/24 1231  BP: 112/78 120/80  Pulse: 69 65  Resp: 15 15  Temp: 36.7 C 36.6 C  SpO2: 100% 97%    Last Pain:  Vitals:   03/04/24 1231  TempSrc: Oral  PainSc: 0-No pain                 Yvonna JINNY Bosworth

## 2024-03-07 ENCOUNTER — Encounter (HOSPITAL_COMMUNITY): Payer: Self-pay | Admitting: General Surgery

## 2024-03-15 ENCOUNTER — Encounter: Payer: Self-pay | Admitting: General Surgery

## 2024-03-15 ENCOUNTER — Ambulatory Visit (INDEPENDENT_AMBULATORY_CARE_PROVIDER_SITE_OTHER): Admitting: General Surgery

## 2024-03-15 ENCOUNTER — Other Ambulatory Visit: Payer: Self-pay

## 2024-03-15 VITALS — BP 125/84 | HR 71 | Temp 98.2°F | Resp 16 | Ht 73.0 in | Wt 197.6 lb

## 2024-03-15 DIAGNOSIS — Z09 Encounter for follow-up examination after completed treatment for conditions other than malignant neoplasm: Secondary | ICD-10-CM

## 2024-03-15 NOTE — Progress Notes (Signed)
 Subjective:     Craig Mccann  Patient here for postoperative visit, status post robotic assisted laparoscopic recurrent right inguinal herniorrhaphy with mesh.  He is doing very well.  He has minimal incisional pain.  He has no complaints. Objective:    BP 125/84   Pulse 71   Temp 98.2 F (36.8 C) (Oral)   Resp 16   Ht 6' 1 (1.854 m)   Wt 197 lb 9.6 oz (89.6 kg)   SpO2 94%   BMI 26.07 kg/m   General:  alert, cooperative, and no distress  Abdomen soft, incisions healing well.  Right groin region without hematoma or seroma.     Assessment:    Doing well postoperatively.    Plan:   May gradually increase activity as able.  Follow-up here as needed.

## 2024-03-17 NOTE — Progress Notes (Deleted)
 Referring Provider: Suanne Pfeiffer, NP Primary Care Physician:  Suanne Pfeiffer, NP Primary GI Physician: Dr. Cindie  No chief complaint on file.   HPI:   Craig Mccann is a 73 y.o. male presenting today for follow-up of dysphagia. Also need to discuss scheduling surveillance colonoscopy due to history of colon polyps.  Last seen in our office at the time of initial consult sick/25/25.  He reported several months of solid food and liquid dysphagia that has been progressive since the first of the year.  No other significant GI symptoms though he did note unintentional weight loss.  He was also overdue for surveillance colonoscopy.  He will scheduled for EGD for dysphagia and plan to circle back to colonoscopy due to the acuity of dysphagia with need for ASAP procedure.  He was also referred to general surgery for right inguinal hernia.  EGD 01/29/2024: White nummular lesions in esophageal mucosa with cells obtained for cytology, irregular Z-line, mild Schatzki's ring dilated, normal stomach and duodenum. KOH prep was positive. He was prescribed diflucan.   He underwent robotic assisted inguinal hernia repair on 03/04/2024.  Today:  Dysphagia:    History of colon polyps:    Last colonoscopy 10/09/2017: - Six 3 to 6 mm polyps in the rectum, at the hepatic flexure and in the ascending colon, removed with a cold snare. Resected and retrieved.  - One 7 mm polyp at the hepatic flexure, removed with a hot snare. Resected and retrieved.  - Diverticulosis in the recto- sigmoid colon and in the sigmoid colon.  - External and internal hemorrhoids. - Tubular adenomas x 5, sessile serrated polyp x 3, and hyperplastic polyp. - Recommended 3-year surveillance.     Past Medical History:  Diagnosis Date   Acute prostatitis 01/2015   Allergy    Arthritis    Basal cell carcinoma 2017   Multiple: back, left shin, left arm   CAD (coronary artery disease)    a. 07/2017: cath showing 50-60%  stenosis along the LCx with a FFR of 0.94 not being hemodynamically significant   CAP (community acquired pneumonia) 06/26/2015   Chronic renal insufficiency, stage II (mild)    stage II/II: CrCl 60s.   Complication of anesthesia    COPD (chronic obstructive pulmonary disease) (HCC) 05/07/2015   Long time smoker + COPD changes noted on lung ca screening CT   Diverticulitis    Encounter for screening for lung cancer    Screening CT ok 08/27/16--repeat in 1 yr recommended.   Family history of colon cancer in mother    Hearing impairment    hearing aids   History of adenomatous polyp of colon 12/24/2010   History of kidney stones    Hyperlipidemia 07/14/2016   started atorv 20 07/16/16   Kidney stones    bilat nonobstrucing renal calculi 2016, + left benign renal cysts   Melanoma (HCC)    R posterolateral neck and R nasal ala.  Most recent was left mid back 01/2016 (Dr. Eda).   Spinal headache    Tobacco dependence    CT chest for lung ca screning 04/2015 showed benign findings; 1 yr repeat recommended    Past Surgical History:  Procedure Laterality Date   CERVICAL FUSION     CHOLECYSTECTOMY  1987   COLONOSCOPY  12/24/10; 2014   Multiple TCSs: pt on 5 yr recall, next due 2019.   COLONOSCOPY N/A 10/09/2017   Procedure: COLONOSCOPY;  Surgeon: Harvey Margo CROME, MD;  Location: [facility];  Service: Endoscopy;  Laterality: N/A;  1:30   CORONARY PRESSURE/FFR STUDY N/A 07/14/2017   Procedure: INTRAVASCULAR PRESSURE WIRE/FFR STUDY;  Surgeon: Claudene Victory ORN, MD;  Location: [facility];  Service: Cardiovascular;  Laterality: N/A;   ESOPHAGEAL DILATION N/A 01/29/2024   Procedure: DILATION, ESOPHAGUS;  Surgeon: Cindie Carlin POUR, DO;  Location: [facility];  Service: Endoscopy;  Laterality: N/A;   ESOPHAGOGASTRODUODENOSCOPY N/A 01/29/2024   Procedure: EGD (ESOPHAGOGASTRODUODENOSCOPY);  Surgeon: Cindie Carlin POUR, DO;  Location: [facility];  Service: Endoscopy;  Laterality: N/A;   7:30 am, ok rm 1/2   INGUINAL HERNIA REPAIR  1984   JOINT REPLACEMENT     left   KNEE SURGERY Left 2015   cartilage repair   KNEE SURGERY Right 1997                LEFT HEART CATH AND CORONARY ANGIOGRAPHY N/A 07/14/2017   Procedure: LEFT HEART CATH AND CORONARY ANGIOGRAPHY;  Surgeon: Claudene Victory ORN, MD;  Location: [facility];  Service: Cardiovascular;  Laterality: N/A;   LEFT HEART CATH AND CORONARY ANGIOGRAPHY N/A 10/25/2020   Procedure: LEFT HEART CATH AND CORONARY ANGIOGRAPHY;  Surgeon: Burnard Debby LABOR, MD;  Location: [facility];  Service: Cardiovascular;  Laterality: N/A;   LEFT HEART CATH AND CORONARY ANGIOGRAPHY N/A 12/12/2021   Procedure: LEFT HEART CATH AND CORONARY ANGIOGRAPHY;  Surgeon: [place], Peter M, MD;  Location: [facility] INVASIVE CV LAB;  Service: Cardiovascular;  Laterality: N/A;   LITHOTRIPSY     NECK SURGERY  1989   Thumb surgery Bilateral 2005   and 2006   TOTAL HIP ARTHROPLASTY Left 11/20/2020   Procedure: TOTAL HIP ARTHROPLASTY ANTERIOR APPROACH;  Surgeon: Beverley Evalene BIRCH, MD;  Location: [facility];  Service: Orthopedics;  Laterality: Left;   TOTAL KNEE ARTHROPLASTY Left 04/23/2016   Procedure: TOTAL KNEE ARTHROPLASTY;  Surgeon: Toribio JULIANNA Beverley, MD;  Location: [facility] OR;  Service: Orthopedics;  Laterality: Left;   TOTAL KNEE ARTHROPLASTY Right 05/27/2022   Procedure: TOTAL KNEE ARTHROPLASTY;  Surgeon: Beverley Evalene BIRCH, MD;  Location: [facility];  Service: Orthopedics;  Laterality: Right;   XI ROBOTIC ASSISTED INGUINAL HERNIA REPAIR WITH MESH Right 03/04/2024   Procedure: REPAIR, HERNIA, INGUINAL, ROBOT-ASSISTED, LAPAROSCOPIC, USING MESH;  Surgeon: Mavis Anes, MD;  Location: [facility];  Service: General;  Laterality: Right;  XI ROBTOIC ASSISTED W/ MESH    Current Outpatient Medications  Medication Sig Dispense Refill   aspirin EC 81 MG tablet Take 1 tablet (81 mg total) by mouth 2 (two) times daily. For DVT prophylaxis for 30 days after surgery. (Patient taking  differently: Take 81 mg by mouth daily.) 60 tablet 0   carvedilol (COREG) 3.125 MG tablet Take 1 tablet (3.125 mg total) by mouth 2 (two) times daily. 60 tablet 6   nitroGLYCERIN (NITROSTAT) 0.4 MG SL tablet DISSOLVE 1 TABLET UNDER THE TONGUE EVERY 5 MINUTES AS NEEDED FOR CHEST PAIN. DO NOT EXCEED A TOTAL OF 3 DOSES IN 15 MINUTES. 25 tablet 3   No current facility-administered medications for this visit.    Allergies as of 03/18/2024 - Review Complete 03/15/2024  Allergen Reaction Noted   Codeine Hives 01/31/2015   Lexiscan [regadenoson] Other (See Comments) 10/12/2020   Valium [diazepam] Anaphylaxis 01/31/2015   Farxiga [dapagliflozin] Other (See Comments) 10/31/2022   Quinolones  08/20/2023    Family History  Problem Relation Age of Onset   Colon cancer Mother    Cancer Mother  Lung cancer Father    Cancer Father    Brain cancer Brother    Cancer Brother     Social History   Socioeconomic History   Marital status: Married    Spouse name: Not on file   Number of children: Not on file   Years of education: Not on file   Highest education level: Not on file  Occupational History   Not on file  Tobacco Use   Smoking status: Some Days    Current packs/day: 1.00    Average packs/day: 1 pack/day for 55.6 years (55.6 ttl pk-yrs)    Types: Cigarettes    Start date: 08/04/1968   Smokeless tobacco: Never   Tobacco comments:    1-2 a week   Vaping Use   Vaping status: Never Used  Substance and Sexual Activity   Alcohol use: No    Alcohol/week: 0.0 standard drinks of alcohol   Drug use: No   Sexual activity: Yes  Other Topics Concern   Not on file  Social History Narrative   Married, 3 daughters.   Educ: HS + 2 yr technical school.   Occupation: mechanic--RETIRED.  Orig from [place] .   Relocated to [facility] 2016.   Tob: 50 pack-yr hx; cutting back as of 01/2015.   No alc or drugs.   Social Drivers of Corporate investment banker Strain: Low Risk  (10/07/2023)   Received  from [facility]   Overall Financial Resource Strain (CARDIA)    Difficulty of Paying Living Expenses: Not hard at all  Food Insecurity: No Food Insecurity (10/07/2023)   Received from [facility]   Hunger Vital Sign    Within the past 12 months, you worried that your food would run out before you got the money to buy more.: Never true    Within the past 12 months, the food you bought just didn't last and you didn't have money to get more.: Never true  Transportation Needs: No Transportation Needs (10/07/2023)   Received from [facility] - Transportation    Lack of Transportation (Medical): No    Lack of Transportation (Non-Medical): No  Physical Activity: Insufficiently Active (07/07/2022)   Received from [facility]   Exercise Vital Sign    On average, how many days per week do you engage in moderate to strenuous exercise (like a brisk walk)?: 3 days    On average, how many minutes do you engage in exercise at this level?: 30 min  Stress: No Stress Concern Present (07/07/2022)   Received from [facility] - Occupational Stress Questionnaire    Feeling of Stress : Only a little  Social Connections: Moderately Integrated (07/07/2022)   Received from [facility]   Social Network    How would you rate your social network (family, work, friends)?: Adequate participation with social networks    Review of Systems: Gen: Denies fever, chills, anorexia. Denies fatigue, weakness, weight loss.  CV: Denies chest pain, palpitations, syncope, peripheral edema, and claudication. Resp: Denies dyspnea at rest, cough, wheezing, coughing up blood, and pleurisy. GI: Denies vomiting blood, jaundice, and fecal incontinence.   Denies dysphagia or odynophagia. Derm: Denies rash, itching, dry skin Psych: Denies depression, anxiety, memory loss, confusion. No homicidal or suicidal ideation.  Heme: Denies bruising, bleeding, and enlarged lymph  nodes.  Physical Exam: There were no vitals taken for this visit. General:   Alert and oriented. No distress noted. Pleasant and cooperative.  Head:  Normocephalic and atraumatic. Eyes:  Conjuctiva clear without scleral icterus. Heart:  S1, S2 present without murmurs appreciated. Lungs:  Clear to auscultation bilaterally. No wheezes, rales, or rhonchi. No distress.  Abdomen:  +BS, soft, non-tender and non-distended. No rebound or guarding. No HSM or masses noted. Msk:  Symmetrical without gross deformities. Normal posture. Extremities:  Without edema. Neurologic:  Alert and  oriented x4 Psych:  Normal mood and affect.    Assessment:     Plan:  ***   Josette Centers, PA-C [facility] 03/18/2024

## 2024-03-18 ENCOUNTER — Ambulatory Visit: Admitting: Gastroenterology

## 2024-03-21 ENCOUNTER — Encounter: Payer: Self-pay | Admitting: Gastroenterology

## 2024-04-18 DIAGNOSIS — Z79899 Other long term (current) drug therapy: Secondary | ICD-10-CM | POA: Diagnosis not present

## 2024-04-18 DIAGNOSIS — E782 Mixed hyperlipidemia: Secondary | ICD-10-CM | POA: Diagnosis not present

## 2024-04-18 DIAGNOSIS — N401 Enlarged prostate with lower urinary tract symptoms: Secondary | ICD-10-CM | POA: Diagnosis not present

## 2024-04-18 DIAGNOSIS — M7671 Peroneal tendinitis, right leg: Secondary | ICD-10-CM | POA: Diagnosis not present

## 2024-04-18 DIAGNOSIS — R3912 Poor urinary stream: Secondary | ICD-10-CM | POA: Diagnosis not present

## 2024-04-18 DIAGNOSIS — S93491A Sprain of other ligament of right ankle, initial encounter: Secondary | ICD-10-CM | POA: Diagnosis not present

## 2024-04-18 DIAGNOSIS — M25571 Pain in right ankle and joints of right foot: Secondary | ICD-10-CM | POA: Diagnosis not present

## 2024-04-18 DIAGNOSIS — I7121 Aneurysm of the ascending aorta, without rupture: Secondary | ICD-10-CM | POA: Diagnosis not present

## 2024-04-18 DIAGNOSIS — N1831 Chronic kidney disease, stage 3a: Secondary | ICD-10-CM | POA: Diagnosis not present

## 2024-04-18 DIAGNOSIS — R7301 Impaired fasting glucose: Secondary | ICD-10-CM | POA: Diagnosis not present

## 2024-04-18 DIAGNOSIS — I5022 Chronic systolic (congestive) heart failure: Secondary | ICD-10-CM | POA: Diagnosis not present

## 2024-04-22 DIAGNOSIS — M25571 Pain in right ankle and joints of right foot: Secondary | ICD-10-CM | POA: Diagnosis not present

## 2024-05-02 DIAGNOSIS — S93491A Sprain of other ligament of right ankle, initial encounter: Secondary | ICD-10-CM | POA: Diagnosis not present

## 2024-05-02 DIAGNOSIS — M7671 Peroneal tendinitis, right leg: Secondary | ICD-10-CM | POA: Diagnosis not present

## 2024-05-03 DIAGNOSIS — D485 Neoplasm of uncertain behavior of skin: Secondary | ICD-10-CM | POA: Diagnosis not present

## 2024-05-03 DIAGNOSIS — L570 Actinic keratosis: Secondary | ICD-10-CM | POA: Diagnosis not present

## 2024-05-03 DIAGNOSIS — Z85820 Personal history of malignant melanoma of skin: Secondary | ICD-10-CM | POA: Diagnosis not present

## 2024-05-03 DIAGNOSIS — L821 Other seborrheic keratosis: Secondary | ICD-10-CM | POA: Diagnosis not present

## 2024-05-03 DIAGNOSIS — Z85828 Personal history of other malignant neoplasm of skin: Secondary | ICD-10-CM | POA: Diagnosis not present

## 2024-05-12 DIAGNOSIS — C44719 Basal cell carcinoma of skin of left lower limb, including hip: Secondary | ICD-10-CM | POA: Diagnosis not present

## 2024-05-20 ENCOUNTER — Ambulatory Visit: Admitting: Cardiology

## 2024-05-26 ENCOUNTER — Telehealth (HOSPITAL_BASED_OUTPATIENT_CLINIC_OR_DEPARTMENT_OTHER): Payer: Self-pay | Admitting: *Deleted

## 2024-05-26 ENCOUNTER — Telehealth (HOSPITAL_BASED_OUTPATIENT_CLINIC_OR_DEPARTMENT_OTHER): Payer: Self-pay

## 2024-05-26 NOTE — Telephone Encounter (Signed)
   Name: Craig Mccann  DOB: [birth year]  MRN: [id number]  Primary Cardiologist: Alvan Carrier, MD   Preoperative team, please contact this patient and set up a phone call appointment for further preoperative risk assessment. Please obtain consent and complete medication review. Thank you for your help.  I confirm that guidance regarding antiplatelet and oral anticoagulation therapy has been completed and, if necessary, noted below.  Regarding ASA therapy, we recommend continuation of ASA throughout the perioperative period.  However, if the surgeon feels that cessation of ASA is required in the perioperative period, it may be stopped 5-7 days prior to surgery with a plan to resume it as soon as felt to be feasible from a surgical standpoint in the post-operative period.  I also confirmed the patient resides in the state of [place] . As per [facility] [employer] telemedicine laws, the patient must reside in the state in which the provider is licensed.   Damien JAYSON Braver, NP 05/26/2024, 12:39 PM [facility]

## 2024-05-26 NOTE — Telephone Encounter (Signed)
   Pre-operative Risk Assessment    Patient Name: Craig Mccann  DOB: [birth year] MRN: [id number]   Date of last office visit: 01/22/24 With Dr. Alvan Date of next office visit: 07/26/24 with Dr. Alvan    Request for Surgical Clearance    Procedure:  Right Ankle Arthroscopy, open lateral ankle li  Date of Surgery:  Clearance 06/08/24                                 Surgeon:  Dr. Lillia [place] Surgeon's Group or Practice Name:  [facility]  Phone number:  [phone] Fax number:  [phone] attn: Katheryn Dustman   Type of Clearance Requested:   - Medical  - Pharmacy:  Hold Aspirin  not indicated   Type of Anesthesia:  General + regional   Additional requests/questions:    Bonney Augustin JONETTA Delores   05/26/2024, 12:27 PM

## 2024-05-26 NOTE — Telephone Encounter (Signed)
 Pt has been scheduled tele preop appt 06/01/24. Med rec and consent are done.      Patient Consent for Virtual Visit        Craig Mccann has provided verbal consent on 05/26/2024 for a virtual visit (video or telephone).   CONSENT FOR VIRTUAL VISIT FOR:  Craig Mccann  By participating in this virtual visit I agree to the following:  I hereby voluntarily request, consent and authorize [facility] and its employed or contracted physicians, physician assistants, nurse practitioners or other licensed health care professionals (the Practitioner), to provide me with telemedicine health care services (the "Services) as deemed necessary by the treating Practitioner. I acknowledge and consent to receive the Services by the Practitioner via telemedicine. I understand that the telemedicine visit will involve communicating with the Practitioner through live audiovisual communication technology and the disclosure of certain medical information by electronic transmission. I acknowledge that I have been given the opportunity to request an in-person assessment or other available alternative prior to the telemedicine visit and am voluntarily participating in the telemedicine visit.  I understand that I have the right to withhold or withdraw my consent to the use of telemedicine in the course of my care at any time, without affecting my right to future care or treatment, and that the Practitioner or I may terminate the telemedicine visit at any time. I understand that I have the right to inspect all information obtained and/or recorded in the course of the telemedicine visit and may receive copies of available information for a reasonable fee.  I understand that some of the potential risks of receiving the Services via telemedicine include:  Delay or interruption in medical evaluation due to technological equipment failure or disruption; Information transmitted may not be sufficient (e.g. poor  resolution of images) to allow for appropriate medical decision making by the Practitioner; and/or  In rare instances, security protocols could fail, causing a breach of personal health information.  Furthermore, I acknowledge that it is my responsibility to provide information about my medical history, conditions and care that is complete and accurate to the best of my ability. I acknowledge that Practitioner's advice, recommendations, and/or decision may be based on factors not within their control, such as incomplete or inaccurate data provided by me or distortions of diagnostic images or specimens that may result from electronic transmissions. I understand that the practice of medicine is not an exact science and that Practitioner makes no warranties or guarantees regarding treatment outcomes. I acknowledge that a copy of this consent can be made available to me via my patient portal [facility] MyChart), or I can request a printed copy by calling the office of [facility].    I understand that my insurance will be billed for this visit.   I have read or had this consent read to me. I understand the contents of this consent, which adequately explains the benefits and risks of the Services being provided via telemedicine.  I have been provided ample opportunity to ask questions regarding this consent and the Services and have had my questions answered to my satisfaction. I give my informed consent for the services to be provided through the use of telemedicine in my medical care

## 2024-05-26 NOTE — Telephone Encounter (Signed)
 Pt has been scheduled tele preop appt 06/01/24. Med rec and consent are done.

## 2024-06-01 ENCOUNTER — Ambulatory Visit: Attending: Cardiology | Admitting: Emergency Medicine

## 2024-06-01 DIAGNOSIS — Z01810 Encounter for preprocedural cardiovascular examination: Secondary | ICD-10-CM

## 2024-06-01 NOTE — Progress Notes (Signed)
 Virtual Visit via Telephone Note   Because of SEBASTYAN SNODGRASS co-morbid illnesses, he is at least at moderate risk for complications without adequate follow up.  This format is felt to be most appropriate for this patient at this time.  Due to technical limitations with video connection (technology), today's appointment will be conducted as an audio only telehealth visit, and Craig Mccann verbally agreed to proceed in this manner.   All issues noted in this document were discussed and addressed.  No physical exam could be performed with this format.  Evaluation Performed:  Preoperative cardiovascular risk assessment _____________   Date:  06/01/2024   Patient ID:  Craig Mccann, DOB 1951/03/06, MRN [id number] Patient Location:  Home Provider location:   Office  Primary Care Provider:  Suanne Pfeiffer, NP Primary Cardiologist:  Alvan Carrier, MD  Chief Complaint / Patient Profile   73 y.o. y/o male with a h/o coronary artery disease, chronic systolic heart failure, hyperlipidemia, aortic aneurysm who is pending right ankle arthroplasty, open lateral ankle on 06/08/2024 with [facility] and presents today for telephonic preoperative cardiovascular risk assessment.  History of Present Illness    Craig Mccann is a 73 y.o. male who presents via audio/video conferencing for a telehealth visit today.  Pt was last seen in cardiology clinic on 01/22/2024 by Dr. Alvan.  At that time Craig Mccann was doing well.  The patient is now pending procedure as outlined above.   Since his last visit, he denies chest pain, shortness of breath, lower extremity edema, fatigue, palpitations, melena, hematuria, hemoptysis, diaphoresis, weakness, presyncope, syncope, orthopnea, and PND.  Today patient is doing well without acute cardiovascular concerns.  He remains active and tells me he is constantly moving or working on a project.  Overall he is without any exertional chest pains or  dyspnea.  No symptoms to suggest active angina.  He is easily able to complete greater than 4 METS.  Past Medical History    Past Medical History:  Diagnosis Date   Acute prostatitis 01/2015   Allergy    Arthritis    Basal cell carcinoma 2017   Multiple: back, left shin, left arm   CAD (coronary artery disease)    a. 07/2017: cath showing 50-60% stenosis along the LCx with a FFR of 0.94 not being hemodynamically significant   CAP (community acquired pneumonia) 06/26/2015   Chronic renal insufficiency, stage II (mild)    stage II/II: CrCl 60s.   Complication of anesthesia    COPD (chronic obstructive pulmonary disease) (HCC) 05/07/2015   Long time smoker + COPD changes noted on lung ca screening CT   Diverticulitis    Encounter for screening for lung cancer    Screening CT ok 08/27/16--repeat in 1 yr recommended.   Family history of colon cancer in mother    Hearing impairment    hearing aids   History of adenomatous polyp of colon 12/24/2010   History of kidney stones    Hyperlipidemia 07/14/2016   started atorv 20 07/16/16   Kidney stones    bilat nonobstrucing renal calculi 2016, + left benign renal cysts   Melanoma (HCC)    R posterolateral neck and R nasal ala.  Most recent was left mid back 01/2016 (Dr. Eda).   Spinal headache    Tobacco dependence    CT chest for lung ca screning 04/2015 showed benign findings; 1 yr repeat recommended   Past Surgical History:  Procedure Laterality Date   CERVICAL  FUSION     CHOLECYSTECTOMY  1987   COLONOSCOPY  12/24/10; 2014   Multiple TCSs: pt on 5 yr recall, next due 2019.   COLONOSCOPY N/A 10/09/2017   Procedure: COLONOSCOPY;  Surgeon: Harvey Margo CROME, MD;  [facility];  Service: Endoscopy;  Laterality: N/A;  1:30   CORONARY PRESSURE/FFR STUDY N/A 07/14/2017   Procedure: INTRAVASCULAR PRESSURE WIRE/FFR STUDY;  Surgeon: Claudene Victory ORN, MD;  [facility];  Service: Cardiovascular;  Laterality: N/A;    ESOPHAGEAL DILATION N/A 01/29/2024   Procedure: DILATION, ESOPHAGUS;  Surgeon: Cindie Carlin POUR, DO;  [facility];  Service: Endoscopy;  Laterality: N/A;   ESOPHAGOGASTRODUODENOSCOPY N/A 01/29/2024   Procedure: EGD (ESOPHAGOGASTRODUODENOSCOPY);  Surgeon: Cindie Carlin POUR, DO;  [facility];  Service: Endoscopy;  Laterality: N/A;  7:30 am, ok rm 1/2   INGUINAL HERNIA REPAIR  1984   JOINT REPLACEMENT     left   KNEE SURGERY Left 2015   cartilage repair   KNEE SURGERY Right 1997                LEFT HEART CATH AND CORONARY ANGIOGRAPHY N/A 07/14/2017   Procedure: LEFT HEART CATH AND CORONARY ANGIOGRAPHY;  Surgeon: Claudene Victory ORN, MD;  [facility];  Service: Cardiovascular;  Laterality: N/A;   LEFT HEART CATH AND CORONARY ANGIOGRAPHY N/A 10/25/2020   Procedure: LEFT HEART CATH AND CORONARY ANGIOGRAPHY;  Surgeon: Burnard Debby LABOR, MD;  [facility];  Service: Cardiovascular;  Laterality: N/A;   LEFT HEART CATH AND CORONARY ANGIOGRAPHY N/A 12/12/2021   Procedure: LEFT HEART CATH AND CORONARY ANGIOGRAPHY;  Surgeon: Jordan, Peter M, MD;  Location: [facility] INVASIVE CV LAB;  Service: Cardiovascular;  Laterality: N/A;   LITHOTRIPSY     NECK SURGERY  1989   Thumb surgery Bilateral 2005   and 2006   TOTAL HIP ARTHROPLASTY Left 11/20/2020   Procedure: TOTAL HIP ARTHROPLASTY ANTERIOR APPROACH;  Surgeon: Beverley Evalene BIRCH, MD;  Location: [facility];  Service: Orthopedics;  Laterality: Left;   TOTAL KNEE ARTHROPLASTY Left 04/23/2016   Procedure: TOTAL KNEE ARTHROPLASTY;  Surgeon: Toribio JULIANNA Beverley, MD;  [facility];  Service: Orthopedics;  Laterality: Left;   TOTAL KNEE ARTHROPLASTY Right 05/27/2022   Procedure: TOTAL KNEE ARTHROPLASTY;  Surgeon: Beverley Evalene BIRCH, MD;  [facility]: [facility];  Service: Orthopedics;  Laterality: Right;   XI ROBOTIC ASSISTED INGUINAL HERNIA REPAIR WITH MESH Right 03/04/2024   Procedure: REPAIR, HERNIA, INGUINAL, ROBOT-ASSISTED,  LAPAROSCOPIC, USING MESH;  Surgeon: Mavis Anes, MD;  Location: [facility];  Service: General;  Laterality: Right;  XI ROBTOIC ASSISTED W/ MESH    Allergies  Allergies  Allergen Reactions   Codeine Hives   Lexiscan  [Regadenoson ] Other (See Comments)    Transient Heart Block   Valium [Diazepam] Anaphylaxis   Farxiga  [Dapagliflozin ] Other (See Comments)    Made pt feel down.    Quinolones     Patient with ATAA (ascending thoracic aortic aneurysm)    Home Medications    Prior to Admission medications   Medication Sig Start Date End Date Taking? Authorizing Provider  aspirin  EC 81 MG tablet Take 1 tablet (81 mg total) by mouth 2 (two) times daily. For DVT prophylaxis for 30 days after surgery. Patient taking differently: Take 81 mg by mouth daily. 05/27/22   Gawne, Meghan M, PA-C  carvedilol  (COREG ) 3.125 MG tablet Take 1 tablet (3.125 mg total) by mouth 2 (two) times  daily. 01/22/24   Alvan Dorn FALCON, MD  nitroGLYCERIN  (NITROSTAT ) 0.4 MG SL tablet DISSOLVE 1 TABLET UNDER THE TONGUE EVERY 5 MINUTES AS NEEDED FOR CHEST PAIN. DO NOT EXCEED A TOTAL OF 3 DOSES IN 15 MINUTES. 08/03/23   Alvan Dorn FALCON, MD    Physical Exam    Vital Signs:  Craig Mccann does not have vital signs available for review today.  Given telephonic nature of communication, physical exam is limited. AAOx3. NAD. Normal affect.  Speech and respirations are unlabored.  Accessory Clinical Findings    None  Assessment & Plan    1.  Preoperative Cardiovascular Risk Assessment: According to the Revised Cardiac Risk Index (RCRI), his Perioperative Risk of Major Cardiac Event is (%): 6.6  His Functional Capacity in METs is: 5.62 according to the Duke Activity Status Index (DASI).  Therefore, based on ACC/AHA guidelines, patient would be at acceptable risk for the planned procedure without further cardiovascular testing. I will route this recommendation to the requesting party via Epic fax function.  The  patient was advised that if he develops new symptoms prior to surgery to contact our office to arrange for a follow-up visit, and he verbalized understanding.  He may hold aspirin  for 5-7 days prior to procedure. Please resume aspirin  as soon as possible postprocedure, at the discretion of the surgeon.      A copy of this note will be routed to requesting surgeon.  Time:   Today, I have spent 10 minutes with the patient with telehealth technology discussing medical history, symptoms, and management plan.     Lum LITTIE Louis, NP  06/01/2024, 9:49 AM

## 2024-06-02 ENCOUNTER — Encounter (HOSPITAL_BASED_OUTPATIENT_CLINIC_OR_DEPARTMENT_OTHER): Payer: Self-pay | Admitting: Orthopaedic Surgery

## 2024-06-03 NOTE — H&P (Signed)
 ORTHOPAEDIC SURGERY H&P  Subjective:  The patient presents for right ankle arthroscopy, open lateral ankle ligament reconstruction, possible peroneal tendon debridement with possible repair and/or tendon transfer.   Past Medical History:  Diagnosis Date   Acute prostatitis 01/2015   Allergy    Arthritis    Basal cell carcinoma 2017   Multiple: back, left shin, left arm   CAD (coronary artery disease)    a. 07/2017: cath showing 50-60% stenosis along the LCx with a FFR of 0.94 not being hemodynamically significant   CAP (community acquired pneumonia) 06/26/2015   Chronic renal insufficiency, stage II (mild)    stage II/II: CrCl 60s.   Complication of anesthesia    COPD (chronic obstructive pulmonary disease) (HCC) 05/07/2015   Long time smoker + COPD changes noted on lung ca screening CT   Diverticulitis    Encounter for screening for lung cancer    Screening CT ok 08/27/16--repeat in 1 yr recommended.   Family history of colon cancer in mother    Hearing impairment    hearing aids   History of adenomatous polyp of colon 12/24/2010   History of kidney stones    Hyperlipidemia 07/14/2016   started atorv 20 07/16/16   Kidney stones    bilat nonobstrucing renal calculi 2016, + left benign renal cysts   Melanoma (HCC)    R posterolateral neck and R nasal ala.  Most recent was left mid back 01/2016 (Dr. Eda).   Spinal headache    Tobacco dependence    CT chest for lung ca screning 04/2015 showed benign findings; 1 yr repeat recommended    Past Surgical History:  Procedure Laterality Date   CERVICAL FUSION     CHOLECYSTECTOMY  1987   COLONOSCOPY  12/24/10; 2014   Multiple TCSs: pt on 5 yr recall, next due 2019.   COLONOSCOPY N/A 10/09/2017   Procedure: COLONOSCOPY;  Surgeon: Harvey Margo CROME, MD;  [facility];  Service: Endoscopy;  Laterality: N/A;  1:30   CORONARY PRESSURE/FFR STUDY N/A 07/14/2017   Procedure: INTRAVASCULAR PRESSURE WIRE/FFR STUDY;  Surgeon:  Claudene Victory ORN, MD;  [facility];  Service: Cardiovascular;  Laterality: N/A;   ESOPHAGEAL DILATION N/A 01/29/2024   Procedure: DILATION, ESOPHAGUS;  Surgeon: Cindie Carlin POUR, DO;  [facility];  Service: Endoscopy;  Laterality: N/A;   ESOPHAGOGASTRODUODENOSCOPY N/A 01/29/2024   Procedure: EGD (ESOPHAGOGASTRODUODENOSCOPY);  Surgeon: Cindie Carlin POUR, DO;  [facility];  Service: Endoscopy;  Laterality: N/A;  7:30 am, ok rm 1/2   INGUINAL HERNIA REPAIR  1984   JOINT REPLACEMENT     left   KNEE SURGERY Left 2015   cartilage repair   KNEE SURGERY Right 1997                LEFT HEART CATH AND CORONARY ANGIOGRAPHY N/A 07/14/2017   Procedure: LEFT HEART CATH AND CORONARY ANGIOGRAPHY;  Surgeon: Claudene Victory ORN, MD;  [facility];  Service: Cardiovascular;  Laterality: N/A;   LEFT HEART CATH AND CORONARY ANGIOGRAPHY N/A 10/25/2020   Procedure: LEFT HEART CATH AND CORONARY ANGIOGRAPHY;  Surgeon: Burnard Debby LABOR, MD;  [facility];  Service: Cardiovascular;  Laterality: N/A;   LEFT HEART CATH AND CORONARY ANGIOGRAPHY N/A 12/12/2021   Procedure: LEFT HEART CATH AND CORONARY ANGIOGRAPHY;  Surgeon: Jordan, Peter M, MD;  Location: [facility] INVASIVE CV LAB;  Service: Cardiovascular;  Laterality: N/A;   LITHOTRIPSY  NECK SURGERY  1989   Thumb surgery Bilateral 2005   and 2006   TOTAL HIP ARTHROPLASTY Left 11/20/2020   Procedure: TOTAL HIP ARTHROPLASTY ANTERIOR APPROACH;  Surgeon: Beverley Evalene BIRCH, MD;  [facility];  Service: Orthopedics;  Laterality: Left;   TOTAL KNEE ARTHROPLASTY Left 04/23/2016   Procedure: TOTAL KNEE ARTHROPLASTY;  Surgeon: Toribio JULIANNA Beverley, MD;  Location: [facility] OR;  Service: Orthopedics;  Laterality: Left;   TOTAL KNEE ARTHROPLASTY Right 05/27/2022   Procedure: TOTAL KNEE ARTHROPLASTY;  Surgeon: Beverley Evalene BIRCH, MD;  [facility];  Service: Orthopedics;  Laterality: Right;   XI ROBOTIC ASSISTED INGUINAL HERNIA  REPAIR WITH MESH Right 03/04/2024   Procedure: REPAIR, HERNIA, INGUINAL, ROBOT-ASSISTED, LAPAROSCOPIC, USING MESH;  Surgeon: Mavis Anes, MD;  Location: [facility];  Service: General;  Laterality: Right;  XI ROBTOIC ASSISTED W/ MESH     (Not in an outpatient encounter)    Allergies  Allergen Reactions   Codeine Hives   Lexiscan  [Regadenoson ] Other (See Comments)    Transient Heart Block   Valium [Diazepam] Anaphylaxis   Farxiga  [Dapagliflozin ] Other (See Comments)    Made pt feel down.    Quinolones     Patient with ATAA (ascending thoracic aortic aneurysm)    Social History   Socioeconomic History   Marital status: Married    Spouse name: Not on file   Number of children: Not on file   Years of education: Not on file   Highest education level: Not on file  Occupational History   Not on file  Tobacco Use   Smoking status: Some Days    Current packs/day: 1.00    Average packs/day: 1 pack/day for 55.8 years (55.8 ttl pk-yrs)    Types: Cigarettes    Start date: 08/04/1968   Smokeless tobacco: Never   Tobacco comments:    1-2 a week   Vaping Use   Vaping status: Never Used  Substance and Sexual Activity   Alcohol use: No    Alcohol/week: 0.0 standard drinks of alcohol   Drug use: No   Sexual activity: Yes  Other Topics Concern   Not on file  Social History Narrative   Married, 3 daughters.   Educ: HS + 2 yr technical school.   Occupation: mechanic--RETIRED.  Orig from [place] .   Relocated to [facility] 2016.   Tob: 50 pack-yr hx; cutting back as of 01/2015.   No alc or drugs.   Social Drivers of Corporate Investment Banker Strain: Low Risk  (04/18/2024)   Received from [facility]   Overall Financial Resource Strain (CARDIA)    How hard is it for you to pay for the very basics like food, housing, medical care, and heating?: Not hard at all  Food Insecurity: No Food Insecurity (04/18/2024)   Received from [facility]   Hunger Vital Sign    Within the past 12 months,  you worried that your food would run out before you got the money to buy more.: Never true    Within the past 12 months, the food you bought just didn't last and you didn't have money to get more.: Never true  Transportation Needs: No Transportation Needs (04/18/2024)   Received from [facility] - Transportation    In the past 12 months, has lack of transportation kept you from medical appointments or from getting medications?: No    In the past 12 months, has lack of transportation kept you from meetings, work,  or from getting things needed for daily living?: No  Physical Activity: Insufficiently Active (04/18/2024)   Received from [facility]   Exercise Vital Sign    On average, how many days per week do you engage in moderate to strenuous exercise (like a brisk walk)?: 3 days    On average, how many minutes do you engage in exercise at this level?: 30 min  Stress: No Stress Concern Present (04/18/2024)   Received from [facility] - Occupational Stress Questionnaire    Do you feel stress - tense, restless, nervous, or anxious, or unable to sleep at night because your mind is troubled all the time - these days?: Only a little  Social Connections: Moderately Integrated (04/18/2024)   Received from [facility]   Social Network    How would you rate your social network (family, work, friends)?: Adequate participation with social networks  Intimate Partner Violence: Not At Risk (04/18/2024)   Received from [facility]   HITS    Over the last 12 months how often did your partner physically hurt you?: Never    Over the last 12 months how often did your partner insult you or talk down to you?: Never    Over the last 12 months how often did your partner threaten you with physical harm?: Never    Over the last 12 months how often did your partner scream or curse at you?: Never     History reviewed. No pertinent family history.   Review  of Systems Pertinent items are noted in HPI.  Objective: Vital signs in last 24 hours:    06/02/2024   10:18 AM 03/15/2024    1:25 PM 03/04/2024   12:31 PM  Vitals with BMI  Height 6' 1 6' 1   Weight 197 lbs 9 oz 197 lbs 10 oz   BMI 26.07 26.08   Systolic  125 120  Diastolic  84 80  Pulse  71 65      EXAM: General: Well nourished, well developed. Awake, alert and oriented to time, place, person. Normal mood and affect. No apparent distress. Breathing room air.  Operative Lower Extremity: Alignment - Neutral Deformity - None Skin intact Tenderness to palpation - right ankle LLC, peroneals 5/5 TA, PT, GS, Per, EHL, FHL Sensation intact to light touch throughout Palpable DP and PT pulses Special testing: None  The contralateral foot/ankle was examined for comparison and noted to be neurovascularly intact with no localized deformity, swelling, or tenderness.  Imaging Review All images taken were independently reviewed by me.  Assessment/Plan: The clinical and radiographic findings were reviewed and discussed at length with the patient.  The patient presents for right ankle arthroscopy, open lateral ankle ligament reconstruction, possible peroneal tendon debridement with possible repair and/or tendon transfer.  We spoke at length about the natural course of these findings. We discussed nonoperative and operative treatment options in detail.  The risks and benefits were presented and reviewed. The risks due to suture/hardware failure/irritation (or if removing hardware inability to remove part/all of hardware, recurrent instability), new/persistent/recurrent infection, stiffness, nerve/vessel/tendon injury, nonunion/malunion of any fracture, wound healing issues, allograft usage, development of arthritis, failure of this surgery, possibility of external fixation in certain situations, possibility of delayed definitive surgery, need for further surgery, prolonged wound care  including further soft tissue coverage procedures, thromboembolic events, anesthesia/medical complications/events perioperatively and beyond, amputation, death among others were discussed. The patient acknowledged the explanation and agreed to  proceed with the plan.  Lillia Mountain  Orthopaedic Surgery [facility]

## 2024-06-03 NOTE — Discharge Instructions (Addendum)
 Lillia Mountain, MD [facility]  Please read the following information regarding your care after surgery.  Medications   - Tramadol  50 mg every 4-6 hours as needed for pain - Aspirin  81 mg twice daily as scheduled to prevent blood clots - Colace 100 mg twice daily as needed for constipation - Zofran  4 mg every 8 hours as needed for nausea/vomiting  We send above prescriptions to your pharmacy on file.  In addition you may also use: ? acetaminophen  (Tylenol ) 500 mg every 4-6 hours as you need for minor to moderate pain  Resume all other routine medications per usual or as directed by your PCP/other specialists.  Weight Bearing ? Do NOT bear any weight on the operated leg or foot. This means do NOT touch your surgical leg to the ground!  Cast / Splint / Dressing ? If you have a splint, do NOT remove this. Keep your splint, cast or dressing clean and dry.  Don't put anything (coat hanger, pencil, etc) down inside of it.  If it gets wet, call the office immediately to schedule an appointment for a cast change.  Swelling IMPORTANT: It is normal for you to have swelling where you had surgery. To reduce swelling and pain, keep at least 3 pillows under your leg so that your toes are above your nose and your heel is above the level of your hip.  It may be necessary to keep your foot or leg elevated for several weeks.  This is critical to helping your incisions heal and your pain to feel better.  Follow Up Call my office at [phone] when you are discharged from the hospital or surgery center to schedule an appointment to be seen 7-10 days after surgery.  Call my office at [phone] if you develop a fever >101.5 F, nausea, vomiting, bleeding from the surgical site or severe pain.      Post Anesthesia Home Care Instructions  Activity: Get plenty of rest for the remainder of the day. A responsible individual must stay with you for 24 hours following the procedure.  For the next 24  hours, DO NOT: -Drive a car -Advertising copywriter -Drink alcoholic beverages -Take any medication unless instructed by your physician -Make any legal decisions or sign important papers.  Meals: Start with liquid foods such as gelatin or soup. Progress to regular foods as tolerated. Avoid greasy, spicy, heavy foods. If nausea and/or vomiting occur, drink only clear liquids until the nausea and/or vomiting subsides. Call your physician if vomiting continues.  Special Instructions/Symptoms: Your throat may feel dry or sore from the anesthesia or the breathing tube placed in your throat during surgery. If this causes discomfort, gargle with warm salt water . The discomfort should disappear within 24 hours.  Next dose of tylenol  will be at [facility] Anesthesia Blocks  1. You may not be able to move or feel the blocked extremity after a regional anesthetic block. This may last may last from 3-48 hours after placement, but it will go away. The length of time depends on the medication injected and your individual response to the medication. As the nerves start to wake up, you may experience tingling as the movement and feeling returns to your extremity. If the numbness and inability to move your extremity has not gone away after 48 hours, please call your surgeon.   2. The extremity that is blocked will need to be protected until the numbness is gone and the strength has returned. Because you cannot  feel it, you will need to take extra care to avoid injury. Because it may be weak, you may have difficulty moving it or using it. You may not know what position it is in without looking at it while the block is in effect.  3. For blocks in the legs and feet, returning to weight bearing and walking needs to be done carefully. You will need to wait until the numbness is entirely gone and the strength has returned. You should be able to move your leg and foot normally before you try and bear weight or walk.  You will need someone to be with you when you first try to ensure you do not fall and possibly risk injury.  4. Bruising and tenderness at the needle site are common side effects and will resolve in a few days.  5. Persistent numbness or new problems with movement should be communicated to the surgeon or the [facility] [phone] [facility] [phone]).

## 2024-06-08 ENCOUNTER — Encounter (HOSPITAL_BASED_OUTPATIENT_CLINIC_OR_DEPARTMENT_OTHER): Payer: Self-pay | Admitting: Orthopaedic Surgery

## 2024-06-08 ENCOUNTER — Other Ambulatory Visit: Payer: Self-pay

## 2024-06-08 ENCOUNTER — Ambulatory Visit (HOSPITAL_BASED_OUTPATIENT_CLINIC_OR_DEPARTMENT_OTHER): Admitting: Anesthesiology

## 2024-06-08 ENCOUNTER — Encounter (HOSPITAL_BASED_OUTPATIENT_CLINIC_OR_DEPARTMENT_OTHER)
Admission: RE | Disposition: A | Discharge status: Home / Self Care | Payer: Self-pay | Source: Home / Self Care | Attending: Orthopaedic Surgery

## 2024-06-08 ENCOUNTER — Ambulatory Visit (HOSPITAL_BASED_OUTPATIENT_CLINIC_OR_DEPARTMENT_OTHER)
Admission: RE | Admit: 2024-06-08 | Discharge: 2024-06-08 | Disposition: A | Discharge status: Home / Self Care | Attending: Orthopaedic Surgery | Admitting: Orthopaedic Surgery

## 2024-06-08 DIAGNOSIS — N182 Chronic kidney disease, stage 2 (mild): Secondary | ICD-10-CM | POA: Diagnosis not present

## 2024-06-08 DIAGNOSIS — F17210 Nicotine dependence, cigarettes, uncomplicated: Secondary | ICD-10-CM | POA: Insufficient documentation

## 2024-06-08 DIAGNOSIS — J449 Chronic obstructive pulmonary disease, unspecified: Secondary | ICD-10-CM | POA: Insufficient documentation

## 2024-06-08 DIAGNOSIS — I2510 Atherosclerotic heart disease of native coronary artery without angina pectoris: Secondary | ICD-10-CM | POA: Insufficient documentation

## 2024-06-08 DIAGNOSIS — I5022 Chronic systolic (congestive) heart failure: Secondary | ICD-10-CM

## 2024-06-08 DIAGNOSIS — M7671 Peroneal tendinitis, right leg: Secondary | ICD-10-CM

## 2024-06-08 DIAGNOSIS — G8918 Other acute postprocedural pain: Secondary | ICD-10-CM | POA: Diagnosis not present

## 2024-06-08 DIAGNOSIS — M1990 Unspecified osteoarthritis, unspecified site: Secondary | ICD-10-CM | POA: Diagnosis not present

## 2024-06-08 DIAGNOSIS — M94271 Chondromalacia, right ankle and joints of right foot: Secondary | ICD-10-CM | POA: Diagnosis not present

## 2024-06-08 DIAGNOSIS — M65871 Other synovitis and tenosynovitis, right ankle and foot: Secondary | ICD-10-CM | POA: Diagnosis not present

## 2024-06-08 DIAGNOSIS — M25371 Other instability, right ankle: Secondary | ICD-10-CM | POA: Diagnosis not present

## 2024-06-08 DIAGNOSIS — I25119 Atherosclerotic heart disease of native coronary artery with unspecified angina pectoris: Secondary | ICD-10-CM

## 2024-06-08 HISTORY — PX: ARTHROSCOPY, ANKLE WITH DEBRIDEMENT: SHX7318

## 2024-06-08 SURGERY — ARTHROSCOPY, ANKLE WITH DEBRIDEMENT
Anesthesia: General | Laterality: Right

## 2024-06-08 MED ORDER — VANCOMYCIN HCL 500 MG IV SOLR
INTRAVENOUS | Status: AC
Start: 2024-06-08 — End: 2024-06-08
  Filled 2024-06-08: qty 10

## 2024-06-08 MED ORDER — BUPIVACAINE-EPINEPHRINE (PF) 0.5% -1:200000 IJ SOLN
INTRAMUSCULAR | Status: AC
  Filled 2024-06-08: qty 30

## 2024-06-08 MED ORDER — PHENYLEPHRINE HCL (PRESSORS) 10 MG/ML IV SOLN
INTRAVENOUS | Status: DC | PRN
Start: 2024-06-08 — End: 2024-06-08
  Administered 2024-06-08 (×3): 80 ug via INTRAVENOUS

## 2024-06-08 MED ORDER — DEXAMETHASONE SOD PHOSPHATE PF 10 MG/ML IJ SOLN
INTRAMUSCULAR | Status: DC | PRN
  Administered 2024-06-08: 10 mg via PERINEURAL

## 2024-06-08 MED ORDER — FENTANYL CITRATE (PF) 100 MCG/2ML IJ SOLN: 25.0000 ug | INTRAMUSCULAR | Status: DC | PRN

## 2024-06-08 MED ORDER — CHLORHEXIDINE GLUCONATE 4 % EX SOLN: 60.0000 mL | Freq: Once | CUTANEOUS | Status: DC

## 2024-06-08 MED ORDER — ONDANSETRON 4 MG PO TBDP: 4.0000 mg | ORAL_TABLET | Freq: Three times a day (TID) | ORAL | 0 refills | Status: AC | PRN

## 2024-06-08 MED ORDER — OXYCODONE HCL 5 MG PO TABS: 5.0000 mg | ORAL_TABLET | Freq: Once | ORAL | Status: DC | PRN

## 2024-06-08 MED ORDER — TRAMADOL HCL 50 MG PO TABS: 50.0000 mg | ORAL_TABLET | ORAL | 0 refills | Status: AC | PRN

## 2024-06-08 MED ORDER — LACTATED RINGERS IV SOLN: INTRAVENOUS | Status: DC

## 2024-06-08 MED ORDER — ACETAMINOPHEN 500 MG PO TABS
1000.0000 mg | ORAL_TABLET | Freq: Once | ORAL | Status: AC
  Administered 2024-06-08: 1000 mg via ORAL

## 2024-06-08 MED ORDER — DOCUSATE SODIUM 100 MG PO CAPS: 100.0000 mg | ORAL_CAPSULE | Freq: Two times a day (BID) | ORAL | 0 refills | Status: AC

## 2024-06-08 MED ORDER — FENTANYL CITRATE (PF) 100 MCG/2ML IJ SOLN
INTRAMUSCULAR | Status: DC | PRN
  Administered 2024-06-08: 50 ug via INTRAVENOUS

## 2024-06-08 MED ORDER — SODIUM CHLORIDE 0.9 % IR SOLN
Status: DC | PRN
Start: 2024-06-08 — End: 2024-06-08
  Administered 2024-06-08: 250 mL

## 2024-06-08 MED ORDER — FENTANYL CITRATE (PF) 100 MCG/2ML IJ SOLN
INTRAMUSCULAR | Status: AC
  Filled 2024-06-08: qty 2

## 2024-06-08 MED ORDER — PROPOFOL 500 MG/50ML IV EMUL
INTRAVENOUS | Status: AC
  Filled 2024-06-08: qty 50

## 2024-06-08 MED ORDER — CEFAZOLIN SODIUM-DEXTROSE 2-4 GM/100ML-% IV SOLN
INTRAVENOUS | Status: AC
  Filled 2024-06-08: qty 100

## 2024-06-08 MED ORDER — MIDAZOLAM HCL 2 MG/2ML IJ SOLN
INTRAMUSCULAR | Status: AC
Start: 2024-06-08 — End: 2024-06-08
  Filled 2024-06-08: qty 2

## 2024-06-08 MED ORDER — POVIDONE-IODINE 10 % EX SOLN
CUTANEOUS | Status: DC | PRN
Start: 2024-06-08 — End: 2024-06-08
  Administered 2024-06-08: 1 via TOPICAL

## 2024-06-08 MED ORDER — VANCOMYCIN HCL 500 MG IV SOLR
INTRAVENOUS | Status: DC | PRN
Start: 2024-06-08 — End: 2024-06-08
  Administered 2024-06-08: 500 mg

## 2024-06-08 MED ORDER — ACETAMINOPHEN 500 MG PO TABS
ORAL_TABLET | ORAL | Status: AC
  Filled 2024-06-08: qty 2

## 2024-06-08 MED ORDER — CEFAZOLIN SODIUM-DEXTROSE 2-4 GM/100ML-% IV SOLN
2.0000 g | INTRAVENOUS | Status: AC
  Administered 2024-06-08: 2 g via INTRAVENOUS

## 2024-06-08 MED ORDER — EPHEDRINE SULFATE (PRESSORS) 25 MG/5ML IV SOSY
PREFILLED_SYRINGE | INTRAVENOUS | Status: DC | PRN
Start: 2024-06-08 — End: 2024-06-08
  Administered 2024-06-08: 10 mg via INTRAVENOUS
  Administered 2024-06-08: 5 mg via INTRAVENOUS
  Administered 2024-06-08 (×2): 10 mg via INTRAVENOUS

## 2024-06-08 MED ORDER — FENTANYL CITRATE (PF) 100 MCG/2ML IJ SOLN
100.0000 ug | Freq: Once | INTRAMUSCULAR | Status: AC
  Administered 2024-06-08: 100 ug via INTRAVENOUS

## 2024-06-08 MED ORDER — ONDANSETRON HCL 4 MG/2ML IJ SOLN
INTRAMUSCULAR | Status: AC
  Filled 2024-06-08: qty 2

## 2024-06-08 MED ORDER — ONDANSETRON HCL 4 MG/2ML IJ SOLN: 4.0000 mg | Freq: Once | INTRAMUSCULAR | Status: DC | PRN

## 2024-06-08 MED ORDER — OXYCODONE HCL 5 MG/5ML PO SOLN: 5.0000 mg | Freq: Once | ORAL | Status: DC | PRN

## 2024-06-08 MED ORDER — ASPIRIN 81 MG PO TBEC: 81.0000 mg | DELAYED_RELEASE_TABLET | Freq: Two times a day (BID) | ORAL | 0 refills | Status: AC

## 2024-06-08 MED ORDER — ONDANSETRON HCL 4 MG/2ML IJ SOLN
INTRAMUSCULAR | Status: DC | PRN
Start: 2024-06-08 — End: 2024-06-08
  Administered 2024-06-08: 4 mg via INTRAVENOUS

## 2024-06-08 MED ORDER — DEXAMETHASONE SOD PHOSPHATE PF 10 MG/ML IJ SOLN
INTRAMUSCULAR | Status: DC | PRN
  Administered 2024-06-08: 4 mg via INTRAVENOUS

## 2024-06-08 MED ORDER — PROPOFOL 10 MG/ML IV BOLUS
INTRAVENOUS | Status: DC | PRN
  Administered 2024-06-08: 120 mg via INTRAVENOUS

## 2024-06-08 MED ORDER — ROPIVACAINE HCL 5 MG/ML IJ SOLN
INTRAMUSCULAR | Status: DC | PRN
  Administered 2024-06-08: 30 mL via PERINEURAL

## 2024-06-08 MED ORDER — ACETAMINOPHEN 10 MG/ML IV SOLN: 1000.0000 mg | Freq: Once | INTRAVENOUS | Status: DC | PRN

## 2024-06-08 SURGICAL SUPPLY — 54 items
ANCHOR SUT FBRTK 1.3 SUTTAP (Anchor) IMPLANT
BLADE SURG 15 STRL LF DISP TIS (BLADE) ×2 IMPLANT
BNDG COMPR ESMARK 6X3 LF (GAUZE/BANDAGES/DRESSINGS) IMPLANT
BNDG ELASTIC 4INX 5YD STR LF (GAUZE/BANDAGES/DRESSINGS) ×2 IMPLANT
BNDG ELASTIC 6X10 VLCR STRL LF (GAUZE/BANDAGES/DRESSINGS) ×2 IMPLANT
BNDG STRETCH GAUZE 3IN X12FT (GAUZE/BANDAGES/DRESSINGS) ×2 IMPLANT
BOOT STEPPER DURA LG (SOFTGOODS) IMPLANT
BOOT STEPPER DURA MED (SOFTGOODS) IMPLANT
BOOT STEPPER DURA SM (SOFTGOODS) IMPLANT
BURR OVAL 8 FLU 4.0X13 (MISCELLANEOUS) IMPLANT
CHLORAPREP W/TINT 26 (MISCELLANEOUS) IMPLANT
CUFF TRNQT CYL 34X4.125X (TOURNIQUET CUFF) IMPLANT
DISSECTOR 3.8MM X 13CM (MISCELLANEOUS) IMPLANT
DRAPE EXTREMITY T 121X128X90 (DISPOSABLE) ×2 IMPLANT
DRAPE OEC MINIVIEW 54X84 (DRAPES) IMPLANT
DRAPE U-SHAPE 47X51 STRL (DRAPES) ×2 IMPLANT
DRSG MEPITEL 4X7.2 (GAUZE/BANDAGES/DRESSINGS) ×2 IMPLANT
ELECTRODE REM PT RTRN 9FT ADLT (ELECTROSURGICAL) ×2 IMPLANT
EXCALIBUR 3.8MM X 13CM (MISCELLANEOUS) IMPLANT
GAUZE PAD ABD 8X10 STRL (GAUZE/BANDAGES/DRESSINGS) IMPLANT
GAUZE SPONGE 4X4 12PLY STRL (GAUZE/BANDAGES/DRESSINGS) ×2 IMPLANT
GLOVE BIOGEL PI IND STRL 8 (GLOVE) ×2 IMPLANT
GLOVE SURG SS PI 7.5 STRL IVOR (GLOVE) ×2 IMPLANT
GOWN STRL REUS W/ TWL LRG LVL3 (GOWN DISPOSABLE) ×2 IMPLANT
KIT FIBERTAK DX 1.6 DISP (KITS) IMPLANT
NDL SUT 6 .5 CRC .975X.05 MAYO (NEEDLE) IMPLANT
PACK ARTHROSCOPY DSU (CUSTOM PROCEDURE TRAY) ×2 IMPLANT
PACK BASIN DAY SURGERY FS (CUSTOM PROCEDURE TRAY) ×2 IMPLANT
PENCIL SMOKE EVACUATOR (MISCELLANEOUS) IMPLANT
SANITIZER HAND ALTRA PUMP 550 (MISCELLANEOUS) ×2 IMPLANT
SCOPE NANONDL 125 (MISCELLANEOUS) IMPLANT
SCOPE NANONEEDLE 125 (MISCELLANEOUS) ×1 IMPLANT
SET IRRIG Y TYPE TUR BLADDER L (SET/KITS/TRAYS/PACK) ×2 IMPLANT
SHAVER DISSECTOR 3.0 (BURR) IMPLANT
SHAVER SABRE 2.0 (BURR) IMPLANT
SHEATH NANONDL HIGHFLOW 125 (SHEATH) IMPLANT
SHEATH NANONEEDLE HIGHFLOW 125 (SHEATH) ×1 IMPLANT
SHEET MEDIUM DRAPE 40X70 STRL (DRAPES) IMPLANT
SLEEVE SCD COMPRESS KNEE MED (STOCKING) ×2 IMPLANT
SOL .9 NS 3000ML IRR UROMATIC (IV SOLUTION) ×2 IMPLANT
SOLN 0.9% NACL POUR BTL 1000ML (IV SOLUTION) IMPLANT
SPLINT FIBERGLASS 4X30 (CAST SUPPLIES) IMPLANT
SPLINT PLASTER CAST FAST 5X30 (CAST SUPPLIES) IMPLANT
SPONGE T-LAP 18X18 ~~LOC~~+RFID (SPONGE) ×2 IMPLANT
STRAP ANKLE FOOT DISTRACTOR (ORTHOPEDIC SUPPLIES) IMPLANT
SUCTION TUBE FRAZIER 10FR DISP (SUCTIONS) ×2 IMPLANT
SUT ETHILON 2 0 FS 18 (SUTURE) ×2 IMPLANT
SUT VIC AB 2-0 CT1 TAPERPNT 27 (SUTURE) IMPLANT
SUT VIC AB 3-0 SH 27X BRD (SUTURE) IMPLANT
SYR BULB EAR ULCER 3OZ GRN STR (SYRINGE) ×2 IMPLANT
TOWEL GREEN STERILE FF (TOWEL DISPOSABLE) ×2 IMPLANT
TUBE CONNECTING 20X1/4 (TUBING) IMPLANT
TUBING ARTHROSCOPY IRRIG 16FT (MISCELLANEOUS) ×2 IMPLANT
WAND ABLATOR APOLLO I90 (BUR) IMPLANT

## 2024-06-08 NOTE — Anesthesia Preprocedure Evaluation (Signed)
 Anesthesia Evaluation  Patient identified by MRN, date of birth, ID band Patient awake    Reviewed: Allergy & Precautions, NPO status , Patient's Chart, lab work & pertinent test results, reviewed documented beta blocker date and time   History of Anesthesia Complications (+) POST - OP SPINAL HEADACHE and history of anesthetic complications  Airway Mallampati: II  TM Distance: >3 FB     Dental  (+) Upper Dentures   Pulmonary pneumonia, COPD,  COPD inhaler, Current Smoker   breath sounds clear to auscultation       Cardiovascular (-) hypertension(-) angina + CAD   Rhythm:Regular Rate:Normal     Neuro/Psych  Headaches, neg Seizures PSYCHIATRIC DISORDERS         GI/Hepatic ,neg GERD  ,,(+) neg Cirrhosis        Endo/Other    Renal/GU CRFRenal disease     Musculoskeletal  (+) Arthritis , Osteoarthritis,    Abdominal   Peds  Hematology   Anesthesia Other Findings   Reproductive/Obstetrics                              Anesthesia Physical Anesthesia Plan  ASA: 3  Anesthesia Plan: General   Post-op Pain Management: Regional block*   Induction: Intravenous  PONV Risk Score and Plan: 2 and Ondansetron  and Dexamethasone   Airway Management Planned: Oral ETT  Additional Equipment:   Intra-op Plan:   Post-operative Plan: Extubation in OR  Informed Consent: I have reviewed the patients History and Physical, chart, labs and discussed the procedure including the risks, benefits and alternatives for the proposed anesthesia with the patient or authorized representative who has indicated his/her understanding and acceptance.     Dental advisory given  Plan Discussed with: CRNA  Anesthesia Plan Comments:         Anesthesia Quick Evaluation

## 2024-06-08 NOTE — Transfer of Care (Signed)
 Immediate Anesthesia Transfer of Care Note  Patient: Craig Mccann  Procedure(s) Performed: ARTHROSCOPY, ANKLE WITH DEBRIDEMENT (Right)  Patient Location: PACU  Anesthesia Type:GA combined with regional for post-op pain  Level of Consciousness: sedated  Airway & Oxygen Therapy: Patient Spontanous Breathing and Patient connected to nasal cannula oxygen  Post-op Assessment: Report given to RN and Post -op Vital signs reviewed and stable  Post vital signs: Reviewed and stable  Last Vitals:  Vitals Value Taken Time  BP 113/72 06/08/24 09:55  Temp    Pulse 64 06/08/24 09:58  Resp 17 06/08/24 09:58  SpO2 97 % 06/08/24 09:58  Vitals shown include unfiled device data.  Last Pain:  Vitals:   06/08/24 0708  TempSrc: Oral  PainSc: 0-No pain      Patients Stated Pain Goal: 4 (06/08/24 0708)  Complications: No notable events documented.

## 2024-06-08 NOTE — Anesthesia Postprocedure Evaluation (Signed)
 Anesthesia Post Note  Patient: Craig Mccann  Procedure(s) Performed: ARTHROSCOPY, ANKLE WITH DEBRIDEMENT (Right)     Patient location during evaluation: PACU Anesthesia Type: General Level of consciousness: awake and alert Pain management: pain level controlled Vital Signs Assessment: post-procedure vital signs reviewed and stable Respiratory status: spontaneous breathing, nonlabored ventilation, respiratory function stable and patient connected to nasal cannula oxygen Cardiovascular status: blood pressure returned to baseline and stable Postop Assessment: no apparent nausea or vomiting Anesthetic complications: no   No notable events documented.  Last Vitals:  Vitals:   06/08/24 1016 06/08/24 1045  BP: 127/80 (!) 132/92  Pulse: 65 68  Resp: 18 18  Temp:  36.5 C  SpO2: 98% 94%    Last Pain:  Vitals:   06/08/24 1028  TempSrc:   PainSc: 0-No pain                 Lynwood MARLA Cornea

## 2024-06-08 NOTE — Anesthesia Procedure Notes (Signed)
 Anesthesia Regional Block: Popliteal block   Pre-Anesthetic Checklist: , timeout performed,  Correct Patient, Correct Site, Correct Laterality,  Correct Procedure, Correct Position, site marked,  Risks and benefits discussed,  Surgical consent,  Pre-op evaluation,  At surgeon's request and post-op pain management  Laterality: Right  Prep: Maximum Sterile Barrier Precautions used, chloraprep       Needles:  Injection technique: Single-shot  Needle Type: Echogenic Needle      Needle Gauge: 20     Additional Needles:   Procedures:,,,, ultrasound used (permanent image in chart),,    Narrative:  Start time: 06/08/2024 8:00 AM End time: 06/08/2024 8:05 AM Injection made incrementally with aspirations every 5 mL.  Performed by: Personally  Anesthesiologist: Keneth Lynwood POUR, MD

## 2024-06-08 NOTE — Op Note (Signed)
 06/08/2024  8:05 PM   PATIENT: Craig Mccann  73 y.o. 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   INDICATION FOR PROCEDURE: The patient presented with above diagnosis.  We discussed the diagnosis, alternative treatment options, risks and benefits of the above surgical intervention, as well as alternative non-operative treatments. All questions/concerns were addressed and the patient/family demonstrated appropriate understanding of the diagnosis, the procedure, the postoperative course, and overall prognosis. The patient wished to proceed with surgical intervention and signed an informed surgical consent as such, in each others presence prior to surgery.   PROCEDURE IN DETAIL: After preoperative consent was obtained and the correct operative site was identified, the patient was brought to the operating room supine on stretcher and transferred onto operating table. General anesthesia was induced. Preoperative antibiotics were administered. Surgical timeout was taken. The patient was then positioned supine with an ipsilateral hip bump. The operative lower extremity was prepped and draped in standard sterile fashion with a tourniquet around the thigh. The extremity was exsanguinated and the tourniquet was  inflated to 275 mmHg.  Routine evaluation of the ankle joint demonstrated gross lateral laxity with drawer testing. Full dorsiflexion as well as plantarflexion was possible.   We began by insufflating the ankle joint thru anteromedial approach. The anteromedial portal was carefully established medial to the tibialis anterior tendon. The arthroscopic trochar with blunt was inserted and then camera placed. There was excellent visualization of the joint and routine diagnostic ankle arthroscopy was performed. Of note, there was mild synovitis throughout the joint noted. Mild chondral changes in the tibiotalar joint surfaces. No loose bodies were encountered and no anterior ankle impingement was identified on max dorsiflexion. The deltoid and syndesmosis ligaments were stressed and noted to be stable. Arthroscopic assisted debridement of the ankle joint was performed.    A standard curvilinear approach was made over the lateral ankle ligament complex and the distal lateral malleolus.    We began by windowing the approach posteriorly to access the peroneal tendons. The sheath was incised and tenosynovial fluid was readily evacuated. We then sequentially evaluated both the peroneus longus and brevis tendons with tearing noted in both that was debrided and repaired primarily. We did note extensive tenosynovitis that was debrided thoroughly. There was no instability of peroneal tendons to intraoperative stress testing. The peroneal tendon sheath was closed with vicryl.    Dissection was the carried down to the level of the lateral ankle ligament complex. We sharply incised the capsule and the complex just distal to the tip of the fibula leaving a small cuff of tissue for repair after advancement. The proximal flap was elevated carefully off the lateral malleolus. We then used a rongeur to roughen the distal lateral malleolus. Two Arthrex FiberTak anchors were implanted using standard technique in the anatomic  footprints of the ATFL and CFL ligaments.  These were verified by manual stress to be well seated within bone. The suture needles were sequentially passed through the distal flap, tied, and then brought back proximally into the proximal flap were they were then tied.The foot was held in eversion throughout to set appropriate tension and protect the repair. Intraoperative ankle testing demonstrated improved stability of the newly reconstructed lateral ankle ligament complex.   The surgical sites were thoroughly irrigated. The tourniquet was deflated and hemostasis achieved. Betadine  and vancomycin powder were applied. The deep layers were closed using 2-0 vicryl. The skin was closed without tension using 2-0 nylon suture.    The leg was cleaned with saline and sterile dressings with gauze were applied. A well padded bulky short leg splint was applied. The patient was awakened from anesthesia and transported to the recovery room in stable condition.    FOLLOW UP PLAN: -transfer to PACU, then home -strict NWB operative extremity, maximum elevation -maintain short leg splint until follow up -DVT ppx: Aspirin  81 mg twice daily while NWB -follow up as outpatient within 7-10 days for wound check with exchange of short leg splint to short leg cast -sutures out in 2-3 weeks in outpatient office   RADIOGRAPHS: None used   Lillia Mountain Orthopaedic Surgery [facility]

## 2024-06-08 NOTE — Progress Notes (Signed)
 Assisted Dr. [place] with right, popliteal, ultrasound guided block. Side rails up, monitors on throughout procedure. See vital signs in flow sheet. Tolerated Procedure well.

## 2024-06-08 NOTE — H&P (Signed)

## 2024-06-08 NOTE — Anesthesia Procedure Notes (Signed)
 Procedure Name: LMA Insertion Date/Time: 06/08/2024 8:49 AM  Performed by: Julieanne Fairy BROCKS, CRNAPre-anesthesia Checklist: Patient identified, Emergency Drugs available, Suction available and Patient being monitored Patient Re-evaluated:Patient Re-evaluated prior to induction Oxygen Delivery Method: Circle system utilized Preoxygenation: Pre-oxygenation with 100% oxygen Induction Type: IV induction Ventilation: Mask ventilation without difficulty LMA: LMA inserted LMA Size: 5.0 Number of attempts: 1 Airway Equipment and Method: Bite block Placement Confirmation: positive ETCO2 Tube secured with: Tape Dental Injury: Teeth and Oropharynx as per pre-operative assessment

## 2024-06-09 ENCOUNTER — Encounter (HOSPITAL_BASED_OUTPATIENT_CLINIC_OR_DEPARTMENT_OTHER): Payer: Self-pay | Admitting: Orthopaedic Surgery

## 2024-06-09 DIAGNOSIS — M25571 Pain in right ankle and joints of right foot: Secondary | ICD-10-CM | POA: Diagnosis not present

## 2024-06-20 DIAGNOSIS — M7671 Peroneal tendinitis, right leg: Secondary | ICD-10-CM | POA: Diagnosis not present

## 2024-06-20 DIAGNOSIS — S93491D Sprain of other ligament of right ankle, subsequent encounter: Secondary | ICD-10-CM | POA: Diagnosis not present

## 2024-06-27 DIAGNOSIS — S93491D Sprain of other ligament of right ankle, subsequent encounter: Secondary | ICD-10-CM | POA: Diagnosis not present

## 2024-07-26 ENCOUNTER — Ambulatory Visit: Attending: Cardiology | Admitting: Cardiology

## 2024-07-26 ENCOUNTER — Encounter: Payer: Self-pay | Admitting: Cardiology

## 2024-07-26 VITALS — BP 110/80 | HR 75 | Ht 73.0 in | Wt 205.0 lb

## 2024-07-26 DIAGNOSIS — I5020 Unspecified systolic (congestive) heart failure: Secondary | ICD-10-CM

## 2024-07-26 DIAGNOSIS — I2510 Atherosclerotic heart disease of native coronary artery without angina pectoris: Secondary | ICD-10-CM

## 2024-07-26 DIAGNOSIS — E782 Mixed hyperlipidemia: Secondary | ICD-10-CM

## 2024-07-26 DIAGNOSIS — Z79899 Other long term (current) drug therapy: Secondary | ICD-10-CM

## 2024-07-26 NOTE — Patient Instructions (Addendum)
 Medication Instructions:   Continue all current medications.   Labwork:  BMET - order given today Office will contact with results via phone, letter or mychart.     Testing/Procedures:  none  Follow-Up:  6 months   Any Other Special Instructions Will Be Listed Below (If Applicable).   If you need a refill on your cardiac medications before your next appointment, please call your pharmacy.

## 2024-07-26 NOTE — Progress Notes (Signed)
 "     Clinical Summary Craig Mccann is a 73 y.o.male seen today for follow up of the following medical problems.    1. Chest pain/CAD 06/2017 nuclear stress: moderate mid inferoseptal and mid inferior defect with peri-infarct ischemia. LVEF 46% - 06/2017 echo LVEF 45-50%, grade I diastolic dysfunction     10/2020 cath: LAD patent, D1 20%, ramus 25%, LCX 65% prox, RCA mid 15% and distal 30% 12/2021 cath: LAD patent, D1 20%, ramus 25%, prox LCX 50%, RCA mi 15% and distal 30%   - headaches on imdur , stopped taking  - no recent chest pain, no SOB/DOE. Limited by recent ankle surgery - compliant with meds   2. Chronic systolic HF New diagnosis of systolic HF in 10/2020 10/2020 echo LVEF 30-35%, mod to severe asymmetric septal hypertrophy, grade I dd,  10/2020 cath: LAD patent, D1 20%, ramus 25%, LCX 65% prox, RCA mid 15% and distal 30%    - significant elevation in Cr with losartan  to 1.8 on 06/12/21 - medication stopped, at recheck Cr back to baseline 1.48  10/2021 echo: LVEF 30-35% Jan 2024 echo: LVEF 45-50%, grade I dd - no SOB/DOE, no LE edema    3. Hyperlipidemia 03/2021 TC 202 TG 117 HDL 39 LDL 142 - reports joint pains, he stopped taking atorvastatin  about 1 month and symptoms improved. - ongoing muscle aches with crestor .    - refererred to lipid clinic, does not appear was able to see   03/2023 TC 164 TG 103 HDL 50 LDL 94 - 04/2024 TC 841 TG 71 HDL 54 LDL 90  - wants to dsicuss with family possible [id number] before committing   4. Aortic aneurysm - followed by CT surgery  Jan 2025 CTA: ascending 4.2 cm, descending 4.5 cm - 02/2024 CTA: ascending 4.1 cm, descending 4.2.   5. CKD -04/2024 Cr up to 1.9    Past Medical History:  Diagnosis Date   Acute prostatitis 01/2015   Allergy    Arthritis    Basal cell carcinoma 2017   Multiple: back, left shin, left arm   CAD (coronary artery disease)    a. 07/2017: cath showing 50-60% stenosis along the LCx with a FFR of 0.94 not  being hemodynamically significant   CAP (community acquired pneumonia) 06/26/2015   Chronic renal insufficiency, stage II (mild)    stage II/II: CrCl 60s.   Complication of anesthesia    COPD (chronic obstructive pulmonary disease) (HCC) 05/07/2015   Long time smoker + COPD changes noted on lung ca screening CT   Diverticulitis    Encounter for screening for lung cancer    Screening CT ok 08/27/16--repeat in 1 yr recommended.   Family history of colon cancer in mother    Hearing impairment    hearing aids   History of adenomatous polyp of colon 12/24/2010   History of kidney stones    Hyperlipidemia 07/14/2016   started atorv 20 07/16/16   Kidney stones    bilat nonobstrucing renal calculi 2016, + left benign renal cysts   Melanoma (HCC)    R posterolateral neck and R nasal ala.  Most recent was left mid back 01/2016 (Dr. Eda).   Spinal headache    Tobacco dependence    CT chest for lung ca screning 04/2015 showed benign findings; 1 yr repeat recommended     Allergies[1]   Current Outpatient Medications  Medication Sig Dispense Refill   aspirin  EC 81 MG tablet Take 1 tablet (81 mg total) by  mouth 2 (two) times daily. For DVT prophylaxis for 30 days after surgery. (Patient taking differently: Take 81 mg by mouth daily.) 60 tablet 0   carvedilol  (COREG ) 3.125 MG tablet Take 1 tablet (3.125 mg total) by mouth 2 (two) times daily. 60 tablet 6   nitroGLYCERIN  (NITROSTAT ) 0.4 MG SL tablet DISSOLVE 1 TABLET UNDER THE TONGUE EVERY 5 MINUTES AS NEEDED FOR CHEST PAIN. DO NOT EXCEED A TOTAL OF 3 DOSES IN 15 MINUTES. 25 tablet 3   No current facility-administered medications for this visit.     Past Surgical History:  Procedure Laterality Date   ARTHROSCOPY, ANKLE WITH DEBRIDEMENT Right 06/08/2024   Procedure: ARTHROSCOPY, ANKLE WITH DEBRIDEMENT;  Surgeon: Barton Drape, MD;  Location: [facility];  Service: Orthopedics;  Laterality: Right;  right ankle arthroscopy,  open lateral ankle ligament reconstruction,  peroneal tendon debridement with repair   CERVICAL FUSION     CHOLECYSTECTOMY  1987   COLONOSCOPY  12/24/10; 2014   Multiple TCSs: pt on 5 yr recall, next due 2019.   COLONOSCOPY N/A 10/09/2017   Procedure: COLONOSCOPY;  Surgeon: Harvey Margo CROME, MD;  Location: [facility];  Service: Endoscopy;  Laterality: N/A;  1:30   CORONARY PRESSURE/FFR STUDY N/A 07/14/2017   Procedure: INTRAVASCULAR PRESSURE WIRE/FFR STUDY;  Surgeon: Claudene Victory ORN, MD;  Location: [facility];  Service: Cardiovascular;  Laterality: N/A;   ESOPHAGEAL DILATION N/A 01/29/2024   Procedure: DILATION, ESOPHAGUS;  Surgeon: Cindie Carlin POUR, DO;  Location: [facility];  Service: Endoscopy;  Laterality: N/A;   ESOPHAGOGASTRODUODENOSCOPY N/A 01/29/2024   Procedure: EGD (ESOPHAGOGASTRODUODENOSCOPY);  Surgeon: Cindie Carlin POUR, DO;  Location: [facility];  Service: Endoscopy;  Laterality: N/A;  7:30 am, ok rm 1/2   INGUINAL HERNIA REPAIR  1984   JOINT REPLACEMENT     left   KNEE SURGERY Left 2015   cartilage repair   KNEE SURGERY Right 1997                LEFT HEART CATH AND CORONARY ANGIOGRAPHY N/A 07/14/2017   Procedure: LEFT HEART CATH AND CORONARY ANGIOGRAPHY;  Surgeon: Claudene Victory ORN, MD;  Location: [facility];  Service: Cardiovascular;  Laterality: N/A;   LEFT HEART CATH AND CORONARY ANGIOGRAPHY N/A 10/25/2020   Procedure: LEFT HEART CATH AND CORONARY ANGIOGRAPHY;  Surgeon: Burnard Debby LABOR, MD;  Location: [facility];  Service: Cardiovascular;  Laterality: N/A;   LEFT HEART CATH AND CORONARY ANGIOGRAPHY N/A 12/12/2021   Procedure: LEFT HEART CATH AND CORONARY ANGIOGRAPHY;  Surgeon: Jordan, Peter M, MD;  Location: [facility];  Service: Cardiovascular;  Laterality: N/A;   LITHOTRIPSY     NECK SURGERY  1989   Thumb surgery Bilateral 2005   and 2006   TOTAL HIP ARTHROPLASTY Left 11/20/2020   Procedure: TOTAL HIP ARTHROPLASTY ANTERIOR APPROACH;   Surgeon: Beverley Evalene BIRCH, MD;  Location: [facility];  Service: Orthopedics;  Laterality: Left;   TOTAL KNEE ARTHROPLASTY Left 04/23/2016   Procedure: TOTAL KNEE ARTHROPLASTY;  Surgeon: Toribio JULIANNA Beverley, MD;  [facility]: [facility];  Service: Orthopedics;  Laterality: Left;   TOTAL KNEE ARTHROPLASTY Right 05/27/2022   Procedure: TOTAL KNEE ARTHROPLASTY;  Surgeon: Beverley Evalene BIRCH, MD;  Location: [facility];  Service: Orthopedics;  Laterality: Right;   XI ROBOTIC ASSISTED INGUINAL HERNIA REPAIR WITH MESH Right 03/04/2024   Procedure: REPAIR, HERNIA, INGUINAL, ROBOT-ASSISTED, LAPAROSCOPIC, USING MESH;  Surgeon: Mavis Anes, MD;  Location: [facility];  Service: General;  Laterality: Right;  XI ROBTOIC ASSISTED W/ MESH     Allergies[2]    Family History  Problem Relation Age of Onset   Colon cancer Mother    Cancer Mother    Lung cancer Father    Cancer Father    Brain cancer Brother    Cancer Brother      Social History [name] reports that he has been smoking cigarettes. He started smoking about 56 years ago. He has a 56 pack-year smoking history. He has never used smokeless tobacco. [name] reports no history of alcohol use.     Physical Examination Today's Vitals   07/26/24 1246  BP: 110/80  Pulse: 75  SpO2: 95%  Weight: 205 lb (93 kg)  Height: 6' 1 (1.854 m)   Body mass index is 27.05 kg/m.  Gen: resting comfortably, no acute distress HEENT: no scleral icterus, pupils equal round and reactive, no palptable cervical adenopathy,  CV: RRR, no mrg, no jvd Resp: Clear to auscultation bilaterally GI: abdomen is soft, non-tender, non-distended, normal bowel sounds, no hepatosplenomegaly MSK: extremities are warm, no edema.  Skin: warm, no rash Neuro:  no focal deficits Psych: appropriate affect   Diagnostic Studies   06/2017 nuclear stress There was no ST segment deviation noted during stress. Defect 1: There is a medium defect of moderate severity present in the mid  inferoseptal and mid inferior location. Findings consistent with prior myocardial infarction with peri-infarct ischemia. Variable soft tissue attenuation artifact is also contributing to defect. This is a low to intermediate risk study. Nuclear stress EF: 46%.   11.2018 echoStudy Conclusions   - Left ventricle: The cavity size was normal. Wall thickness was   increased increased in a pattern of mild to moderate LVH.   Systolic function was mildly reduced. The estimated ejection   fraction was in the range of 45% to 50%. Doppler parameters are   consistent with abnormal left ventricular relaxation (grade 1   diastolic dysfunction). - Aortic valve: Valve area (VTI): 1.97 cm^2. Valve area (Vmax):   2.04 cm^2. Valve area (Vmean): 2 cm^2. - Technically adequate study.   10/2020 cath Ost 1st Diag lesion is 20% stenosed. Dist RCA lesion is 30% stenosed. Ramus lesion is 25% stenosed. Prox Cx to Mid Cx lesion is 65% stenosed. Mid RCA lesion is 15% stenosed.   Mild to moderate nonobstructive CAD with 20% irregularity in the first diagonal Craig Mccann of the LAD, 20% stenosis in the ramus intermediate vessel, smooth 20% proximal circumflex stenosis with no change in the previously noted 65% mid AV groove circumflex stenosis, and mild 15 and 30% smooth narrowings in a dominant RCA.   No significant change since the prior catheterization of 2019.   LVEDP 15 mmHg.   RECOMMENDATION: Guideline directed medical therapy for nonischemic cardiomyopathy with reduced EF at 30 to 35%.  The patient will return to Dr. Alvan.  Smoking cessation is essential.  Aggressive lipid-lowering therapy with target LDL less than 70     10/2020 echo IMPRESSIONS     1. Left ventricular ejection fraction, by estimation, is 30 to 35%. The  left ventricle has moderately decreased function. The left ventricle  demonstrates global hypokinesis. The left ventricular internal cavity size  was mildly dilated. There is moderate    to severe asymmetric left ventricular hypertrophy of the septal segment.  Left ventricular diastolic parameters are consistent with Grade I  diastolic dysfunction (impaired relaxation).   2. Right ventricular systolic function is  normal. The right ventricular  size is normal. There is normal pulmonary artery systolic pressure. The  estimated right ventricular systolic pressure is 16.2 mmHg.   3. There is a trivial pericardial effusion anterior to the right  ventricle.   4. The mitral valve is abnormal. Trivial mitral valve regurgitation.   5. The aortic valve is tricuspid. There is mild calcification of the  aortic valve. Aortic valve regurgitation is not visualized. Mild aortic  valve sclerosis is present, with no evidence of aortic valve stenosis.   6. The inferior vena cava is normal in size with greater than 50%  respiratory variability, suggesting right atrial pressure of 3 mmHg.      12/2021 cath Dist RCA lesion is 30% stenosed.   Mid RCA lesion is 15% stenosed.   Ramus lesion is 25% stenosed.   Ost 1st Diag lesion is 20% stenosed.   Prox Cx to Mid Cx lesion is 50% stenosed.   LV end diastolic pressure is normal.   Nonobstructive CAD. If anything the lesion in the mid LCx is improved from prior. FFR of this lesion in 2018 was normal.  Normal LVEDP   Plan: I don't see anything from a coronary standpoint that would explain his chest pain symptoms particularly at rest. Would consider noncardiac causes of pain. Continue medical management.     Jan 2024 echo   1. Limited Echo to evaluate for LVEF   2. Left ventricular ejection fraction, by estimation, is 45 to 50%. The  left ventricle has low normal function. The left ventricle has no regional  wall motion abnormalities. The left ventricular internal cavity size was  mildly to moderately dilated.  Left ventricular diastolic parameters are consistent with Grade I  diastolic dysfunction (impaired relaxation). The average left  ventricular  global longitudinal strain is -19.9 %. The global longitudinal strain is  normal.   3. Right ventricular systolic function is normal. 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 Would avoid ACE/ARB/ARNI.  - LVEF has improved, low normal on last check - no symptoms, continue current meds   2. CAD with chronic stable angina - no symptoms, continue current meds   3. Hyperlipidemia - has not tolerated atorva or rosuvastatin  - he is on zetia  - LDL above goal, discussed repatha. He wants to discuss with his wife, would refer to pharmD if he is willing to start  4. CKD - repeat bmet, if not improved or further decline refer to neprhology    F/u 6 months   Dorn PHEBE Ross, M.D.     [1]  Allergies Allergen Reactions   Codeine Hives   Lexiscan  [Regadenoson ] Other (See Comments)    Transient Heart Block   Valium [Diazepam] Anaphylaxis   Farxiga  [Dapagliflozin ] Other (See Comments)    Made pt feel down.    Quinolones     Patient with ATAA (ascending thoracic aortic aneurysm)  [2]  Allergies Allergen Reactions   Codeine Hives   Lexiscan  [Regadenoson ] Other (See Comments)    Transient Heart Block   Valium [Diazepam] Anaphylaxis   Farxiga  [Dapagliflozin ] Other (See Comments)    Made pt feel down.    Quinolones     Patient with ATAA (ascending thoracic aortic aneurysm)   "
